# Patient Record
Sex: Female | Born: 1955 | Race: White | Hispanic: No | Marital: Married | Smoking: Never smoker
Health system: Southern US, Community
[De-identification: ages and names within clinical notes are randomized; demographics above are authoritative.]

## PROBLEM LIST (undated history)

## (undated) DIAGNOSIS — I471 Supraventricular tachycardia, unspecified: Secondary | ICD-10-CM

## (undated) DIAGNOSIS — R202 Paresthesia of skin: Secondary | ICD-10-CM

## (undated) DIAGNOSIS — I480 Paroxysmal atrial fibrillation: Secondary | ICD-10-CM

## (undated) DIAGNOSIS — N644 Mastodynia: Secondary | ICD-10-CM

## (undated) DIAGNOSIS — I10 Essential (primary) hypertension: Secondary | ICD-10-CM

## (undated) DIAGNOSIS — M25512 Pain in left shoulder: Secondary | ICD-10-CM

## (undated) DIAGNOSIS — G5760 Lesion of plantar nerve, unspecified lower limb: Secondary | ICD-10-CM

## (undated) DIAGNOSIS — Z780 Asymptomatic menopausal state: Secondary | ICD-10-CM

## (undated) DIAGNOSIS — M79673 Pain in unspecified foot: Secondary | ICD-10-CM

## (undated) DIAGNOSIS — C44599 Other specified malignant neoplasm of skin of other part of trunk: Secondary | ICD-10-CM

## (undated) DIAGNOSIS — Z9289 Personal history of other medical treatment: Secondary | ICD-10-CM

## (undated) HISTORY — DX: Pain in unspecified foot: M79.673

## (undated) HISTORY — DX: Asymptomatic menopausal state: Z78.0

## (undated) HISTORY — DX: Paroxysmal atrial fibrillation: I48.0

## (undated) HISTORY — DX: Personal history of other medical treatment: Z92.89

## (undated) HISTORY — DX: Pain in left shoulder: M25.512

## (undated) HISTORY — DX: Mastodynia: N64.4

## (undated) HISTORY — DX: Lesion of plantar nerve, unspecified lower limb: G57.60

## (undated) HISTORY — DX: Other specified malignant neoplasm of skin of other part of trunk: C44.599

## (undated) HISTORY — DX: Paresthesia of skin: R20.2

## (undated) HISTORY — DX: Essential (primary) hypertension: I10

## (undated) HISTORY — PX: OTHER SURGICAL HISTORY: SHX169

---

## 1975-05-07 HISTORY — PX: WISDOM TOOTH EXTRACTION: SHX21

## 2005-07-01 ENCOUNTER — Ambulatory Visit: Payer: Self-pay | Admitting: Sports Medicine

## 2005-08-08 ENCOUNTER — Ambulatory Visit: Payer: Self-pay | Admitting: Family Medicine

## 2005-09-16 ENCOUNTER — Ambulatory Visit: Payer: Self-pay | Admitting: Family Medicine

## 2005-09-23 ENCOUNTER — Encounter: Admission: RE | Admit: 2005-09-23 | Discharge: 2005-09-23 | Payer: Self-pay | Admitting: Sports Medicine

## 2005-10-01 ENCOUNTER — Ambulatory Visit: Payer: Self-pay | Admitting: Sports Medicine

## 2005-10-06 ENCOUNTER — Encounter (INDEPENDENT_AMBULATORY_CARE_PROVIDER_SITE_OTHER): Payer: Self-pay | Admitting: *Deleted

## 2005-10-06 LAB — CONVERTED CEMR LAB

## 2005-10-09 ENCOUNTER — Encounter: Payer: Self-pay | Admitting: Sports Medicine

## 2005-10-09 ENCOUNTER — Ambulatory Visit: Payer: Self-pay | Admitting: Sports Medicine

## 2006-01-21 ENCOUNTER — Ambulatory Visit: Payer: Self-pay | Admitting: Sports Medicine

## 2006-10-07 ENCOUNTER — Encounter: Admission: RE | Admit: 2006-10-07 | Discharge: 2006-10-07 | Payer: Self-pay | Admitting: Obstetrics and Gynecology

## 2006-10-09 ENCOUNTER — Encounter: Admission: RE | Admit: 2006-10-09 | Discharge: 2006-10-09 | Payer: Self-pay | Admitting: Sports Medicine

## 2006-10-09 ENCOUNTER — Ambulatory Visit (HOSPITAL_COMMUNITY): Admission: RE | Admit: 2006-10-09 | Discharge: 2006-10-09 | Payer: Self-pay | Admitting: Family Medicine

## 2006-10-09 ENCOUNTER — Encounter: Payer: Self-pay | Admitting: Family Medicine

## 2006-10-09 ENCOUNTER — Ambulatory Visit: Payer: Self-pay | Admitting: Family Medicine

## 2006-10-22 ENCOUNTER — Ambulatory Visit: Payer: Self-pay | Admitting: Sports Medicine

## 2006-10-28 ENCOUNTER — Ambulatory Visit (HOSPITAL_COMMUNITY): Admission: RE | Admit: 2006-10-28 | Discharge: 2006-10-28 | Payer: Self-pay | Admitting: Sports Medicine

## 2006-12-04 ENCOUNTER — Encounter (INDEPENDENT_AMBULATORY_CARE_PROVIDER_SITE_OTHER): Payer: Self-pay | Admitting: *Deleted

## 2006-12-18 ENCOUNTER — Encounter (INDEPENDENT_AMBULATORY_CARE_PROVIDER_SITE_OTHER): Payer: Self-pay | Admitting: *Deleted

## 2007-01-13 ENCOUNTER — Encounter: Payer: Self-pay | Admitting: Sports Medicine

## 2007-01-22 ENCOUNTER — Encounter: Payer: Self-pay | Admitting: Sports Medicine

## 2007-01-23 ENCOUNTER — Encounter: Admission: RE | Admit: 2007-01-23 | Discharge: 2007-01-23 | Payer: Self-pay | Admitting: Specialist

## 2007-02-23 ENCOUNTER — Telehealth: Payer: Self-pay | Admitting: Sports Medicine

## 2007-02-24 ENCOUNTER — Encounter: Payer: Self-pay | Admitting: Sports Medicine

## 2007-03-11 ENCOUNTER — Telehealth: Payer: Self-pay | Admitting: Sports Medicine

## 2007-07-12 ENCOUNTER — Telehealth (INDEPENDENT_AMBULATORY_CARE_PROVIDER_SITE_OTHER): Payer: Self-pay | Admitting: *Deleted

## 2007-08-04 ENCOUNTER — Telehealth: Payer: Self-pay | Admitting: *Deleted

## 2007-08-06 ENCOUNTER — Encounter: Admission: RE | Admit: 2007-08-06 | Discharge: 2007-08-06 | Payer: Self-pay | Admitting: Sports Medicine

## 2007-10-07 LAB — CONVERTED CEMR LAB: Pap Smear: NORMAL

## 2007-10-11 ENCOUNTER — Encounter: Admission: RE | Admit: 2007-10-11 | Discharge: 2007-10-11 | Payer: Self-pay | Admitting: Sports Medicine

## 2007-11-24 ENCOUNTER — Inpatient Hospital Stay (HOSPITAL_COMMUNITY): Admission: AD | Admit: 2007-11-24 | Discharge: 2007-11-24 | Payer: Self-pay | Admitting: Gynecology

## 2007-11-25 ENCOUNTER — Inpatient Hospital Stay (HOSPITAL_COMMUNITY): Admission: AD | Admit: 2007-11-25 | Discharge: 2007-11-25 | Payer: Self-pay | Admitting: Gynecology

## 2007-11-26 ENCOUNTER — Inpatient Hospital Stay (HOSPITAL_COMMUNITY): Admission: AD | Admit: 2007-11-26 | Discharge: 2007-11-26 | Payer: Self-pay | Admitting: Gynecology

## 2008-04-04 ENCOUNTER — Ambulatory Visit (HOSPITAL_COMMUNITY): Admission: RE | Admit: 2008-04-04 | Discharge: 2008-04-04 | Payer: Self-pay | Admitting: Obstetrics and Gynecology

## 2008-04-08 ENCOUNTER — Other Ambulatory Visit: Payer: Self-pay | Admitting: Emergency Medicine

## 2008-04-08 ENCOUNTER — Inpatient Hospital Stay (HOSPITAL_COMMUNITY): Admission: AD | Admit: 2008-04-08 | Discharge: 2008-04-09 | Payer: Self-pay | Admitting: Obstetrics and Gynecology

## 2008-06-06 HISTORY — PX: POLYPECTOMY: SHX149

## 2008-07-06 ENCOUNTER — Other Ambulatory Visit: Payer: Self-pay | Admitting: Obstetrics and Gynecology

## 2008-07-07 ENCOUNTER — Encounter (INDEPENDENT_AMBULATORY_CARE_PROVIDER_SITE_OTHER): Payer: Self-pay | Admitting: Obstetrics and Gynecology

## 2008-07-07 ENCOUNTER — Inpatient Hospital Stay (HOSPITAL_COMMUNITY): Admission: AD | Admit: 2008-07-07 | Discharge: 2008-07-10 | Payer: Self-pay | Admitting: Obstetrics and Gynecology

## 2008-07-11 ENCOUNTER — Encounter: Admission: RE | Admit: 2008-07-11 | Discharge: 2008-08-10 | Payer: Self-pay | Admitting: Obstetrics and Gynecology

## 2008-08-11 ENCOUNTER — Encounter: Admission: RE | Admit: 2008-08-11 | Discharge: 2008-09-09 | Payer: Self-pay | Admitting: Obstetrics and Gynecology

## 2008-09-05 HISTORY — PX: PARS PLANA VITRECTOMY W/ REPAIR OF MACULAR HOLE: SHX2170

## 2008-09-10 ENCOUNTER — Encounter: Admission: RE | Admit: 2008-09-10 | Discharge: 2008-10-10 | Payer: Self-pay | Admitting: Obstetrics and Gynecology

## 2008-10-11 ENCOUNTER — Encounter: Admission: RE | Admit: 2008-10-11 | Discharge: 2008-11-10 | Payer: Self-pay | Admitting: Obstetrics and Gynecology

## 2008-11-06 HISTORY — PX: CATARACT EXTRACTION: SUR2

## 2008-11-11 ENCOUNTER — Encounter: Admission: RE | Admit: 2008-11-11 | Discharge: 2008-12-08 | Payer: Self-pay | Admitting: Obstetrics and Gynecology

## 2008-12-09 ENCOUNTER — Encounter: Admission: RE | Admit: 2008-12-09 | Discharge: 2009-01-08 | Payer: Self-pay | Admitting: Obstetrics and Gynecology

## 2009-01-09 ENCOUNTER — Encounter: Admission: RE | Admit: 2009-01-09 | Discharge: 2009-02-07 | Payer: Self-pay | Admitting: Obstetrics and Gynecology

## 2009-01-15 ENCOUNTER — Ambulatory Visit: Payer: Self-pay | Admitting: Internal Medicine

## 2009-01-15 DIAGNOSIS — R000 Tachycardia, unspecified: Secondary | ICD-10-CM | POA: Insufficient documentation

## 2009-01-15 DIAGNOSIS — R209 Unspecified disturbances of skin sensation: Secondary | ICD-10-CM | POA: Insufficient documentation

## 2009-01-15 DIAGNOSIS — M79609 Pain in unspecified limb: Secondary | ICD-10-CM | POA: Insufficient documentation

## 2009-01-15 DIAGNOSIS — R238 Other skin changes: Secondary | ICD-10-CM | POA: Insufficient documentation

## 2009-01-15 DIAGNOSIS — I10 Essential (primary) hypertension: Secondary | ICD-10-CM | POA: Insufficient documentation

## 2009-01-21 LAB — CONVERTED CEMR LAB
ALT: 19 units/L (ref 0–35)
AST: 23 units/L (ref 0–37)
Albumin: 4.1 g/dL (ref 3.5–5.2)
Alkaline Phosphatase: 65 units/L (ref 39–117)
BUN: 13 mg/dL (ref 6–23)
Basophils Absolute: 0 10*3/uL (ref 0.0–0.1)
Basophils Relative: 0.2 % (ref 0.0–3.0)
Bilirubin, Direct: 0.2 mg/dL (ref 0.0–0.3)
CO2: 30 meq/L (ref 19–32)
Calcium: 9.2 mg/dL (ref 8.4–10.5)
Chloride: 109 meq/L (ref 96–112)
Cholesterol: 204 mg/dL — ABNORMAL HIGH (ref 0–200)
Creatinine, Ser: 0.8 mg/dL (ref 0.4–1.2)
Direct LDL: 127.1 mg/dL
Eosinophils Absolute: 0.1 10*3/uL (ref 0.0–0.7)
Eosinophils Relative: 2.3 % (ref 0.0–5.0)
Folate: 18.1 ng/mL
Free T4: 0.6 ng/dL (ref 0.6–1.6)
GFR calc non Af Amer: 79.81 mL/min (ref >60)
Glucose, Bld: 91 mg/dL (ref 70–99)
HCT: 41.7 % (ref 36.0–46.0)
HDL: 45.6 mg/dL (ref >39.00)
Hemoglobin: 14.3 g/dL (ref 12.0–15.0)
Hgb A1c MFr Bld: 5.7 % (ref 4.6–6.5)
Lymphocytes Relative: 29 % (ref 12.0–46.0)
Lymphs Abs: 1.5 10*3/uL (ref 0.7–4.0)
MCHC: 34.3 g/dL (ref 30.0–36.0)
MCV: 92.5 fL (ref 78.0–100.0)
Monocytes Absolute: 0.4 10*3/uL (ref 0.1–1.0)
Monocytes Relative: 8.1 % (ref 3.0–12.0)
Neutro Abs: 3.2 10*3/uL (ref 1.4–7.7)
Neutrophils Relative %: 60.4 % (ref 43.0–77.0)
Platelets: 209 10*3/uL (ref 150.0–400.0)
Potassium: 3.9 meq/L (ref 3.5–5.1)
RBC: 4.51 M/uL (ref 3.87–5.11)
RDW: 13.1 % (ref 11.5–14.6)
Sed Rate: 12 mm/hr (ref 0–22)
Sodium: 145 meq/L (ref 135–145)
TSH: 2.02 microintl units/mL (ref 0.35–5.50)
Total Bilirubin: 0.8 mg/dL (ref 0.3–1.2)
Total CHOL/HDL Ratio: 4
Total Protein: 7.5 g/dL (ref 6.0–8.3)
Triglycerides: 150 mg/dL — ABNORMAL HIGH (ref 0.0–149.0)
VLDL: 30 mg/dL (ref 0.0–40.0)
Vitamin B-12: 575 pg/mL (ref 211–911)
WBC: 5.2 10*3/uL (ref 4.5–10.5)

## 2009-01-30 ENCOUNTER — Ambulatory Visit: Payer: Self-pay | Admitting: Internal Medicine

## 2009-01-30 ENCOUNTER — Other Ambulatory Visit: Admission: RE | Admit: 2009-01-30 | Discharge: 2009-01-30 | Payer: Self-pay | Admitting: Internal Medicine

## 2009-01-30 ENCOUNTER — Encounter: Payer: Self-pay | Admitting: Internal Medicine

## 2009-01-31 ENCOUNTER — Encounter: Payer: Self-pay | Admitting: Internal Medicine

## 2009-02-08 ENCOUNTER — Encounter: Admission: RE | Admit: 2009-02-08 | Discharge: 2009-03-08 | Payer: Self-pay | Admitting: Obstetrics and Gynecology

## 2009-02-21 ENCOUNTER — Telehealth: Payer: Self-pay | Admitting: Internal Medicine

## 2009-07-01 ENCOUNTER — Encounter: Payer: Self-pay | Admitting: Internal Medicine

## 2009-07-05 ENCOUNTER — Telehealth: Payer: Self-pay | Admitting: Internal Medicine

## 2009-07-05 ENCOUNTER — Encounter: Payer: Self-pay | Admitting: *Deleted

## 2009-08-16 ENCOUNTER — Telehealth: Payer: Self-pay | Admitting: Internal Medicine

## 2009-08-17 ENCOUNTER — Ambulatory Visit: Payer: Self-pay | Admitting: Internal Medicine

## 2009-08-17 DIAGNOSIS — Z780 Asymptomatic menopausal state: Secondary | ICD-10-CM | POA: Insufficient documentation

## 2009-08-17 DIAGNOSIS — N644 Mastodynia: Secondary | ICD-10-CM | POA: Insufficient documentation

## 2009-08-24 ENCOUNTER — Encounter: Admission: RE | Admit: 2009-08-24 | Discharge: 2009-08-24 | Payer: Self-pay | Admitting: Internal Medicine

## 2009-08-27 ENCOUNTER — Encounter: Admission: RE | Admit: 2009-08-27 | Discharge: 2009-08-27 | Payer: Self-pay | Admitting: Internal Medicine

## 2009-08-27 ENCOUNTER — Encounter (INDEPENDENT_AMBULATORY_CARE_PROVIDER_SITE_OTHER): Payer: Self-pay | Admitting: *Deleted

## 2009-09-19 ENCOUNTER — Telehealth: Payer: Self-pay | Admitting: *Deleted

## 2010-01-11 ENCOUNTER — Ambulatory Visit: Payer: Self-pay | Admitting: Internal Medicine

## 2010-01-11 DIAGNOSIS — M25519 Pain in unspecified shoulder: Secondary | ICD-10-CM | POA: Insufficient documentation

## 2010-01-11 LAB — CONVERTED CEMR LAB
Bilirubin Urine: NEGATIVE
Blood in Urine, dipstick: NEGATIVE
Glucose, Urine, Semiquant: NEGATIVE
Ketones, urine, test strip: NEGATIVE
Nitrite: NEGATIVE
Protein, U semiquant: NEGATIVE
Specific Gravity, Urine: 1.015
Urobilinogen, UA: 0.2
WBC Urine, dipstick: NEGATIVE
pH: 7

## 2010-01-14 LAB — CONVERTED CEMR LAB
ALT: 19 units/L (ref 0–35)
AST: 20 units/L (ref 0–37)
Albumin: 4.2 g/dL (ref 3.5–5.2)
Alkaline Phosphatase: 68 units/L (ref 39–117)
BUN: 13 mg/dL (ref 6–23)
Basophils Absolute: 0 10*3/uL (ref 0.0–0.1)
Basophils Relative: 0.4 % (ref 0.0–3.0)
Bilirubin, Direct: 0 mg/dL (ref 0.0–0.3)
CO2: 30 meq/L (ref 19–32)
Calcium: 9.3 mg/dL (ref 8.4–10.5)
Chloride: 106 meq/L (ref 96–112)
Cholesterol: 212 mg/dL — ABNORMAL HIGH (ref 0–200)
Creatinine, Ser: 0.7 mg/dL (ref 0.4–1.2)
Direct LDL: 149.6 mg/dL
Eosinophils Absolute: 0.1 10*3/uL (ref 0.0–0.7)
Eosinophils Relative: 2.5 % (ref 0.0–5.0)
GFR calc non Af Amer: 92.75 mL/min (ref >60)
Glucose, Bld: 91 mg/dL (ref 70–99)
HCT: 43 % (ref 36.0–46.0)
HDL: 51.4 mg/dL (ref >39.00)
Hemoglobin: 14.4 g/dL (ref 12.0–15.0)
Lymphocytes Relative: 34.3 % (ref 12.0–46.0)
Lymphs Abs: 1.7 10*3/uL (ref 0.7–4.0)
MCHC: 33.5 g/dL (ref 30.0–36.0)
MCV: 91.4 fL (ref 78.0–100.0)
Monocytes Absolute: 0.4 10*3/uL (ref 0.1–1.0)
Monocytes Relative: 9 % (ref 3.0–12.0)
Neutro Abs: 2.6 10*3/uL (ref 1.4–7.7)
Neutrophils Relative %: 53.8 % (ref 43.0–77.0)
Platelets: 215 10*3/uL (ref 150.0–400.0)
Potassium: 4.3 meq/L (ref 3.5–5.1)
RBC: 4.7 M/uL (ref 3.87–5.11)
RDW: 14 % (ref 11.5–14.6)
Sodium: 142 meq/L (ref 135–145)
TSH: 1.97 microintl units/mL (ref 0.35–5.50)
Total Bilirubin: 0.5 mg/dL (ref 0.3–1.2)
Total CHOL/HDL Ratio: 4
Total Protein: 7.4 g/dL (ref 6.0–8.3)
Triglycerides: 100 mg/dL (ref 0.0–149.0)
VLDL: 20 mg/dL (ref 0.0–40.0)
WBC: 4.9 10*3/uL (ref 4.5–10.5)

## 2010-01-21 ENCOUNTER — Encounter: Payer: Self-pay | Admitting: Internal Medicine

## 2010-01-23 ENCOUNTER — Encounter: Payer: Self-pay | Admitting: Internal Medicine

## 2010-01-30 ENCOUNTER — Ambulatory Visit: Payer: Self-pay | Admitting: Internal Medicine

## 2010-01-30 ENCOUNTER — Other Ambulatory Visit: Admission: RE | Admit: 2010-01-30 | Discharge: 2010-01-30 | Payer: Self-pay | Admitting: Internal Medicine

## 2010-01-30 DIAGNOSIS — G5760 Lesion of plantar nerve, unspecified lower limb: Secondary | ICD-10-CM | POA: Insufficient documentation

## 2010-02-01 ENCOUNTER — Telehealth: Payer: Self-pay | Admitting: Internal Medicine

## 2010-02-04 LAB — CONVERTED CEMR LAB: Pap Smear: NEGATIVE

## 2010-02-06 ENCOUNTER — Ambulatory Visit: Payer: Self-pay | Admitting: Cardiology

## 2010-02-06 ENCOUNTER — Ambulatory Visit: Payer: Self-pay

## 2010-02-06 ENCOUNTER — Ambulatory Visit: Payer: Self-pay | Admitting: Internal Medicine

## 2010-03-05 ENCOUNTER — Telehealth: Payer: Self-pay | Admitting: *Deleted

## 2010-04-02 ENCOUNTER — Ambulatory Visit: Payer: Self-pay | Admitting: Internal Medicine

## 2010-04-02 DIAGNOSIS — M26629 Arthralgia of temporomandibular joint, unspecified side: Secondary | ICD-10-CM | POA: Insufficient documentation

## 2010-04-09 ENCOUNTER — Telehealth: Payer: Self-pay | Admitting: Internal Medicine

## 2010-07-09 ENCOUNTER — Ambulatory Visit (HOSPITAL_COMMUNITY): Admission: RE | Admit: 2010-07-09 | Discharge: 2010-07-09 | Payer: Self-pay | Admitting: Obstetrics and Gynecology

## 2010-07-10 ENCOUNTER — Encounter: Payer: Self-pay | Admitting: Internal Medicine

## 2010-07-19 ENCOUNTER — Telehealth: Payer: Self-pay | Admitting: *Deleted

## 2010-07-23 ENCOUNTER — Ambulatory Visit: Payer: Self-pay | Admitting: Internal Medicine

## 2010-08-05 ENCOUNTER — Telehealth: Payer: Self-pay | Admitting: *Deleted

## 2010-08-06 ENCOUNTER — Telehealth: Payer: Self-pay | Admitting: *Deleted

## 2010-08-23 ENCOUNTER — Telehealth: Payer: Self-pay | Admitting: *Deleted

## 2010-08-26 ENCOUNTER — Encounter: Admission: RE | Admit: 2010-08-26 | Discharge: 2010-08-26 | Payer: Self-pay | Admitting: Internal Medicine

## 2010-08-27 ENCOUNTER — Encounter: Payer: Self-pay | Admitting: Internal Medicine

## 2010-08-27 ENCOUNTER — Encounter: Payer: Self-pay | Admitting: *Deleted

## 2010-11-05 NOTE — Progress Notes (Signed)
Summary: LTR OF CLEARANCE  Phone Note Other Incoming Call back at [phone]   Call placed by: AISHA @ [facility] Summary of Call: NEED LETTER OF MEDICAL CLEARANCE FROM [facility]  Follow-up for Phone Call        faxed to [phone] Follow-up by: Lillia Pauls CMA,  July 14, 2007 9:15 AM

## 2010-11-05 NOTE — Progress Notes (Signed)
Summary: Needs a note saying that it's okay to get pg  Phone Note From Other Clinic   Caller: [facility] in [place] [employer] of Call: Pt needs to have a short note saying that it is okay for her to get preg. They also need a prolactin level as well as a urine GC/Chly test done. I spoke to pt and she said that Dr. Enid Skeens office has already done this and they will be faxing all this to [place] as well as to them. So all we need is just the short note about her being okay to get pg. Initial call taken by: Romualdo Bolk, CMA Duncan Dull),  August 23, 2010 11:31 AM  Follow-up for Phone Call        ok   see letter to send Follow-up by: Madelin Headings MD,  August 27, 2010 1:03 PM  Additional Follow-up for Phone Call Additional follow up Details #1::        Note faxed Additional Follow-up by: Romualdo Bolk, CMA Duncan Dull),  August 27, 2010 1:44 PM

## 2010-11-05 NOTE — Progress Notes (Signed)
Summary: triage  Phone Note Call from Patient Call back at Home Phone [phone]   Reason for Call: Talk to Nurse Summary of Call: pt is requesting to speak with someone re: a test she thinks she needs to have done. she sts she is getting ready for invetro and now her breast are sore and one of her doctors advised her to have an ultrasonography, please advise? Initial call taken by: ERIN LEVAN,  August 04, 2007 3:41 PM  Follow-up for Phone Call        IVF planned with a clinic in [place]. Has been on multiple meds to prep her for this. Suspended meds pending a test to check out tender breasts as it may be a negative effect of the meds she has been on.  Had a mammogram in 1/08. C/o tender breasts. IVF doctor wants her to have an ultrasonography has been to place locally for last mammogram. she will call them to get it arranged. wants it Thursday 10/30. if they cannot accomodate her she will try [facility] or [place]. If the mammogram places need an order, told her to have the md who asked her to get it, fax or call in an order for it. Pt agreed with plan and will call to arrange appt Follow-up by: Golden Circle RN,  August 04, 2007 3:46 PM  Additional Follow-up for Phone Call Additional follow up Details #1::        needs an order from md faxed to [phone] at [facility] for a  "diagnostic mammogram" wants this asap so she can get it done Friday Additional Follow-up by: Golden Circle RN,  August 04, 2007 4:48 PM    Additional Follow-up for Phone Call Additional follow up Details #2::    told her md could not write order for diagnostic. Has not seen her recently & has no record of a problem. can get a regular mammogram & then if there are findings, she can get a diagnostic. She states she called the place & was able to convince them to do a diagnostic Friday afternoon w/o a doctor order. Follow-up by: Golden Circle RN,  August 05, 2007 9:09 AM

## 2010-11-05 NOTE — Progress Notes (Signed)
Summary: needs EKG and Stress Test  Phone Note Call from Patient   Summary of Call: Pt needs Stress test and EKG to finish up her In the [facility]ertility Program..........Marland KitchenNEEDS it ASAP as she is attempting to get an egg from the surrogate that carried her last child and they would be biological siblings.  Surrogate only has one more cycle to do attempt this. [phone] Initial call taken by: Lynann Beaver CMA,  February 01, 2010 2:13 PM  Follow-up for Phone Call        Spoke to pt and she said that she just needed a treadmill test. Pt to come in on Wed for EKG and order sent for Stress test to [facility]. Follow-up by: Romualdo Bolk, CMA Duncan Dull),  February 01, 2010 2:29 PM

## 2010-11-05 NOTE — Progress Notes (Signed)
Summary: pt req copy of mammogram and pap. Also wants to order bloodwork  Phone Note Call from Patient Call back at [facility] Phone [phone]   Caller: Patient Summary of Call: Pt req copy of mammogram and pap. Please fax to pt [phone]. Pt also would like to req to get a hormone lvl check (bloodwork) including estradol, progesterone,LH and Beta Hcg. Please advise.  Initial call taken by: Lucy Antigua,  September 19, 2009 3:29 PM  Follow-up for Phone Call        copy ofpap and mammo to her. also need a reason or diagnosis code for reason for the labs  to be done . please call and get more info. Follow-up by: Madelin Headings MD,  September 20, 2009 8:18 AM  Additional Follow-up for Phone Call Additional follow up Details #1::        Pt states that she got the labs done thru her gyn. Pt would like this faxed to Dr. Ilda Foil Fax [phone].  Info faxed to pt and md. Additional Follow-up by: Romualdo Bolk, CMA (AAMA),  September 21, 2009 10:17 AM
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
## 2010-11-05 NOTE — Assessment & Plan Note (Signed)
Summary: shoulder and arm pain /dm   Vital Signs:  Patient profile:   55 year old female Menstrual status:  postmenopausal Height:      66.75 inches Weight:      216 pounds BMI:     34.21 Pulse rate:   78 / minute BP sitting:   120 / 80  (right arm) Cuff size:   regular  Vitals Entered By: Romualdo Bolk, CMA (AAMA) (January 11, 2010 10:58 AM) CC: Left shoulder pain that has sharp pains when she moves it a certain way. This has been going on for 2-3 weeks. During the daytime it is uncomfortable but hurts worse at night. Pt wanting to go off labetalol since she is going to try to get preg. Pt has a slightly abnormal ana. She would like a referal to rheum. She does have a slight bulgling disc in her back.   History of Present Illness: Alison Matthews comesin today for     above acute problem but wants to schedule  cpx while here.   She is having left shoulder pain and radiation to arm for  weeks  . doesnt want to be   Ignoring  a serious  problem  .  Does lift her   44 month  old child  angela  with that arm but had    onset after reacing back in car seat . Was sore and now getting better  but persistent . NO sig weakness .  She is now  taking     a week off to  go to a spa  to considering  getting pregnant again.       in the early summer .   TOre rotater cuff   4 years ago on the right    And was rx with physical therapy . and then got better with time and therapy  Context  ache   all the time  but at night is worse    on side  pain from pain.     AN shooting pains   and ha No asociated cp sob NVsweatting . No exercise intolerance limited but various aches and painsl OTher improtant facts  Neurologist : DX in the past   slight positive ana    monitoring.      felt not important. ? amyloid.  No evidence of such . MRI   bulging  disc in back and neg ncs   .   But has difficulty with ball of foot   .POss mortons   has used padding in shoe still a problem.  Preventive  Screening-Counseling & Management  Alcohol-Tobacco     Alcohol drinks/day: 0     Smoking Status: never  Caffeine-Diet-Exercise     Caffeine use/day: 1 every 3 days     Does Patient Exercise: no  Current Medications (verified): 1)  Labetalol Hcl 200 Mg Tabs (Labetalol Hcl) .... 1/2 By Mouth Two Times A Day 2)  Motrin Ib 200 Mg Tabs (Ibuprofen)  Allergies (verified): No Known Drug Allergies  Past History:  Past medical, surgical, family and social histories (including risk factors) reviewed, and no changes noted (except as noted below).  Past Medical History: Reviewed history from 01/15/2009 and no changes required. Postmenopausal- Used Egg Donor and hormonal therapy for child G1P1  Hypertension- Pregnancy related Had First Child at age 54   10/2 /09    Past Surgical History: Reviewed history from 01/15/2009 and no changes required. 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 Henderson Cloud, [place]  Ophthalmology:[facility]   Orthopedics: [facility]  Family History: Reviewed history from 01/15/2009 and no changes required. 3 younger sisters A&W, father age 82 ? Hbp, mother age 78 osteoporosis Father: HBP, arthristis Mother:  Osteoporosis Siblings: 3 younger sister- one had bx in breast others healthy.  Social History: Reviewed history from 01/15/2009 and no changes required. consultant with CPA background; married dennis Mangham - Psychologist, forensic; non smoker; rare alcohol. Daughter Marylene Land  born in October 09  Sleep  deprived.  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   Child now is 18 months and gets help with a nanny  Planning another egg donor post menopausal pregnancy.  Orig from [place]  moved from [place]  4 years ago.   Review of Systems  The patient denies anorexia, fever, weight loss,  vision loss, decreased hearing, hoarseness, chest pain, syncope, dyspnea on exertion, peripheral edema, prolonged cough, abdominal pain, melena, hematochezia, severe indigestion/heartburn, muscle weakness, transient blindness, difficulty walking, unusual weight change, abnormal bleeding, enlarged lymph nodes, and angioedema.    Physical Exam  General:  Well-developed,well-nourished,in no acute distress; alert,appropriate and cooperative throughout examination Head:  normocephalic and atraumatic.   Neck:  No deformities, masses, or tenderness noted. Chest Wall:  no tenderness.   Lungs:  Normal respiratory effort, chest expands symmetrically. Lungs are clear to auscultation, no crackles or wheezes. Heart:  Normal rate and regular rhythm. S1 and S2 normal without gallop, murmur, click, rub or other extra sounds.no lifts.   Abdomen:  Bowel sounds positive,abdomen soft and non-tender without masses, organomegaly or noted. Msk:  no joint warmth and no redness over joints.   left shoulder  with discomfort intern and exst rotation.   ball of foot without callus  no point tenderness  Pulses:  pulses intact without delay   Extremities:  no clubbing cyanosis or edema  Neurologic:  alert & oriented X3, strength normal in all extremities, and gait normal.   Skin:  turgor normal, no ecchymoses, and no petechiae.   Cervical Nodes:  no anterior cervical adenopathy and no posterior cervical adenopathy.   Axillary Nodes:  no L axillary adenopathy.   Psych:  Oriented X3, good eye contact, not anxious appearing, and not depressed appearing.     Impression & Recommendations:  Problem # 1:  SHOULDER PAIN, LEFT (ICD-719.41) this does not appear cardiac but MS in nature   with onset context and aggravating factors .    disc rx and Expectant management  Her updated medication list for this problem includes:    Motrin Ib 200 Mg Tabs (Ibuprofen)    Naproxen 500 Mg Tabs (Naproxen) .Marland Kitchen... 1 by mouth two times a day  for  shoulder pain.  Problem # 2:  HYPERTENSION (ICD-401.9) ok today but  disc nsaid and other factors such as weight age inactivity .  Her updated medication list for this problem includes:    Labetalol Hcl 200 Mg Tabs (Labetalol hcl) .Marland Kitchen... 1/2 by mouth two times a day  BP today: 120/80 Prior BP: 110/70 (08/17/2009)  Prior 10 Yr Risk Heart Disease: Not enough information (08/17/2009)  Labs Reviewed: K+: 3.9 (01/15/2009) Creat: : 0.8 (01/15/2009)   Chol: 204 (01/15/2009)   HDL: 45.60 (01/15/2009)   TG: 150.0 (01/15/2009)  Problem # 3:  ASYMPTOMATIC POSTMENOPAUSAL STATUS (ICD-V49.81) she is planning  to undergo  another pregnancy    agree that she should lose weight  her current ortho signs will get worse with preg and  and her BP could be  problematic and lifestyle intervention could help.   Complete Medication List: 1)  Labetalol Hcl 200 Mg Tabs (Labetalol hcl) .... 1/2 by mouth two times a day 2)  Motrin Ib 200 Mg Tabs (Ibuprofen) 3)  Naproxen 500 Mg Tabs (Naproxen) .Marland Kitchen.. 1 by mouth two times a day for  shoulder pain.  Other Orders: Venipuncture (95621) UA Dipstick w/o Micro (automated)  (81003) TLB-Lipid Panel (80061-LIPID) TLB-Hepatic/Liver Function Pnl (80076-HEPATIC) TLB-TSH (Thyroid Stimulating Hormone) (84443-TSH) TLB-CBC Platelet - w/Differential (85025-CBCD) TLB-BMP (Basic Metabolic Panel-BMET) (80048-METABOL)  Patient Instructions: 1)  NSAID   three times a day for up to 3 weeks for your shoulder . 2)   I think this is tendonitis bursitis.  3)  Continue BP   meds  for now    untill off med for shoulder  before stopping. 4)  Consider seeing podiatry  Dr Alison Murray  for feet and shoulder  Consider seeing Dr Rennis Chris  .  Prescriptions: NAPROXEN 500 MG TABS (NAPROXEN) 1 by mouth two times a day for  shoulder pain.  #60 x 1   Entered and Authorized by:   Madelin Headings MD   Signed by:   Madelin Headings MD on 01/11/2010   Method used:   Electronically to         [facility]. [phone]* (retail)       [address]       [place], [place]  [address]       Ph: [phone]       Fax: [phone]   RxID:   [id number]   Laboratory Results   Urine Tests    Routine Urinalysis   Color: yellow Appearance: Clear Glucose: negative   (Normal Range: Negative) Bilirubin: negative   (Normal Range: Negative) Ketone: negative   (Normal Range: Negative) Spec. Gravity: 1.015   (Normal Range: 1.003-1.035) Blood: negative   (Normal Range: Negative) pH: 7.0   (Normal Range: 5.0-8.0) Protein: negative   (Normal Range: Negative) Urobilinogen: 0.2   (Normal Range: 0-1) Nitrite: negative   (Normal Range: Negative) Leukocyte Esterace: negative   (Normal Range: Negative)    Comments: Rita Ohara  January 11, 2010 12:48 PM

## 2010-11-05 NOTE — Letter (Signed)
Summary: Generic Letter  [facility]  [address] [place], [place] [address]   Phone: [phone]  Fax: [phone]    08/27/2010   Alison Matthews  DOB: 2056-08-17  To whom it may concern:  [name]  is a patient in our practice. There are no medical contraindications to pregnancy.  Her last visit/evaluation  with [place] was in June 2011.  Let us know if you require further information.             Sincerely,   Berniece Andreas MD

## 2010-11-05 NOTE — Miscellaneous (Signed)
Summary: Flu Vaccination/[facility]  Flu Vaccination/[facility]   Imported By: Maryln Gottron 07/12/2009 13:00:39  _____________________________________________________________________  External Attachment:    Type:   Image     Comment:   External Document

## 2010-11-05 NOTE — Assessment & Plan Note (Signed)
Summary: ekg only/ssc  Nurse Visit  CC: Pt came in for EKG only EKG normal sinus rhythm Nl intervals and nl axis.  WPanosh MD   Allergies: No Known Drug Allergies  Orders Added: 1)  EKG w/ Interpretation [93000]

## 2010-11-05 NOTE — Progress Notes (Signed)
Summary: orders for dexa  Phone Note Call from Patient   Caller: Patient Call For: Madelin Headings MD Summary of Call: Tenderness right side of breast and wants a diagnostic mammogram.  [facility].  also wants a Bone Density. Needs faxed orders.......  [phone] pt. [phone] Initial call taken by: Lynann Beaver CMA,  August 16, 2009 12:50 PM  Follow-up for Phone Call        needs ov   to check breast before can order a diagnostic mammo  can see  her tomorrow( any slot)  if she can come. Follow-up by: Madelin Headings MD,  August 16, 2009 4:32 PM  Additional Follow-up for Phone Call Additional follow up Details #1::        [facility] OV 08-17-2009 945AM Additional Follow-up by: Heron Sabins,  August 16, 2009 5:12 PM

## 2010-11-05 NOTE — Letter (Signed)
Summary: *Consult Note  [facility]  [address]   [place], [place] [address]   Phone: [phone]  Fax:     Re:    Alison Matthews DOB:    [birth year]   Dear Dr. Joelene Millin:   Thank you for requesting that we see the above patient for consultation.  A copy of the detailed office note will be sent under separate cover, for your review.  Evaluation today is consistent with: No serious cardiac disease.  Patient had full cardiac evaluation including an ETT by me which was unremarkable.  A cardiac evaluation by Dr. Peter Swaziland from our Cardiology service.  While her EKG shows minor prolongation of the QT segment this is a normal variant in up to 30% of individuals. We found no evidence of intrinsic heart disease that would preclude her from undergoing fertility treatment or other medical procedures.      Thank you for this consultation.  If you have any further questions regarding the care of this patient, please do not hesitate to contact me @ [phone].  Thank you for this opportunity to look after your patient.  Sincerely,      Enid Baas MD

## 2010-11-05 NOTE — Assessment & Plan Note (Signed)
Summary: cpx/ssc   Vital Signs:  Patient profile:   55 year old female Menstrual status:  postmenopausal Height:      66.5 inches Weight:      213 pounds Pulse rate:   72 / minute BP sitting:   110 / 70  (left arm) Cuff size:   large  Vitals Entered By: Romualdo Bolk, CMA (AAMA) (January 30, 2010 9:12 AM) CC: CPX with pap   History of Present Illness: Alison Matthews comesin comes in today  for preventive visit  in preparation  for possible  post menopausal  pregnancy .    Since last visit  here  there have been no major changes in health status  . Wants pap done here  instead of gyne.   Shoulder : helped by naprosyn  and saw  DR Supple  and  to follow up .  1/10   and  to do self   exercise  at home  and using trainer.    and saw dr Legrand Pitts dx mortons neuroma  and had  injection.    Feeling quite well after vaction. No cv pulm signs and exercising. BP has been good .       Preventive Care Screening  Prior Values:    Pap Smear:  NEGATIVE FOR INTRAEPITHELIAL LESIONS OR MALIGNANCY. (01/30/2009)    Mammogram:  BI-RADS CATEGORY 1:  Negative.^MM DIGITAL DIAGNOSTIC BILAT (08/24/2009)    Colonoscopy:  normal (10/06/2006)    Last Tetanus Booster:  Tdap (01/15/2009)   Preventive Screening-Counseling & Management  Alcohol-Tobacco     Alcohol drinks/day: 0     Smoking Status: never  Caffeine-Diet-Exercise     Caffeine use/day: 1 every 3 days     Does Patient Exercise: no  Hep-HIV-STD-Contraception     Dental Visit-last 6 months yes     Sun Exposure-Excessive: yes  Safety-Violence-Falls     Seat Belt Use: yes     Firearms in the Home: firearms in the home     Firearm Counseling: to practice firearm safety     Smoke Detectors: yes  Comments:  counseled  on no tanning  beds . Pt says she will think a bout it . " feels healthier with it"      Blood Transfusions:  no.    Current Medications (verified): 1)  Labetalol Hcl 200 Mg Tabs (Labetalol Hcl) .... 1/2 By Mouth  Two Times A Day 2)  Naproxen 500 Mg Tabs (Naproxen) .Marland Kitchen.. 1 By Mouth Two Times A Day For  Shoulder Pain. 3)  Prenatal Vitamins 0.8 Mg Tabs (Prenatal Multivit-Min-Fe-Fa)  Allergies (verified): No Known Drug Allergies  Past History:  Past medical, surgical, family and social histories (including risk factors) reviewed, and no changes noted (except as noted below).  Past Medical History: Postmenopausal- Used Egg Donor and hormonal therapy for child G1P1  Hypertension- Pregnancy related Had First Child at age 70   10/2 /09 Vision  left eye field cut.    Past Surgical History: Reviewed history from 01/15/2009 and no changes required. 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 Henderson Cloud, [facility]  Ophthalmology:[facility]   Orthopedics: [facility],  Podiatry: Dr. Forde Radon  Family History: Reviewed history from 01/15/2009 and no changes required. 3 younger sisters A&W, father age 89 ? Hbp, mother age 25 osteoporosis Father: HBP, arthristis Mother:  Osteoporosis Siblings: 3 younger  sister- one had bx in breast others healthy.  Social History: Reviewed history from 01/11/2010 and no changes required. consultant with CPA background; married dennis Amis - Psychologist, forensic; non smoker; rare alcohol. Daughter Marylene Land  born in October 09  home office  and had a Social worker. to help with child.   Child now is 18 months and gets help with a nanny  Planning another egg donor post menopausal pregnancy.  Orig from [place]  moved from [place]  4 years ago. Dental Care w/in 6 mos.:  yes Sun Exposure-Excessive:  yes Seat Belt Use:  yes Blood Transfusions:  no  Review of Systems  The patient denies anorexia, fever, weight loss, weight gain, decreased hearing, hoarseness, chest pain, syncope, dyspnea on exertion, peripheral edema,  prolonged cough, headaches, hemoptysis, abdominal pain, melena, hematochezia, severe indigestion/heartburn, hematuria, incontinence, genital sores, muscle weakness, suspicious skin lesions, transient blindness, difficulty walking, depression, unusual weight change, abnormal bleeding, enlarged lymph nodes, angioedema, and breast masses.         slight tenderness at c sec scar on left  Physical Exam General Appearance: well developed, well nourished, no acute distress Eyes: conjunctiva and lids normal, PERRLA, EOMI, WNL Ears, Nose, Mouth, Throat: TM clear, nares clear, oral exam WNL Neck: supple, no lymphadenopathy, no thyromegaly, no JVD Respiratory: clear to auscultation and percussion, respiratory effort normal Cardiovascular: regular rate and rhythm, S1-S2, no murmur, rub or gallop, no bruits, peripheral pulses normal and symmetric, no cyanosis, clubbing, edema or varicosities Chest: no scars, masses, tenderness; no asymmetry, skin changes, nipple discharge   Gastrointestinal: soft, non-tender; no hepatosplenomegaly, masses; active bowel sounds all quadrants, guaiac negative stool; no masses, tenderness, hemorrhoids  slight   tenderness at left C sec scar, no masses  Genitourinary: no vaginal discharge, lesions; no masses or tenderness  PAP done  Lymphatic: no cervical, axillary or inguinal adenopathy Musculoskeletal: gait normal, muscle tone and strength WNL, no joint swelling, effusions, discoloration,  good  shoulder ROM currently . Skin: clear, good turgor, color WNL, no rashes, lesions, or ulcerations Neurologic: normal mental status, normal reflexes, normal strength, sensation, and motion Psychiatric: alert; oriented to person, place and time Other Exam:  Labs normal   reviewed     Impression & Recommendations:  Problem # 1:  PREVENTIVE HEALTH CARE (ICD-V70.0) Discussed nutrition,exercise,diet,healthy weight, vitamin D and calcium.       Problem # 2:  ROUTINE GYNECOLOGICAL EXAM  (ICD-V72.31) nl exam pap done   Orders: Pap Smear, Thin Prep ( Collection of) ([id number])  Problem # 3:  SHOULDER PAIN, LEFT (ICD-719.41) Assessment: Improved tendinitis/  bursitis    The following medications were removed from the medication list:    Motrin Ib 200 Mg Tabs (Ibuprofen) Her updated medication list for this problem includes:    Naproxen 500 Mg Tabs (Naproxen) .Marland Kitchen... 1 by mouth two times a day for  shoulder pain.  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 weight loss and salt restriction could help . Should restart if BP elevated  Patient able to monitor at home. denies any sog CV PULM signs . Her updated medication list for this problem includes:    Labetalol Hcl 200 Mg Tabs (Labetalol hcl) .Marland Kitchen... 1/2 by mouth two times a day  Complete Medication List: 1)  Labetalol Hcl 200 Mg Tabs (Labetalol hcl) .... 1/2 by mouth two times a  day 2)  Naproxen 500 Mg Tabs (Naproxen) .Marland Kitchen.. 1 by mouth two times a day for  shoulder pain. 3)  Prenatal Vitamins 0.8 Mg Tabs (Prenatal multivit-min-fe-fa)  Patient Instructions: 1)  No tanning beds. 2)  this is unhealthy  as a risk factor for skin cancer .  3)   Stay on labetolol    while on naprosyn. 4)  Then can  wean and monitor your BP readings.  PAP/ labs  results to be sent to [facility] [phone].

## 2010-11-05 NOTE — Progress Notes (Signed)
Summary: Wants script for Labetolol  Phone Note Call from Patient   Summary of Call: Patient requesting to get a script written for Labetolol 200mg  so she can cut it in half and have twice the medication. Patient states this will save her money and time. Patient states if this can't be done can she have a script for her regular dose at a 90 day supply sent to  [facility]. Patient can be reached at [phone]. Initial call taken by: Darra Lis RMA,  July 05, 2009 1:12 PM  Follow-up for Phone Call        ok to do this  .   200 mg 1/2 bo two times a day  can give 30 or 90 day supply  . whichever works for her. (   6 months refill   total).  Follow-up by: Madelin Headings MD,  July 08, 2009 9:40 PM    New/Updated Medications: LABETALOL HCL 200 MG TABS (LABETALOL HCL) 1/2 by mouth two times a day Prescriptions: LABETALOL HCL 200 MG TABS (LABETALOL HCL) 1/2 by mouth two times a day  #180 x 3   Entered by:   Lynann Beaver CMA   Authorized by:   Madelin Headings MD   Signed by:   Lynann Beaver CMA on 07/09/2009   Method used:   Electronically to        [facility] (retail)       [address]       [place], [place]  [address]       Ph: [phone] or [phone]       Fax: [phone]   RxID:   [id number]  Pt notified.

## 2010-11-05 NOTE — Progress Notes (Signed)
Summary: REFILL REQUEST  Phone Note Refill Request Message from:  Patient on August 05, 2010 4:13 PM  Refills Requested: Medication #1:  LABETALOL HCL 200 MG TABS 1/2 by mouth two times a day   Notes: Development worker, community - W. Retail buyer.    Initial call taken by: Debbra Riding,  August 05, 2010 4:14 PM    Prescriptions: LABETALOL HCL 200 MG TABS (LABETALOL HCL) 1/2 by mouth two times a day  #180 x 0   Entered by:   Romualdo Bolk, CMA (AAMA)   Authorized by:   Madelin Headings MD   Signed by:   Romualdo Bolk, CMA (AAMA) on 08/05/2010   Method used:   Electronically to        [facility] [phone]* (retail)       [address]       [place], [place]  [address]       Ph: [phone]       Fax: [phone]   RxID:   [id number]

## 2010-11-05 NOTE — Consult Note (Signed)
Summary: [facility]   Imported By: Denny Peon LEVAN 01/22/2007 11:22:03  _____________________________________________________________________  External Attachment:    Type:   Image     Comment:   External Document

## 2010-11-05 NOTE — Letter (Signed)
Summary: Generic Letter  [facility] at [facility]  [address] [place], [place] [address]   Phone: [phone]  Fax: [phone]    08/27/2010    To Whom It May Concern,  It is okay for Alison Matthews, DOB 1955/10/27, to get pregnant. If you have any questions, please contact our office at [phone].           Sincerely,   Neta Mends. Fabian Sharp, MD

## 2010-11-05 NOTE — Assessment & Plan Note (Signed)
Summary: jaw pain x 6 days//ccm   Vital Signs:  Patient profile:   55 year old female Menstrual status:  postmenopausal Weight:      216 pounds Temp:     98.1 degrees F oral Pulse rate:   66 / minute BP sitting:   120 / 80  (left arm) Cuff size:   large  Vitals Entered By: Romualdo Bolk, CMA (AAMA) (April 02, 2010 2:48 PM) CC: Rt sided jaw pain that has moved up towards her ear. Pt states that she can't chew, yawn or cough on this side. This has been going on since 6/22   History of Present Illness: Alison Matthews comes in today  for sda  onset 6 days ago on vacation insidious onset   playing golf and  in sun with sunglasses.   tmj and  mandibular area. and then worse with chewing .   and hurts with  yawning.   also when bites down but no tooth pain.   No trauma. No uri signs   Preventive Screening-Counseling & Management  Alcohol-Tobacco     Alcohol drinks/day: 0     Smoking Status: never  Caffeine-Diet-Exercise     Caffeine use/day: 1 every 3 days     Does Patient Exercise: no  Current Medications (verified): 1)  Labetalol Hcl 200 Mg Tabs (Labetalol Hcl) .... 1/2 By Mouth Two Times A Day 2)  Naproxen 500 Mg Tabs (Naproxen) .Marland Kitchen.. 1 By Mouth Two Times A Day For  Shoulder Pain. 3)  Prenatal Vitamins 0.8 Mg Tabs (Prenatal Multivit-Min-Fe-Fa) .Marland Kitchen.. 1 Tab Once Daily 4)  Premarin 0.3 Mg Tabs (Estrogens Conjugated) .... Pt Unsure of Dosage 1 By Mouth Once Daily 5)  Provera 5 Mg Tabs (Medroxyprogesterone Acetate) .Marland Kitchen.. 1 By Mouth Once Daily For 10 Days  Allergies (verified): No Known Drug Allergies  Past History:  Past medical, surgical, family and social histories (including risk factors) reviewed, and no changes noted (except as noted below).  Past Medical History: Reviewed history from 02/05/2010 and no changes required. Postmenopausal- Used Egg Donor and hormonal therapy for child G1P1  Had First Child at age 11   10/2 /09 Vision  left eye field cut. 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 Henderson Cloud, [facility]  Ophthalmology:[facility]   Orthopedics: [facility],  Podiatry: Dr. Forde Radon  Family History: Reviewed history from 01/15/2009 and no changes required. 3 younger sisters A&W, father age 72 ? Hbp, mother age 105 osteoporosis Father: HBP, arthristis Mother:  Osteoporosis Siblings: 3 younger sister- one had bx in breast others healthy.  Social History: Reviewed history from 01/30/2010 and no changes required. consultant with CPA background; married dennis Plemons - Psychologist, forensic; non smoker; rare alcohol. Daughter Marylene Land  born in October 09  home office  and had a Social worker. to help with child.   Child now is 18 months and gets help with a nanny  Planning another egg donor post menopausal pregnancy.  Orig from [place]  moved from  [place]  4 years ago.   Review of Systems  The patient denies anorexia, fever, weight loss, weight gain, vision loss, decreased hearing, prolonged cough, headaches, enlarged lymph nodes, and angioedema.    Physical Exam  General:  Well-developed,well-nourished,in no acute distress; alert,appropriate and cooperative throughout examination Head:  normocephalic and atraumatic.     Eyes:  vision grossly intact, pupils equal, and pupils round.   Ears:  R ear normal, L ear normal, and no external deformities.   Nose:  no external deformity and no nasal discharge.   Mouth:  good dentition and pharynx pink and moist.    no jaw click  but tener at area or tmj  no lesions or swelling  Neck:  No deformities, masses, or tenderness noted. Cervical Nodes:  No lymphadenopathy noted   Impression & Recommendations:  Problem # 1:  TMJ PAIN (ICD-524.62)  right no evidence of ear disease and infection.     disc course and Expectant management    and follow up . No alarm features .    Complete Medication List: 1)  Labetalol Hcl 200 Mg Tabs (Labetalol hcl) .... 1/2 by mouth two times a day 2)  Naproxen 500 Mg Tabs (Naproxen) .Marland Kitchen.. 1 by mouth two times a day for  shoulder pain. 3)  Prenatal Vitamins 0.8 Mg Tabs (Prenatal multivit-min-fe-fa) .Marland Kitchen.. 1 tab once daily 4)  Premarin 0.3 Mg Tabs (Estrogens conjugated) .... Pt unsure of dosage 1 by mouth once daily 5)  Provera 5 Mg Tabs (Medroxyprogesterone acetate) .Marland Kitchen.. 1 by mouth once daily for 10 days  Patient Instructions: 1)  naproxyn two times a day for  up to 10-14 days  2)  ice and" jaw rest" if not getting better  in the next  1-2 week then see your dentist for evaluation.

## 2010-11-05 NOTE — Letter (Signed)
Summary: [facility] Discharge Note  Pt was discharged due to not tolerating PT.

## 2010-11-05 NOTE — Progress Notes (Signed)
Summary: LTR OF CLEARANCE  Phone Note From Other Clinic Call back at [phone]   Caller: [facility] @ [address]. KULSH[facility]HA Summary of Call: PT HAD AN ABNORMAL EKG.  NEEDS LETTER OF CLEARANCE BEFORE SHE CAN CONTINUE WITH FERTILITY TREATMENTS.  PT SAYS SHE HAD TREADMILL TESTS DONE HERE AND HAS ALREADY SIGNED RELEASE OF INFO FORM.  NEED TO HAVE LTR OF CLEARANCE FAXED TO [phone].  CALL IF QUESTIONS OR PROBLEMS. Initial call taken by: Levada Schilling,  Feb 23, 2007 3:59 PM  Follow-up for Phone Call        Clearance letter faxed Follow-up by: ERIN LEVAN,  Feb 25, 2007 12:27 PM

## 2010-11-05 NOTE — Assessment & Plan Note (Signed)
Summary: BREAST TENDERNESS/NJR   Vital Signs:  Patient profile:   55 year old female Menstrual status:  postmenopausal Weight:      212 pounds Temp:     98.3 degrees F oral Pulse rate:   72 / minute BP sitting:   110 / 70  (left arm) Cuff size:   regular  Vitals Entered By: Romualdo Bolk, CMA (AAMA) (August 17, 2009 9:56 AM) CC: Rt Breast achey- no lumps or masses. Pt states that her feet are still tingling. She has still not lost the weight from the pregnancy. , Hypertension Management   History of Present Illness: Alison Matthews comes in today for  co of problem with her right breast. She has had sensation of abnormal soresness  off an on for a while  .  She is due for a mammo .Last  Mamo gram   2 years ago.   and was ok. However needed mag views of right breast in same area of now current soreness  Stopped  Nursing  6 months ago.  No discharge mass otherwise or hx of breaset bx. She is thinking of having another child but went through [facility] in Mid 40"s  She has a toddler at home.  Mom with ostoporosis     no hx fracture.  She has never had a Dexa scan.  HT controlled   Hypertension History:      She complains of neurologic problems, but denies headache, chest pain, palpitations, dyspnea with exertion, orthopnea, PND, peripheral edema, visual symptoms, syncope, and side effects from treatment.  She notes no problems with any antihypertensive medication side effects.  Tingling of the feet.        Positive major cardiovascular risk factors include hypertension.  Negative major cardiovascular risk factors include female age less than 32 years old and non-tobacco-user status.     Preventive Screening-Counseling & Management  Alcohol-Tobacco     Alcohol drinks/day: 0     Smoking Status: never  Caffeine-Diet-Exercise     Caffeine use/day: 1 every 3 days     Does Patient Exercise: no  Current Medications (verified): 1)  Labetalol Hcl 200 Mg Tabs (Labetalol Hcl) .... 1/2  By Mouth Two Times A Day 2)  Motrin Ib 200 Mg Tabs (Ibuprofen)  Allergies (verified): No Known Drug Allergies  Past History:  Past medical, surgical, family and social histories (including risk factors) reviewed, and no changes noted (except as noted below).  Past Medical History: Reviewed history from 01/15/2009 and no changes required. Postmenopausal- Used Egg Donor and hormonal therapy for child G1P1  Hypertension- Pregnancy related Had First Child at age 21   10/2 /09    Past Surgical History: Reviewed history from 01/15/2009 and no changes required. 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 Henderson Cloud, [facility]  Ophthalmology:[facility]   Orthopedics: [facility]  Family History: Reviewed history from 01/15/2009 and no changes required. 3 younger sisters A&W, father age 36 ? Hbp, mother age 83 osteoporosis Father: HBP, arthristis Mother:  Osteoporosis Siblings: 3 younger sister- one had bx in breast others healthy.  Social History: Reviewed history from 01/15/2009 and no changes required. consultant with CPA background; married dennis Murin - Psychologist, forensic; non smoker; rare alcohol. Daughter Marylene Land  born in October 09  Sleep  deprived.  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  Orig from [place]  moved from [place]  4 years ago.   Review of Systems  The patient denies anorexia, fever, weight loss, weight gain, vision loss, decreased hearing, chest pain, syncope, dyspnea on exertion, prolonged cough, abdominal pain, unusual weight change, abnormal bleeding, enlarged lymph nodes, angioedema, and breast masses.    Physical Exam  General:  Well-developed,well-nourished,in no acute distress; alert,appropriate and cooperative throughout examination Head:  normocephalic and atraumatic.   Eyes:   vision grossly intact, pupils equal, and pupils round.   Neck:  No deformities, masses, or tenderness noted. Chest Wall:  no deformities and no mass.   Breasts:  skin/areolae normal and no masses.  midl tender ness around right lateral breast but no mass felt and no discharge  left breast normal  axilla normal  Lungs:  normal respiratory effort and no intercostal retractions.   Heart:  normal rate, regular rhythm, and no murmur.   Abdomen:  Bowel sounds positive,abdomen soft and non-tender without masses, organomegaly or  noted. Cervical Nodes:  No lymphadenopathy noted Axillary Nodes:  No palpable lymphadenopathy Psych:  Oriented X3, good eye contact, and not anxious appearing.     Impression & Recommendations:  Problem # 1:  BREAST PAIN, RIGHT (ICD-611.71) unclear cause .  due for mammo  rec diagnostic .  plans on another invitro which will have hormonal stimulation involved   . Orders: Radiology Referral (Radiology)  Problem # 2:  ASYMPTOMATIC POSTMENOPAUSAL STATUS (ICD-V49.81) fam hx of osteoporosis   considering a another invitro  pregnancy   at risk for osteoporosis   Problem # 3:  HYPERTENSION (ICD-401.9) Assessment: Unchanged  Her updated medication list for this problem includes:    Labetalol Hcl 200 Mg Tabs (Labetalol hcl) .Marland Kitchen... 1/2 by mouth two times a day  Problem # 4:  FAMILY HISTORY OSTEOPOROSIS (ICD-V17.81) Assessment: Comment Only  Complete Medication List: 1)  Labetalol Hcl 200 Mg Tabs (Labetalol hcl) .... 1/2 by mouth two times a day 2)  Motrin Ib 200 Mg Tabs (Ibuprofen)  Hypertension Assessment/Plan:      The patient's hypertensive risk group is category A: No risk factors and no target organ damage.  Today's blood pressure is 110/70.  Her blood pressure goal is < 140/90.  Patient Instructions: 1)  will refer for diagnostic mammogram  right breast and routine left. 2)  Dexa scan   3)  Vit amin d   equivalent of 1000 Iu per day

## 2010-11-05 NOTE — Letter (Signed)
Summary: [facility]  [facility]   Imported By: Maryln Gottron 02/14/2010 14:08:44  _____________________________________________________________________  External Attachment:    Type:   Image     Comment:   External Document

## 2010-11-05 NOTE — Assessment & Plan Note (Signed)
Summary: ROA/PAP/RCD   Vital Signs:  Patient profile:   55 year old female Menstrual status:  postmenopausal Weight:      199 pounds Pulse rate:   66 / minute BP sitting:   110 / 70  (right arm) Cuff size:   regular  Vitals Entered By: Romualdo Bolk, CMA (January 30, 2009 2:17 PM) CC: Follow-up visit on labs and needs pap done   History of Present Illness: Alison Matthews comes in for a gyne exam  and  lab review .  See last visit . Doing well from then to have foot exam this week. Feels pretty well. Just weaned form nursing.  BP ok .  No new signs .  Preventive Screening-Counseling & Management     Alcohol drinks/day: 0     Smoking Status: never     Caffeine use/day: 1 every 3 days     Does Patient Exercise: no  Current Medications (verified): 1)  Labetalol Hcl 100 Mg Tabs (Labetalol Hcl) .Marland Kitchen.. 1 By Mouth Two Times A Day 2)  Prenatal Vitamins 0.8 Mg Tabs (Prenatal Multivit-Min-Fe-Fa) 3)  Fenugreek 4)  Motrin Ib 200 Mg Tabs (Ibuprofen) 5)  Nevanc 6)  Omnipred  Allergies (verified): No Known Drug Allergies  Past History:  Past medical, surgical, family and social histories (including risk factors) reviewed, and no changes noted (except as noted below).  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 Hbp, mother age 40 osteoporosis       Father: HBP, arthristis       Mother:   Osteoporosis       Siblings: 3 younger sister- one had bx in breast others healthy.  Social History:    Reviewed history from 01/15/2009 and no changes required:       Research scientist (medical) with CPA background; married dennis Kawa - Psychologist, forensic; non smoker; rare alcohol.       Daughter Marylene Land  born in October 09        Sleep  deprived.        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                Orig from [place]  moved from [place]  4 years ago.   Review of Systems       no change in ROS from last visit . Physical Exam General Appearance: well developed, well nourished, no acute distress Eyes: conjunctiva and lids normal, PERRLA, EOMI, WNL Ears, Nose, Mouth, Throat: TM clear, nares clear, oral exam WNL Neck: supple, no lymphadenopathy, no thyromegaly, no JVD Respiratory: clear to auscultation and percussion, respiratory effort normal Cardiovascular: regular rate and rhythm, S1-S2, no murmur, rub or gallop, no bruits, peripheral pulses  normal and symmetric, no cyanosis, clubbing, edema or varicosities Chest: no scars, masses, tenderness; no asymmetry, skin changes, nipple discharge   Gastrointestinal: soft, non-tender; no hepatosplenomegaly, masses; active bowel sounds all quadrants, guaiac negative stool; no masses, tenderness, hemorrhoids tag non tender  Genitourinary: no vaginal discharge, lesions; no masses or tenderness  neg cmt no masses  Lymphatic: no cervical, axillary or inguinal adenopathy Musculoskeletal: gait normal, muscle tone and strength WNL, no joint swelling, effusions, discoloration, crepitus  Skin: clear, good turgor, color WNL, no rashes, lesions, or ulcerations Neurologic: normal mental status, normal reflexes, normal strength, and motion Psychiatric: alert; oriented to person, place and time Other Exam:     Impression & Recommendations:  Problem # 1:  PREVENTIVE HEALTH CARE (ICD-V70.0) lifestyle   intervention for lipids and weight.  etc.   Problem # 2:   ROUTINE GYNECOLOGICAL EXAM (ICD-V72.31)  Orders: Obtaining Screening PAP Smear (Z6109)  Complete Medication List: 1)  Labetalol Hcl 100 Mg Tabs (Labetalol hcl) .Marland Kitchen.. 1 by mouth two times a day 2)  Prenatal Vitamins 0.8 Mg Tabs (Prenatal multivit-min-fe-fa) 3)  Fenugreek  4)  Motrin Ib 200 Mg Tabs (Ibuprofen) 5)  Nevanc  6)  Omnipred   Patient Instructions: 1)  will be informed of  pap when available. 2)  rov in 6-12 months or as needed.

## 2010-11-05 NOTE — Progress Notes (Signed)
Summary: fax cxr and cpx  Phone Note Call from Patient Call back at Home Phone [phone]   Caller: Patient Summary of Call: Pt wants [place] to fax her last cpx and cxr done to [facility]. [phone]. Initial call taken by: Romualdo Bolk, CMA Duncan Dull),  August 23, 2010 10:22 AM  Follow-up for Phone Call        info fax. Follow-up by: Romualdo Bolk, CMA (AAMA),  August 23, 2010 10:22 AM

## 2010-11-05 NOTE — Letter (Signed)
Summary: [facility]  [facility]   Imported By: Maryln Gottron 12/13/2009 12:17:54  _____________________________________________________________________  External Attachment:    Type:   Image     Comment:   External Document

## 2010-11-05 NOTE — Progress Notes (Signed)
Summary: Change labetolol from 200mg  to 100mg   Phone Note Call from Patient Call back at Home Phone [phone]   Caller: Patient Summary of Call: Pt called back saying that she told someone on the phone that she wants to do 100mg  of labetolol instead of 200mg  cutting them in half. Due to the fact that it is hard for her to cut them in half.  Initial call taken by: Romualdo Bolk, CMA Duncan Dull),  August 06, 2010 3:18 PM  Follow-up for Phone Call        ok to change to 10omg 1 by mouth two times a day disp 3 months worth and refill x 1  or as per her pharmacy plan. Follow-up by: Madelin Headings MD,  August 06, 2010 6:36 PM  Additional Follow-up for Phone Call Additional follow up Details #1::        Rx sent to pharmacy. Additional Follow-up by: Romualdo Bolk, CMA (AAMA),  August 07, 2010 8:56 AM    New/Updated Medications: LABETALOL HCL 100 MG TABS (LABETALOL HCL) 1 by mouth two times a day Prescriptions: LABETALOL HCL 100 MG TABS (LABETALOL HCL) 1 by mouth two times a day  #180 x 1   Entered by:   Romualdo Bolk, CMA (AAMA)   Authorized by:   Madelin Headings MD   Signed by:   Romualdo Bolk, CMA (AAMA) on 08/07/2010   Method used:   Electronically to        [facility] [phone]* (retail)       [address]       [place], [place]  [address]       Ph: [phone]       Fax: [phone]   RxID:   [id number]

## 2010-11-05 NOTE — Progress Notes (Signed)
Summary: NEW RX   Phone Note Call from Patient Call back at Home Phone [phone]   Caller: Patient-LIVE CALL Summary of Call: ON LABETOLOL 100 MG two times a day. SHE IS OUT OF MEDS TODAY AND WOULD LIKE A NEW RX FOR 200MG  1QD, SO SHE CAN CUT  IN HALF.Marland Kitchen CALL NEW PHARMACY- [facility] [phone]. TRIAGE PLEASE RETURN CALL. PT KNOWS THAT [facility] IS OUT. Initial call taken by: Warnell Forester,  July 05, 2009 12:53 PM    New/Updated Medications: LABETALOL HCL 100 MG TABS (LABETALOL HCL) 1 by mouth two times a day Prescriptions: LABETALOL HCL 100 MG TABS (LABETALOL HCL) 1 by mouth two times a day  #30 x 0   Entered by:   Darra Lis RMA   Authorized by:   Madelin Headings MD   Signed by:   Darra Lis RMA on 07/05/2009   Method used:   Electronically to        [facility] [address] (retail)       [address]       [place], [place]  [address]       Ph: [phone] or [phone]       Fax: [phone]   RxID:   [id number]   Appended Document: NEW RX     Prescriptions: LABETALOL HCL 100 MG TABS (LABETALOL HCL) 1 by mouth two times a day  #30 x 0   Entered by:   Darra Lis RMA   Authorized by:   Madelin Headings MD   Signed by:   Darra Lis RMA on 07/05/2009   Method used:   Electronically to        [facility] [phone]* (retail)       [address]       [place], [place]  [address]       Ph: [phone]       Fax: [phone]   RxID:   [id number]     Appended Document: NEW RX  [facility] script canceled at the pharmacy. Ordered in error.

## 2010-11-05 NOTE — Consult Note (Signed)
Summary: [facility]   Imported By: Maryln Gottron 02/12/2010 09:39:56  _____________________________________________________________________  External Attachment:    Type:   Image     Comment:   External Document

## 2010-11-05 NOTE — Miscellaneous (Signed)
Summary: flu shot  Clinical Lists Changes  Observations: Added new observation of FLU VAX: Historical (07/01/2009 16:03)      Immunization History:  Influenza Immunization History:    Influenza:  historical (07/01/2009)

## 2010-11-05 NOTE — Progress Notes (Signed)
Summary: med change per pt.  Phone Note Call from Patient   Caller: Patient Call For: Alison Matthews Headings MD Summary of Call: Change meds per pt. Premarin 1.25 mg. one by mouth x 25 days Provera 10 mg. one by mouth daily x 10 days  Initial call taken by: Lynann Beaver CMA,  April 09, 2010 3:15 PM    New/Updated Medications: PREMARIN 1.25 MG TABS (ESTROGENS CONJUGATED)  PROVERA 10 MG TABS (MEDROXYPROGESTERONE ACETATE)

## 2010-11-05 NOTE — Progress Notes (Signed)
Summary: refill naproxen---see escript  Phone Note Call from Patient Call back at Home Phone [phone]   Caller: Patient-live call Reason for Call: Refill Medication Summary of Call: please call the pt personally when her refill is done. Needs today. Initial call taken by: Warnell Forester,  Mar 05, 2010 2:40 PM  Follow-up for Phone Call        Spoke to pt and she states that she is needing this for menstral cramping. She is expecting to start her cycle tomorrow and is leaving town tomorrow in the am. She gets severe cramps during her periods to the point of almost passing out. Follow-up by: Romualdo Bolk, CMA Duncan Dull),  Mar 05, 2010 4:48 PM  Additional Follow-up for Phone Call Additional follow up Details #1::        Rx sent to pharmacy. Additional Follow-up by: Romualdo Bolk, CMA (AAMA),  Mar 05, 2010 5:39 PM

## 2010-11-05 NOTE — Progress Notes (Signed)
Summary: CXR  Phone Note Call from Patient Call back at Home Phone [phone]   Caller: Patient Call For: Madelin Headings MD Summary of Call: pt needs referral for chest xray & results sent to fertility doctor Derenda Fennel, coordinator, fax [phone] Initial call taken by: Rudy Jew, RN,  July 19, 2010 2:36 PM  Follow-up for Phone Call        ok to order and do this. Follow-up by: Madelin Headings MD,  July 19, 2010 6:32 PM  Additional Follow-up for Phone Call Additional follow up Details #1::        Pt needs [place] to fax over CXR, pap and last cpx notes. Fax to [employer], coordinator, fax [phone]  Pt aware that order is in EMR and that she can go anytime. Additional Follow-up by: Romualdo Bolk, CMA (AAMA),  July 22, 2010 11:04 AM

## 2010-11-05 NOTE — Progress Notes (Signed)
Summary: Referral to dermotologist  Phone Note Call from Patient Call back at Home Phone [phone]   Reason for Call: Talk to Nurse Summary of Call: pt sts she was approved by dr. Darrick Penna to see a dermaologist, she is checking to see how she can get an appt or if this process has been started Initial call taken by: ERIN LEVAN,  March 11, 2007 9:52 AM  Follow-up for Phone Call        Will forward to Dr. Darrick Penna to obtain an order and diagnosis for pt to be referred for dermatology.  Follow-up by: AMY MARTIN RN,  March 11, 2007 9:59 AM  Additional Follow-up for Phone Call Additional follow up Details #1::        pt is checking status, pt sts she is about [place] town [place] 3 [place] so pt is very anxious about getting an appt ASAP. Additional Follow-up by: ERIN LEVAN,  March 12, 2007 10:34 AM   Additional Follow-up for Phone Call Additional follow up Details #2::    I left patient a message with names of drew/ dan jones hope gruber stuart tafeen  she can call and speed up process if she wishes

## 2010-11-05 NOTE — Miscellaneous (Signed)
Summary: Orders Update  Clinical Lists Changes  Observations: Added new observation of INSTRUCTIONS: APPT WITH DR NITKA ON 4.7.8 AT 2:15. [address] [phone]. [phone]. PT INFORMED (12/18/2006 10:26)     Patient Instructions: 1)  APPT WITH DR NITKA ON 4.7.8 AT 2:15. [address] [phone]. [phone]. PT INFORMED

## 2010-11-05 NOTE — Progress Notes (Signed)
Summary: Rx refill  Phone Note Call from Patient   Summary of Call: Patient is going out of town and realized she needed a refill for Labetalol 200mg  one tablet by mouth two times a day sent to [facility]. Patient can be reached at [phone]. Initial call taken by: Darra Lis RMA,  Feb 21, 2009 11:08 AM  Follow-up for Phone Call        Sent rx via electronically  Follow-up by: Romualdo Bolk, CMA,  Feb 21, 2009 11:12 AM      Prescriptions: LABETALOL HCL 100 MG TABS (LABETALOL HCL) 1 by mouth two times a day  #60 x 3   Entered by:   Romualdo Bolk, CMA   Authorized by:   Madelin Headings MD   Signed by:   Romualdo Bolk, CMA on 02/21/2009   Method used:   Electronically to        [facility] [address] (retail)       [address]       [place], [place]  [address]       Ph: [phone] or [phone]       Fax: [phone]   RxID:   [id number]

## 2010-11-05 NOTE — Miscellaneous (Signed)
Summary: Flu Shot/[facility]  Flu Shot/[facility]   Imported By: Maryln Gottron 07/17/2010 11:03:14  _____________________________________________________________________  External Attachment:    Type:   Image     Comment:   External Document

## 2010-11-27 ENCOUNTER — Other Ambulatory Visit (HOSPITAL_COMMUNITY): Payer: Self-pay | Admitting: Obstetrics and Gynecology

## 2010-11-27 DIAGNOSIS — O09529 Supervision of elderly multigravida, unspecified trimester: Secondary | ICD-10-CM

## 2010-11-28 ENCOUNTER — Ambulatory Visit (INDEPENDENT_AMBULATORY_CARE_PROVIDER_SITE_OTHER): Payer: BC Managed Care – PPO | Admitting: Internal Medicine

## 2010-11-28 ENCOUNTER — Encounter: Payer: Self-pay | Admitting: Internal Medicine

## 2010-11-28 VITALS — BP 122/80 | HR 90 | Temp 98.0°F | Ht 67.0 in | Wt 210.0 lb

## 2010-11-28 DIAGNOSIS — J029 Acute pharyngitis, unspecified: Secondary | ICD-10-CM

## 2010-11-28 DIAGNOSIS — J069 Acute upper respiratory infection, unspecified: Secondary | ICD-10-CM | POA: Insufficient documentation

## 2010-11-28 LAB — POCT RAPID STREP A (OFFICE): Rapid Strep A Screen: NEGATIVE

## 2010-11-28 MED ORDER — AMOXICILLIN 500 MG PO CAPS: 500.0000 mg | ORAL_CAPSULE | Freq: Three times a day (TID) | ORAL | Status: AC

## 2010-11-28 NOTE — Assessment & Plan Note (Signed)
Rapid strep obtained and reviewed neg. Continue throat gargle prn. Given prescription to hold for amoxicillin (preg class B) however do not begin abx unless sx fail to improve after total duration of 10-12 days. Followup if no improvement or worsening.

## 2010-11-28 NOTE — Progress Notes (Signed)
  Subjective:    Patient ID: Alison Matthews, female    DOB: 1956-05-07, 55 y.o.   MRN: [id number]  HPI Pt presents to clinic for evaluation of ST. Notes 8 day h/o initially ST and laryngitis. After several days developed NP cough and nasal congestion/drainage. Denies fever, chills, dyspnea or wheezing. Has had +sick exposure but no known strep exposures.  Attempting tylenol prn and gargling for ST. No alleviating or exacerbating factors. Recently noted to be pregnant.  Reviewed PMH, medications, and allergies.    Review of Systems  Constitutional: Negative for fever, chills and fatigue.  HENT: Positive for congestion and rhinorrhea. Negative for facial swelling and ear discharge.   Eyes: Negative for discharge and redness.  Respiratory: Positive for cough. Negative for shortness of breath and wheezing.        Objective:   Physical Exam  Constitutional: She appears well-developed and well-nourished. No distress.  HENT:  Head: Normocephalic and atraumatic.  Right Ear: Tympanic membrane, external ear and ear canal normal.  Left Ear: Tympanic membrane, external ear and ear canal normal.  Nose: Nose normal.  Mouth/Throat: Uvula is midline and mucous membranes are normal. Posterior oropharyngeal erythema present. No oropharyngeal exudate, posterior oropharyngeal edema or tonsillar abscesses.  Eyes: Conjunctivae are normal. Right eye exhibits no discharge. Left eye exhibits no discharge. No scleral icterus.  Cardiovascular: Normal rate, regular rhythm and normal heart sounds.  Exam reveals no gallop and no friction rub.   No murmur heard. Pulmonary/Chest: Effort normal and breath sounds normal. No respiratory distress. She has no wheezes. She has no rales.  Lymphadenopathy:    She has no cervical adenopathy.  Neurological: She is alert.  Skin: Skin is warm and dry. No rash noted. She is not diaphoretic. No erythema.          Assessment & Plan:

## 2010-11-29 NOTE — Consult Note (Signed)
  Alison Matthews, BRECHEEN              ACCOUNT NO.:  [id number]  MEDICAL RECORD NO.:  [id number]          PATIENT TYPE:  OUT  LOCATION:  MFM                           FACILITY:  [facility]  PHYSICIAN:  Particia Nearing, MD      DATE OF BIRTH:  1956-07-08  DATE OF CONSULTATION:  11/01/2010 DATE OF DISCHARGE:  07/09/2010                                CONSULTATION   PRECONCEPTION CONSULTATION  [name] is a 55 year old, gravida 1, para 1, Caucasian female who presents today for consultation in that she needs a clearance letter, so she may proceed with a frozen emboli as a transfer cycle.  She underwent IVF approximately 2 years ago which was successful.  In August 2009, she delivered a female infant weighing 6 pounds and 12 ounces by cesarean section due to breech presentation.  However, the month prior to delivery, she began to develop preeclampsia and so her delivery at 91- 1/2 weeks was due to mild preeclampsia.  Her postpartum course of this very uneventful, but she did require a anti-hypertensive which was labetalol and she has continued to take labetalol until now.  Therefore, she will be entering into this pregnancy as a chronic hypertensive and we discussed that in great detail.  She may remain on the medication and even require an higher dose especially in the third trimester.  She is at increased risk for having preeclampsia again.  Therefore, she needs to be on the same quite often in the office looking for the signs and symptoms of preeclampsia.  The fetus will need to be followed with serial ultrasounds for growth and also antenatal testing in the third trimester.  The goal would be to have a delivery at 39 weeks.  Even though the patient is 55 years old, slightly overweight female with chronic hypertension that is well controlled, I do believe that she is a candidate for pregnancy whether that consists of a frozen embryo or any other way.  She is highly motivated and also  extremely realistic and really understands her potential risk as a pregnant person, but is willing to accept those risk in order to have an second child.  Thank you very much.     Particia Nearing, MD     MD/MEDQ  D:  11/01/2010  T:  11/02/2010  Job:  [id number]  Electronically Signed by Particia Nearing  on 11/29/2010 01:17:33 PM

## 2010-12-05 ENCOUNTER — Encounter (HOSPITAL_COMMUNITY): Payer: Self-pay

## 2010-12-05 ENCOUNTER — Other Ambulatory Visit (HOSPITAL_COMMUNITY): Payer: Self-pay

## 2010-12-10 ENCOUNTER — Other Ambulatory Visit (HOSPITAL_COMMUNITY): Payer: Self-pay | Admitting: Obstetrics and Gynecology

## 2010-12-10 ENCOUNTER — Ambulatory Visit (HOSPITAL_COMMUNITY)
Admission: RE | Admit: 2010-12-10 | Discharge: 2010-12-10 | Disposition: A | Discharge status: Home / Self Care | Payer: BC Managed Care – PPO | Source: Ambulatory Visit | Attending: Obstetrics and Gynecology | Admitting: Obstetrics and Gynecology

## 2010-12-10 ENCOUNTER — Ambulatory Visit (HOSPITAL_COMMUNITY): Payer: BC Managed Care – PPO

## 2010-12-10 DIAGNOSIS — O09529 Supervision of elderly multigravida, unspecified trimester: Secondary | ICD-10-CM

## 2010-12-10 DIAGNOSIS — O3410 Maternal care for benign tumor of corpus uteri, unspecified trimester: Secondary | ICD-10-CM | POA: Insufficient documentation

## 2010-12-10 DIAGNOSIS — O10019 Pre-existing essential hypertension complicating pregnancy, unspecified trimester: Secondary | ICD-10-CM | POA: Insufficient documentation

## 2010-12-10 DIAGNOSIS — O09299 Supervision of pregnancy with other poor reproductive or obstetric history, unspecified trimester: Secondary | ICD-10-CM | POA: Insufficient documentation

## 2010-12-12 ENCOUNTER — Other Ambulatory Visit: Payer: Self-pay | Admitting: Obstetrics and Gynecology

## 2010-12-18 ENCOUNTER — Encounter (HOSPITAL_COMMUNITY): Payer: Self-pay

## 2010-12-18 ENCOUNTER — Other Ambulatory Visit (HOSPITAL_COMMUNITY): Payer: Self-pay

## 2011-01-14 ENCOUNTER — Encounter (HOSPITAL_COMMUNITY): Payer: Self-pay

## 2011-01-14 ENCOUNTER — Other Ambulatory Visit (HOSPITAL_COMMUNITY): Payer: Self-pay

## 2011-02-13 ENCOUNTER — Other Ambulatory Visit (HOSPITAL_COMMUNITY): Payer: Self-pay | Admitting: Obstetrics and Gynecology

## 2011-02-13 DIAGNOSIS — O09529 Supervision of elderly multigravida, unspecified trimester: Secondary | ICD-10-CM

## 2011-02-13 DIAGNOSIS — Z0489 Encounter for examination and observation for other specified reasons: Secondary | ICD-10-CM

## 2011-02-13 DIAGNOSIS — IMO0002 Reserved for concepts with insufficient information to code with codable children: Secondary | ICD-10-CM

## 2011-02-15 ENCOUNTER — Inpatient Hospital Stay (HOSPITAL_COMMUNITY)
Admission: EM | Admit: 2011-02-15 | Discharge: 2011-02-17 | DRG: 886 | Disposition: A | Discharge status: Home / Self Care | Payer: BC Managed Care – PPO | Source: Other Acute Inpatient Hospital | Attending: Internal Medicine | Admitting: Internal Medicine

## 2011-02-15 ENCOUNTER — Emergency Department (HOSPITAL_COMMUNITY)
Admission: EM | Admit: 2011-02-15 | Discharge: 2011-02-15 | Disposition: A | Discharge status: Other Acute Inpatient Hospital | Payer: BC Managed Care – PPO | Source: Home / Self Care | Attending: Emergency Medicine | Admitting: Emergency Medicine

## 2011-02-15 DIAGNOSIS — O99891 Other specified diseases and conditions complicating pregnancy: Secondary | ICD-10-CM | POA: Diagnosis present

## 2011-02-15 DIAGNOSIS — G5760 Lesion of plantar nerve, unspecified lower limb: Secondary | ICD-10-CM | POA: Diagnosis present

## 2011-02-15 DIAGNOSIS — I4891 Unspecified atrial fibrillation: Secondary | ICD-10-CM

## 2011-02-15 DIAGNOSIS — O169 Unspecified maternal hypertension, unspecified trimester: Secondary | ICD-10-CM | POA: Diagnosis present

## 2011-02-15 DIAGNOSIS — I2510 Atherosclerotic heart disease of native coronary artery without angina pectoris: Principal | ICD-10-CM | POA: Diagnosis present

## 2011-02-15 DIAGNOSIS — Z7982 Long term (current) use of aspirin: Secondary | ICD-10-CM

## 2011-02-15 DIAGNOSIS — Z01812 Encounter for preprocedural laboratory examination: Secondary | ICD-10-CM

## 2011-02-15 LAB — BASIC METABOLIC PANEL
BUN: 9 mg/dL (ref 6–23)
CO2: 23 mEq/L (ref 19–32)
Calcium: 9.7 mg/dL (ref 8.4–10.5)
Chloride: 107 mEq/L (ref 96–112)
Creatinine, Ser: 0.68 mg/dL (ref 0.4–1.2)
GFR calc Af Amer: >60 mL/min (ref >60)
GFR calc non Af Amer: >60 mL/min (ref >60)
Glucose, Bld: 109 mg/dL — ABNORMAL HIGH (ref 70–99)
Potassium: 4.7 mEq/L (ref 3.5–5.1)
Sodium: 138 mEq/L (ref 135–145)

## 2011-02-15 LAB — URINALYSIS, ROUTINE W REFLEX MICROSCOPIC
Bilirubin Urine: NEGATIVE
Glucose, UA: NEGATIVE mg/dL
Hgb urine dipstick: NEGATIVE
Ketones, ur: NEGATIVE mg/dL
Nitrite: NEGATIVE
Protein, ur: NEGATIVE mg/dL
Specific Gravity, Urine: 1.006 (ref 1.005–1.030)
Urobilinogen, UA: 0.2 mg/dL (ref 0.0–1.0)
pH: 6.5 (ref 5.0–8.0)

## 2011-02-15 LAB — CBC
HCT: 41.2 % (ref 36.0–46.0)
Hemoglobin: 13.6 g/dL (ref 12.0–15.0)
MCH: 28.7 pg (ref 26.0–34.0)
MCHC: 33 g/dL (ref 30.0–36.0)
MCV: 86.9 fL (ref 78.0–100.0)
Platelets: 256 10*3/uL (ref 150–400)
RBC: 4.74 MIL/uL (ref 3.87–5.11)
RDW: 13.9 % (ref 11.5–15.5)
WBC: 8.8 10*3/uL (ref 4.0–10.5)

## 2011-02-15 LAB — HCG, QUANTITATIVE, PREGNANCY: hCG, Beta Chain, Quant, S: 105832 m[IU]/mL — ABNORMAL HIGH (ref <5)

## 2011-02-15 LAB — TSH: TSH: 1.343 u[IU]/mL (ref 0.350–4.500)

## 2011-02-16 DIAGNOSIS — I517 Cardiomegaly: Secondary | ICD-10-CM

## 2011-02-16 LAB — BASIC METABOLIC PANEL
BUN: 11 mg/dL (ref 6–23)
CO2: 20 mEq/L (ref 19–32)
Calcium: 9.1 mg/dL (ref 8.4–10.5)
Chloride: 108 mEq/L (ref 96–112)
Creatinine, Ser: 0.64 mg/dL (ref 0.4–1.2)
GFR calc Af Amer: >60 mL/min (ref >60)
GFR calc non Af Amer: >60 mL/min (ref >60)
Glucose, Bld: 101 mg/dL — ABNORMAL HIGH (ref 70–99)
Potassium: 3.9 mEq/L (ref 3.5–5.1)
Sodium: 137 mEq/L (ref 135–145)

## 2011-02-18 NOTE — Op Note (Signed)
Alison Matthews, Alison Matthews              ACCOUNT NO.:  [id number]   MEDICAL RECORD NO.:  [id number]          PATIENT TYPE:  INP   LOCATION:  9372                          FACILITY:  [facility]   PHYSICIAN:  Kendra H. Tenny Craw, MD     DATE OF BIRTH:  12/04/1955   DATE OF PROCEDURE:  DATE OF DISCHARGE:                               OPERATIVE REPORT   PREOPERATIVE DIAGNOSES:  1. 38 and 2-week intrauterine pregnancy.  2. Advanced Maternal Age.  3. Mild preeclampsia.  4. Homero Fellers breech presentation.   POSTOPERATIVE DIAGNOSES:  1. 38 and 2-week intrauterine pregnancy.  2. Advanced Maternal age.  3. Mild preeclampsia.  4. Homero Fellers breech presentation.   Surgeon  Freddrick March. Tenny Craw, MD   Assistant  Vickie, Surgical Technologist   Anesthesia  Spinal   Operative Findings  Vigorous female infant in the frank breech presentation weighing 6  pounds 12 ounces with apgar scores of 9 at one minute and 9 at five  minutes.  Normal appearing ovaries, tubes, and uterus.   Specimens  Placenta for pathology secondary to pre-eclampsia   EBL  800cc   Complications  None   Condition  To recovery room in stable condition after delivery   PROCEDURE:  Alison Matthews is a 55 year old gravida 1, para 0 at 1 weeks  and 2 days gestational age who presents to maternity admissions for  evaluation of elevated blood pressures.  Over the last month of her  pregnancy, she has been noted to have an elevation in her blood  pressures.  On July 04, 2008, she was noted to have developed  proteinuria.  She was seen in the office today for evaluation of  worsening elevation of her blood pressures at home and was noted to have  blood pressures in the 140 to 160s over were 80s to 90s.  She was  therefore sent to maternity admissions for evaluation and preeclampsia  studies.  Laboratory values were within normal limits; however, 1+  proteinuria was noted and blood pressures were 140s to 160s over 80s to  110s.  Given the severe  range of blood pressures and proteinuria, the  decision was made to deliver for preeclampsia.  The infant's  presentation was known to be frank breech, and therefore the decision  was made to proceed with primary low-transverse cesarean section.  Of  note, this is an IVF pregnancy conceived with donor eggs.   Following the appropriate informed consent, the patient was brought to  the operating room where spinal anesthesia was administered and found to  be adequate.  The patient was placed in the dorsal supine position in a  leftward tilt, prepped and draped in a normal sterile fashion.  A  scalpel was then used to make a Pfannenstiel skin incision, which was  carried down through the underlying layers of soft tissue to the fascia.  The fascia was incised in the midline.  The fascial incision was then  extended laterally with Mayo scissors.  Superior aspect of the fascial  incision was grasped with Kocher clamps x2, tented up, and the  underlying rectus muscle was  dissected off sharply with the  electrocautery unit.  The same procedure was repeated on the inferior  aspect of the fascial incision.  The rectus muscles were then separated  in the midline.  The abdominal peritoneum was identified, tented up,  entered sharply with the Metzenbaum, and the incision was then extended  superiorly and inferiorly with good visualization of the bladder.  The  bladder blade was then inserted.  The vesicouterine peritoneum was  identified, tented up, entered sharply with the Metzenbaum, and the  incision was extended laterally, and the bladder flap was created  digitally.  A scalpel was then used to make a low-transverse incision on  the uterus.  The frank breech was then identified and delivered through  the uterine incision up to the level of the shoulder blades.  The arms  were swept across the anterior chest, the head was flexed, and the  infant was delivered, noted to cry vigorously on the  operative field.  The infant was bulb suctioned and then passed to the waiting  pediatricians.  The placenta then required manual extraction due to cord  avulsion.  After the placenta was delivered, a marginal cord insertion  was noted in the placenta.  The placenta was sent for pathologic  analysis.  The uterus was then exteriorized, cleared of all clot and  debris.  The uterine incision was repaired with #1 chromic in a running  locked fashion with a second imbricating layer.  The uterus, tubes, and  ovaries were inspected and found to be normal.  The uterus was then  returned to the abdominal cavity.  The abdominal cavity was cleared of  all clot and debris.  The uterine incision was reinspected and  hemostatic.  The abdominal peritoneum was then reapproximated with 2-0  Vicryl.  The fascia was closed with 0 looped PDS in a running locked  fashion, and the skin was closed with staples.  All sponge, lap, and  needle counts were correct x2.  The patient tolerated the procedure well  and was brought to the recovery room in stable condition following the  procedure.  She will be transferred to the AICU for 24 hours of  postpartum magnesium sulfate for seizure prophylaxis secondary to pre-  eclampsia.      Freddrick March. Tenny Craw, MD  Electronically Signed     [facility]/MEDQ  D:  07/07/2008  T:  07/08/2008  Job:  [id number]

## 2011-02-18 NOTE — Discharge Summary (Signed)
NAME:  Alison Matthews, Alison Matthews              ACCOUNT NO.:  [id number]   MEDICAL RECORD NO.:  [id number]         PATIENT TYPE:  WINP   LOCATION:                                FAC[facility]:  [facility]   PHYSICIAN:  Mark Anderson, M.D.    DATE OF BIRTH:  12/19/1955   DATE OF ADMISSION:  05/07/2008  DATE OF DISCHARGE:  05/10/2008                               DISCHARGE SUMMARY   FINAL DIAGNOSES:  Intrauterine pregnancy at 28-2/7th weeks, advanced  maternal age, mild pre-eclampsia, and frank breech presentation.   SURGEON:  Kendra H. Ross, MD   ASSISTANT:  Vicky, the surgical tack.   COMPLICATIONS:  None.   This a 55-year-old, G1, P0, presents to maternity admissions at about 38  and 2/7th weeks' gestation with elevated blood pressures.  Over the past  month, the patient has been noted to have some elevation of blood  pressure which she had been monitored for.  The patient on August 03, 2008, also started developing some proteinuria.  Pre-eclamptic labs and  studies have all been within normal limits, but at this point a decision  was made to proceed with a cesarean section secondary to this mild pre-  eclampsia and known breech presentation.  The patient's antepartum  course had also been complicated by advanced maternal age, positive  herpes II virus, and now this known breech presentation with elevated  blood pressures and proteinuria at term.  The patient was taken to the  operating room on July 07, 2008, by Dr. Kendra Ross where a primary  low transverse cesarean section was performed with the delivery of a 6  pound 12 ounce female infant with Apgars of 9 and 9.  The delivery went  without complication.  The patient was continued on mag sulfate for 24  hours after delivery.  The patient was also started on labetalol for her  blood pressures.  Otherwise, the patient's postoperative course was  without significant complications.  Her blood pressure started to lower.  By postoperative day #2, she  was able to be transferred to the Mother  and Baby Unit, and by postoperative day #3 she was felt ready for  discharge.  She was sent home on a regular diet, told to decrease her  activities, told to continue her labetalol 200 mg b.i.d., was given  Percocet 1-2 every 4 hours as needed for pain, was to of course call  with any temperatures, swelling, or any increased pain.  She was also to  follow up in our office in 2 days for a blood pressure check and was to  be monitoring this is home as well.   LABORATORY DATA ON DISCHARGE:  The patient had a hemoglobin of 11.0,  white blood cell count of 12.7, platelets of 240,000, and a PIH panel  that noticed an elevated LDH of about 315, but the rest was within  normal limits.  The patient also desired toxo titers and these came back  negative.      Michelle Bigelman, P.A.-C.      ______________________________  Mark Anderson, M.D.    MB/MEDQ    D:  08/17/2008  T:  08/18/2008  Job:  [id number] 

## 2011-02-18 NOTE — Discharge Summary (Signed)
NAMEMADAI, NUCCIO              ACCOUNT NO.:  [id number]   MEDICAL RECORD NO.:  [id number]         PATIENT TYPE:  WINP   LOCATION:                                [facility]   PHYSICIAN:  Malva Limes, M.D.    DATE OF BIRTH:  02/13/1956   DATE OF ADMISSION:  05/07/2008  DATE OF DISCHARGE:  05/10/2008                               DISCHARGE SUMMARY   FINAL DIAGNOSES:  Intrauterine pregnancy at 28-2/7th weeks, advanced  maternal age, mild pre-eclampsia, and frank breech presentation.   SURGEON:  Freddrick March. Tenny Craw, MD   ASSISTANTLynden Ang, the surgical tack.   COMPLICATIONS:  None.   This a 55 year old, G1, P0, presents to maternity admissions at about 24  and 2/7th weeks' gestation with elevated blood pressures.  Over the past  month, the patient has been noted to have some elevation of blood  pressure which she had been monitored for.  The patient on August 03, 2008, also started developing some proteinuria.  Pre-eclamptic labs and  studies have all been within normal limits, but at this point a decision  was made to proceed with a cesarean section secondary to this mild pre-  eclampsia and known breech presentation.  The patient's antepartum  course had also been complicated by advanced maternal age, positive  herpes II virus, and now this known breech presentation with elevated  blood pressures and proteinuria at term.  The patient was taken to the  operating room on July 07, 2008, by Dr. Waynard Reeds where a primary  low transverse cesarean section was performed with the delivery of a 6  pound 12 ounce female infant with Apgars of 9 and 9.  The delivery went  without complication.  The patient was continued on mag sulfate for 24  hours after delivery.  The patient was also started on labetalol for her  blood pressures.  Otherwise, the patient's postoperative course was  without significant complications.  Her blood pressure started to lower.  By postoperative day #2, she  was able to be transferred to the Mother  and Baby Unit, and by postoperative day #3 she was felt ready for  discharge.  She was sent home on a regular diet, told to decrease her  activities, told to continue her labetalol 200 mg b.i.d., was given  Percocet 1-2 every 4 hours as needed for pain, was to of course call  with any temperatures, swelling, or any increased pain.  She was also to  follow up in our office in 2 days for a blood pressure check and was to  be monitoring this is home as well.   LABORATORY DATA ON DISCHARGE:  The patient had a hemoglobin of 11.0,  white blood cell count of 12.7, platelets of 240,000, and a PIH panel  that noticed an elevated LDH of about 315, but the rest was within  normal limits.  The patient also desired toxo titers and these came back  negative.      Leilani Able, P.A.-C.      ______________________________  Malva Limes, M.D.    MB/MEDQ  D:  08/17/2008  T:  08/18/2008  Job:  [id number]

## 2011-02-21 ENCOUNTER — Telehealth: Payer: Self-pay | Admitting: Internal Medicine

## 2011-02-21 NOTE — Telephone Encounter (Signed)
Spoke with patient. She reports having bruising at her lovenox injection sites. She has had a few small nodules at injection sites, but they resolve over a few days. She reports a small amount of blood at one injection site on one occasion. She states she has spoken with her obstetrician who assured her bruising was normal with lovenox injections. She reports no other bleeding or bruising. Told pt bruising at injection sites was normal with lovenox, but to call if she experienced any other bleeding or bruising.   Pt also mentioned her BP last night was 100/60. She states she has instruction per Dr. Tenny Craw to split her labetolol dose throughout the day if her BP gets low. She will see Dr. Tenny Craw June 4th.

## 2011-02-21 NOTE — Telephone Encounter (Signed)
Pt going out of town today, lovanox was prescribed to her at the hospital by dr Tenny Craw and having a lot of bruising from inj sites as well as some bleeding, wants to know if this is normal or should see someone this morning? Pt also 4 months pregnant

## 2011-02-27 ENCOUNTER — Encounter: Payer: Self-pay | Admitting: Internal Medicine

## 2011-02-27 ENCOUNTER — Other Ambulatory Visit (HOSPITAL_COMMUNITY): Payer: Self-pay | Admitting: Obstetrics and Gynecology

## 2011-02-27 ENCOUNTER — Ambulatory Visit (HOSPITAL_COMMUNITY)
Admission: RE | Admit: 2011-02-27 | Discharge: 2011-02-27 | Disposition: A | Discharge status: Home / Self Care | Payer: BC Managed Care – PPO | Source: Ambulatory Visit | Attending: Obstetrics and Gynecology | Admitting: Obstetrics and Gynecology

## 2011-02-27 DIAGNOSIS — Z0489 Encounter for examination and observation for other specified reasons: Secondary | ICD-10-CM

## 2011-02-27 DIAGNOSIS — O09529 Supervision of elderly multigravida, unspecified trimester: Secondary | ICD-10-CM | POA: Insufficient documentation

## 2011-02-27 DIAGNOSIS — IMO0002 Reserved for concepts with insufficient information to code with codable children: Secondary | ICD-10-CM

## 2011-02-27 DIAGNOSIS — O3410 Maternal care for benign tumor of corpus uteri, unspecified trimester: Secondary | ICD-10-CM | POA: Insufficient documentation

## 2011-02-27 NOTE — Discharge Summary (Signed)
  NAMEKORAH, HUFSTEDLER              ACCOUNT NO.:  [id number]  MEDICAL RECORD NO.:  [id number]           PATIENT TYPE:  I  LOCATION:  2028                         FACILITY:  [facility]  PHYSICIAN:  Pricilla Riffle, MD, FACCDATE OF BIRTH:  [birth year]  DATE OF ADMISSION:  02/15/2011 DATE OF DISCHARGE:  02/17/2011                              DISCHARGE SUMMARY   IDENTIFICATION:  The patient is a 55 year old with atrial fibrillation. Plan for DC cardioversion.  The patient was sent sedated by Anesthesia with 50 mg lidocaine IV, 80 mg propofol IV.  With the pads in the AP position, attempted cardioversion was made was 150 joules synchronized biphasic energy.  The patient had one sinus beat and then went into a narrow complex tachycardia (SVT, regular).  She was then cardioverted again with 150 joules synchronized biphasic energy to sinus rhythm.  Procedure was without complications.  Note prior to procedure, fetal heart tones were good at 120 beats per minute.  After this procedure using the Doptone, the fetal heart tones were good at 150 beats per minute.  A 12-lead EKG pending.     Pricilla Riffle, MD, [facility]     PVR/MEDQ  D:  02/17/2011  T:  02/17/2011  Job:  [id number]  Electronically Signed by Dietrich Pates MD [facility] on 02/27/2011 12:56:22 PM

## 2011-02-27 NOTE — Discharge Summary (Signed)
Alison, Matthews              ACCOUNT NO.:  [id number]  MEDICAL RECORD NO.:  [id number]           PATIENT TYPE:  I  LOCATION:  2028                         FACILITY:  [facility]  PHYSICIAN:  Pricilla Riffle, MD, FACCDATE OF BIRTH:  1956-01-02  DATE OF ADMISSION:  02/15/2011 DATE OF DISCHARGE:  02/17/2011                              DISCHARGE SUMMARY   PRIMARY CARDIOLOGIST:  Pricilla Riffle, MD, [facility]  PRIMARY CARE PROVIDER:  Neta Mends. Fabian Sharp, MD  OB/GYN:  Carrington Clamp, MD  DISCHARGE DIAGNOSIS:  Atrial fibrillation with rapid ventricular response.  SECONDARY DIAGNOSES: 1. History of tachypalpitations. 2. Current pregnancy with history of preeclampsia with prior     pregnancy. 3. Hypertension. 4. Morton neuroma of the feet. 5. History of uterine polyps. 6. History of macular surgery and cataract surgery in 2010.  ALLERGIES:  No known drug allergies.  PROCEDURES: 1. A 2-D echocardiogram performed on Feb 16, 2011 showing an EF of 55-     60% with normal left atrial size. 2. Successful DC cardioversion with two 150-joule biphasic energies     delivered with successful conversion to sinus rhythm.  HISTORY OF PRESENT ILLNESS:  A 55 year old pregnant female with the above problem list who was in her usual state of health until the morning of admission when she had sudden onset of tachypalpitations with slight shortness of breath.  Presented to the [facility] ED where she was found to be in atrial fibrillation with rapid ventricular response and rates in the 170s.  She was admitted for further evaluation and management.  HOSPITAL COURSE:  After discussion with Electrophysiology, the patient was maintained on intravenous labetalol and also intravenous digoxin with little impact on heart rate.  The patient was anticoagulated with Lovenox.  As heart rates persisted in the 170s range, discussion was held with the patient's obstetrician and decision was made to pursue  DC cardioversion.  Prior to cardioversion, echocardiogram was performed showing normal LV function and normal left atrial size.  On Feb 16, 2011 with the assistance of anesthesia, the patient underwent DC cardioversion.  The first 150-joule biphasic energy resulted in SVT with ventricular rate of about 150 beats per minute.  A second 150-joule biphasic shock resulted in sinus rhythm.  Doppler fetal heart tones remained within normal limits throughout.  Intravenous digoxin was subsequently discontinued and the patient's labetalol was switched to 200 mg orally b.i.d.  She will be maintained on Lovenox 95 mg subcu b.i.d. for the next 3 weeks at which point she will begin aspirin 325 mg daily (advised to initiate 2 days prior to finishing Lovenox).  The patient has maintained sinus rhythm overnight and will be discharged home today in good condition.  DISCHARGE LABS:  Hemoglobin 13.6, hematocrit 41.2, WBC 8.8, platelets 256.  Sodium 137, potassium 3.9, chloride 108, CO2 20, BUN 11, creatinine 0.64, glucose 101, calcium 9.1.  TSH 1.343.  Quantitative HCG [id number].  Urinalysis was negative.  DISPOSITION:  The patient will be discharged home today in good condition.  FOLLOWUP PLANS AND APPOINTMENTS:  We have arranged for follow up with Dr. Dietrich Pates in our [place] office  on March 10, 2011 at 9:15 a.m. She will follow up with Dr. Fabian Sharp and Henderson Cloud as previously scheduled.  DISCHARGE MEDICATIONS: 1. Enoxaparin 100 mg/mL injection - 95 mg subcu b.i.d. X21 days. 2. Aspirin 325 mg daily to be initiated 2 days prior to finishing     Lovenox. 3. Labetalol 200 mg b.i.d. 4. Prenatal vitamin one tablet daily.  OUTSTANDING LABORATORY STUDIES:  None.  DURATION OF DISCHARGE ENCOUNTER:  Forty five minutes including physician time.     Alison Matthews, ANP   ______________________________ Pricilla Riffle, MD, [facility]    CB/MEDQ  D:  02/17/2011  T:  02/17/2011  Job:  [id number]  cc:   Neta Mends. Fabian Sharp, MD Carrington Clamp, M.D.  Electronically Signed by Alison Matthews ANP on 02/19/2011 01:14:24 PM Electronically Signed by Dietrich Pates MD [facility] on 02/27/2011 12:56:26 PM

## 2011-02-27 NOTE — H&P (Signed)
NAMEDAMICA, GRAVLIN              ACCOUNT NO.:  [id number]  MEDICAL RECORD NO.:  [id number]           PATIENT TYPE:  I  LOCATION:  2028                         FACILITY:  [facility]  PHYSICIAN:  Pricilla Riffle, MD, FACCDATE OF BIRTH:  01-Dec-1955  DATE OF ADMISSION:  02/15/2011 DATE OF DISCHARGE:                             HISTORY & PHYSICAL   IDENTIFICATION:  Ms. Sunde is a 55 year old who we are asked to see regarding atrial fibrillation with rapid response.  HISTORY OF PRESENT ILLNESS:  The patient has a history of report of a tachycardia since about the age of 40 and with probable SVT that was never been documented on EKG strip.  The episodes would last at most 3-5 minutes.  The longest spell she had was around 55 years old when it lasted 2 hours.  She found that if she leaned over a bench and pressed her stomach (question Valsalva-type maneuver), the episode stopped.  In her 57s, she had a few more episodes.  She saw a cardiologist in [place].  Echocardiogram was done.  She was told that looked okay. During her first pregnancy, she did not have a problem.  This morning, she woke up.  She had a half a cup of coffee.  About 10:30, she began to feel her heart race, noted slight shortness of breath.  No chest pain.  She denies dizziness.  Note, she has not passed out in the past.  ALLERGIES:  None.  MEDICATIONS ON ADMISSION: 1. Labetalol 100 b.i.d. 2. Aspirin 81. 3. Prenatal vitamins.  PAST MEDICAL HISTORY: 1. Probable tachycardia, question SVT. 2. Preeclampsia, blood pressure after delivery as high as 200     transiently. 3. G2, P1, first pregnancy again delivered by C-section with breech,     she had preeclampsia treated with magnesium drip. 4. Morton neuroma on the feet.  PAST SURGICAL HISTORY:  Uterine polyps, wisdom teeth removal, macular surgery and cataract surgery 2010.  SOCIAL HISTORY:  The patient lives in [place].  She is married.  She is a IT trainer.  She  has one child, 28-month-old.  Does not smoke, does not drink.  FAMILY HISTORY:  Significant for hypertension.  REVIEW OF SYSTEMS:  All systems reviewed, negative to the above problem except as noted above.  PHYSICAL EXAMINATION:  VITAL SIGNS:  Blood pressure is 118-150/80s-100. Pulse is 120-180.  Temperature is 98. GENERAL:  The patient in no acute distress. HEENT:  Normocephalic and atraumatic, EOMI, PERRL. NECK:  JVP is normal.  No bruits.  No thyromegaly. LUNGS:  Clear to auscultation without rales or wheezes. CARDIAC:  Irregularly irregular, S1 and S2.  No S3.  No murmurs. ABDOMEN:  Supple, slightly distended.  No hepatomegaly. EXTREMITIES:  Good distal pulses.  No lower extremity edema.  LABORATORY DATA:  Hemoglobin of 13.6, WBC of 8.8.  Other labs pending. Chest x-ray not done.  Telemetry atrial fibrillation as noted above.  A 12-lead EKG atrial fibrillation with ventricular rate of 178 beats per minute.  Nonspecific ST-T wave changes.  IMPRESSION:  The patient is a 55 year old with a history of tachycardia in the past, probable supraventricular tachycardia,  episodes very short lived, never captured, occurred infrequently, now with atrial fibrillation with rapid response.  Hemodynamics, her blood pressure is increased (actually had a history of preeclampsia in the past).  Exam is relatively unremarkable.  EKG unremarkable.  Labs are pending.  PLAN: 1. Admit to Cardiology service, IV labetalol, IV digoxin, and check     echocardiogram.  Check TSH. 2. Pregnancy.  This is her second.  We will discuss with Dr. Henderson Cloud.     She is at high risk already with a GYN history of preeclampsia.  We     will continue observation.     Pricilla Riffle, MD, [facility]     PVR/MEDQ  D:  02/15/2011  T:  02/16/2011  Job:  [id number]  cc:   Neta Mends. Fabian Sharp, MD Carrington Clamp, M.D.  Electronically Signed by Dietrich Pates MD [facility] on 02/27/2011 12:56:30 PM

## 2011-03-07 ENCOUNTER — Other Ambulatory Visit (HOSPITAL_COMMUNITY): Payer: BC Managed Care – PPO

## 2011-03-07 ENCOUNTER — Ambulatory Visit (HOSPITAL_COMMUNITY): Payer: BC Managed Care – PPO

## 2011-03-08 ENCOUNTER — Inpatient Hospital Stay (INDEPENDENT_AMBULATORY_CARE_PROVIDER_SITE_OTHER)
Admission: RE | Admit: 2011-03-08 | Discharge: 2011-03-08 | Disposition: A | Discharge status: Home / Self Care | Payer: BC Managed Care – PPO | Source: Ambulatory Visit | Attending: Family Medicine | Admitting: Family Medicine

## 2011-03-08 DIAGNOSIS — R05 Cough: Secondary | ICD-10-CM

## 2011-03-08 DIAGNOSIS — R059 Cough, unspecified: Secondary | ICD-10-CM

## 2011-03-08 DIAGNOSIS — J029 Acute pharyngitis, unspecified: Secondary | ICD-10-CM

## 2011-03-10 ENCOUNTER — Ambulatory Visit (INDEPENDENT_AMBULATORY_CARE_PROVIDER_SITE_OTHER): Payer: BC Managed Care – PPO | Admitting: Internal Medicine

## 2011-03-10 ENCOUNTER — Encounter: Payer: Self-pay | Admitting: Internal Medicine

## 2011-03-10 ENCOUNTER — Encounter: Payer: BC Managed Care – PPO | Admitting: Internal Medicine

## 2011-03-10 DIAGNOSIS — I4891 Unspecified atrial fibrillation: Secondary | ICD-10-CM | POA: Insufficient documentation

## 2011-03-10 DIAGNOSIS — I471 Supraventricular tachycardia, unspecified: Secondary | ICD-10-CM | POA: Insufficient documentation

## 2011-03-10 DIAGNOSIS — I498 Other specified cardiac arrhythmias: Secondary | ICD-10-CM

## 2011-03-10 DIAGNOSIS — I10 Essential (primary) hypertension: Secondary | ICD-10-CM

## 2011-03-10 NOTE — Assessment & Plan Note (Signed)
BP is very good.

## 2011-03-10 NOTE — Assessment & Plan Note (Signed)
She had transient SVT while in the hosp.  Occurred after first shock. History of it infreq in the past.  Broke rel easily.

## 2011-03-10 NOTE — Assessment & Plan Note (Signed)
Patient had less than 48 hr of atrial fibrillation a few wks ago.  Tolerated very well but rates were not controlled. She was cardioverted electrically.  Finished 3 wks of anticoagulation I will review with her OB.  I am not sure about the 40 mg bid dosing.  She senses the arrhythmia.  I Would probably recomm resuming ASA 325 mg.  Continue labatelol which is at a rel low dose.

## 2011-03-10 NOTE — Progress Notes (Signed)
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 The patient presented to the [facility] ER in mid may with an episode of tachycardia that would not break.  Found to be in afib. She was treated with b blocker and Dig and continued to have tachycardia.  On Sunday underwent D/C cardioversion (x 2.  First attempt with transient Sinus then SVT).  Sent home on higher dose of labetalol This is the patient's second pregnancy  First pregnancy complicated by preeclampsia.  Had HTN early on after pregnancy. Has been maintained on Labetalol 100 bid since last pregnancy.  She just finished her Lovenox dose of 1mg /kg bid yesterday.  Her obstetrician recomm that she take 40mg  SQ bid until 6 wks after pregnancy. No Known Allergies  Current Outpatient Prescriptions  Medication Sig Dispense Refill  . aspirin 81 MG tablet Take 81 mg by mouth daily.        Marland Kitchen azithromycin (ZITHROMAX) 250 MG tablet Take 2 tablets by mouth on day 1, followed by 1 tablet by mouth daily for 4 days.      Marland Kitchen estradiol (ESTRACE) 1 MG tablet Take 1 mg by mouth daily. Take 4 tabs in morning and 4 in the evening       . estrogens, conjugated, (PREMARIN) 1.25 MG tablet Take 1.25 mg by mouth daily.        Marland Kitchen labetalol (NORMODYNE) 100 MG tablet Take 100 mg by mouth 2 (two) times daily.        . medroxyPROGESTERone (PROVERA) 10 MG tablet Take 10 mg by mouth daily.        . naproxen (NAPROSYN) 500 MG tablet Take 500 mg by mouth 2 (two) times daily with a meal.        . Prenatal Vit-Fe Fumarate-FA (PRENAPLUS PO) Take by mouth daily.        Marland Kitchen PROGESTERONE IM Inject 10 mg into the muscle daily.          Past Medical History  Diagnosis Date  . Postmenopausal   . Tachycardia   . HTN (hypertension)   . Morton's neuroma   . Shoulder pain, left   . Breast pain     right  . Foot pain     bilateral  . Tingling     Past Surgical History  Procedure Date  . Cesarean section 10/10  .  Polypectomy 9/09    Uterine  . Wisdom tooth extraction 8/76  . 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 Cataract extraction 2/10    Family History  Problem Relation Age of Onset  . Hypertension    . Osteoporosis    . Arthritis      History   Social History  . Marital Status: Married    Spouse Name: N/A    Number of Children: N/A  . Years of Education: N/A   Occupational History  . Not on file.   Social History Main Topics  . Smoking status: Never Smoker   . Smokeless tobacco: Not on file  . Alcohol Use: No  . Drug Use: No  . Sexually Active: Not on file   Other Topics Concern  . Not on file   Social History Narrative  . No narrative on file    Review of Systems:  All systems reviewed.  They are negative to the above problem except as previously stated.  Vital Signs: BP 126/80  Pulse 75  Ht 5\' 7"  (1.702 m)  Wt 208 lb (94.348 kg)  BMI 32.58 kg/m2  Physical Exam Patient is in NAD  HEENT:  Normocephalic, atraumatic. EOMI, PERRLA.  Neck: JVP is normal. No thyromegaly. No bruits.  Lungs: clear to auscultation. No rales no wheezes.  Heart: Regular rate and rhythm. Normal S1, S2. No S3.   No significant murmurs. PMI not displaced.  Abdomen:  Supple, nontender. Normal bowel sounds. No masses. No hepatomegaly.  Extremities:   Good distal pulses throughout. No signif lower extremity edema.  Musculoskeletal :moving all extremities.  Neuro:   alert and oriented x3.  CN II-XII grossly intact.  EKG:  NSR>    Assessment and Plan:

## 2011-03-27 ENCOUNTER — Ambulatory Visit (HOSPITAL_COMMUNITY)
Admission: RE | Admit: 2011-03-27 | Discharge: 2011-03-27 | Disposition: A | Discharge status: Home / Self Care | Payer: BC Managed Care – PPO | Source: Ambulatory Visit | Attending: Obstetrics and Gynecology | Admitting: Obstetrics and Gynecology

## 2011-03-27 ENCOUNTER — Other Ambulatory Visit (HOSPITAL_COMMUNITY): Payer: Self-pay | Admitting: Obstetrics and Gynecology

## 2011-03-27 DIAGNOSIS — O09299 Supervision of pregnancy with other poor reproductive or obstetric history, unspecified trimester: Secondary | ICD-10-CM | POA: Insufficient documentation

## 2011-03-27 DIAGNOSIS — O3410 Maternal care for benign tumor of corpus uteri, unspecified trimester: Secondary | ICD-10-CM | POA: Insufficient documentation

## 2011-03-27 DIAGNOSIS — O09529 Supervision of elderly multigravida, unspecified trimester: Secondary | ICD-10-CM

## 2011-03-27 DIAGNOSIS — O10019 Pre-existing essential hypertension complicating pregnancy, unspecified trimester: Secondary | ICD-10-CM | POA: Insufficient documentation

## 2011-05-08 ENCOUNTER — Encounter (HOSPITAL_COMMUNITY): Payer: Self-pay

## 2011-05-08 ENCOUNTER — Ambulatory Visit (HOSPITAL_COMMUNITY)
Admission: RE | Admit: 2011-05-08 | Discharge: 2011-05-08 | Disposition: A | Discharge status: Home / Self Care | Payer: BC Managed Care – PPO | Source: Ambulatory Visit | Attending: Obstetrics and Gynecology | Admitting: Obstetrics and Gynecology

## 2011-05-08 VITALS — BP 120/67 | HR 70 | Wt 216.0 lb

## 2011-05-08 DIAGNOSIS — O34219 Maternal care for unspecified type scar from previous cesarean delivery: Secondary | ICD-10-CM | POA: Insufficient documentation

## 2011-05-08 DIAGNOSIS — O3410 Maternal care for benign tumor of corpus uteri, unspecified trimester: Secondary | ICD-10-CM | POA: Insufficient documentation

## 2011-05-08 DIAGNOSIS — O09299 Supervision of pregnancy with other poor reproductive or obstetric history, unspecified trimester: Secondary | ICD-10-CM | POA: Insufficient documentation

## 2011-05-08 DIAGNOSIS — O09529 Supervision of elderly multigravida, unspecified trimester: Secondary | ICD-10-CM | POA: Insufficient documentation

## 2011-05-08 DIAGNOSIS — O10019 Pre-existing essential hypertension complicating pregnancy, unspecified trimester: Secondary | ICD-10-CM | POA: Insufficient documentation

## 2011-05-08 NOTE — ED Notes (Signed)
Pt states she is scheduled for a C/S on 10.21.12

## 2011-05-08 NOTE — Progress Notes (Addendum)
Report in AS/EPIC; follow-up six weeks for growth.   Patient maintains prophylactic Lovenox; should be switched to prophylactic unfractionated heparin around 36 weeks, 10 thousand units q12 hours. Continue labetalol per cardiologist

## 2011-06-12 ENCOUNTER — Other Ambulatory Visit: Payer: Self-pay | Admitting: Obstetrics and Gynecology

## 2011-06-12 ENCOUNTER — Ambulatory Visit (HOSPITAL_COMMUNITY)
Admission: RE | Admit: 2011-06-12 | Discharge: 2011-06-12 | Disposition: A | Discharge status: Home / Self Care | Payer: BC Managed Care – PPO | Source: Ambulatory Visit | Attending: Obstetrics and Gynecology | Admitting: Obstetrics and Gynecology

## 2011-06-12 VITALS — BP 121/67 | HR 83 | Wt 221.0 lb

## 2011-06-12 DIAGNOSIS — O09299 Supervision of pregnancy with other poor reproductive or obstetric history, unspecified trimester: Secondary | ICD-10-CM | POA: Insufficient documentation

## 2011-06-12 DIAGNOSIS — O09529 Supervision of elderly multigravida, unspecified trimester: Secondary | ICD-10-CM

## 2011-06-12 DIAGNOSIS — O3410 Maternal care for benign tumor of corpus uteri, unspecified trimester: Secondary | ICD-10-CM | POA: Insufficient documentation

## 2011-06-12 DIAGNOSIS — O34219 Maternal care for unspecified type scar from previous cesarean delivery: Secondary | ICD-10-CM | POA: Insufficient documentation

## 2011-06-12 DIAGNOSIS — O10019 Pre-existing essential hypertension complicating pregnancy, unspecified trimester: Secondary | ICD-10-CM | POA: Insufficient documentation

## 2011-06-12 NOTE — Progress Notes (Signed)
Ultrasound in AS/OBGYN/EPIC.  Follow up U/S scheduled 

## 2011-06-12 NOTE — Progress Notes (Signed)
Encounter addended by: Marlana Latus, RN on: 06/12/2011 11:43 AM<BR>     Documentation filed: Episodes

## 2011-07-03 LAB — DIFFERENTIAL
Basophils Absolute: 0.1
Basophils Relative: 1
Eosinophils Absolute: 0.1
Eosinophils Relative: 1
Lymphocytes Relative: 20
Lymphs Abs: 1.8
Monocytes Absolute: 0.9
Monocytes Relative: 10
Neutro Abs: 5.9
Neutrophils Relative %: 68

## 2011-07-03 LAB — CBC
HCT: 37.6
Hemoglobin: 12.7
MCHC: 33.7
MCV: 88.8
Platelets: 264
RBC: 4.24
RDW: 13.6
WBC: 8.8

## 2011-07-03 LAB — POCT I-STAT, CHEM 8
BUN: 10
Calcium, Ion: 1.12
Chloride: 109
Creatinine, Ser: 0.7
Glucose, Bld: 101 — ABNORMAL HIGH
HCT: 38
Hemoglobin: 12.9
Potassium: 3.7
Sodium: 139
TCO2: 19

## 2011-07-03 LAB — APTT: aPTT: 27

## 2011-07-03 LAB — PROTIME-INR
INR: 0.9
Prothrombin Time: 12.3

## 2011-07-03 LAB — SAMPLE TO BLOOD BANK

## 2011-07-03 LAB — ABO/RH: ABO/RH(D): O POS

## 2011-07-04 ENCOUNTER — Other Ambulatory Visit (HOSPITAL_COMMUNITY): Payer: Self-pay | Admitting: Obstetrics and Gynecology

## 2011-07-04 ENCOUNTER — Ambulatory Visit (HOSPITAL_COMMUNITY)
Admission: RE | Admit: 2011-07-04 | Discharge: 2011-07-04 | Disposition: A | Discharge status: Home / Self Care | Payer: BC Managed Care – PPO | Source: Ambulatory Visit | Attending: Obstetrics and Gynecology | Admitting: Obstetrics and Gynecology

## 2011-07-04 DIAGNOSIS — O10019 Pre-existing essential hypertension complicating pregnancy, unspecified trimester: Secondary | ICD-10-CM | POA: Insufficient documentation

## 2011-07-04 DIAGNOSIS — O09529 Supervision of elderly multigravida, unspecified trimester: Secondary | ICD-10-CM

## 2011-07-04 DIAGNOSIS — O09299 Supervision of pregnancy with other poor reproductive or obstetric history, unspecified trimester: Secondary | ICD-10-CM | POA: Insufficient documentation

## 2011-07-04 DIAGNOSIS — O34219 Maternal care for unspecified type scar from previous cesarean delivery: Secondary | ICD-10-CM | POA: Insufficient documentation

## 2011-07-04 DIAGNOSIS — O3410 Maternal care for benign tumor of corpus uteri, unspecified trimester: Secondary | ICD-10-CM | POA: Insufficient documentation

## 2011-07-05 ENCOUNTER — Inpatient Hospital Stay (HOSPITAL_COMMUNITY)
Admission: EM | Admit: 2011-07-05 | Discharge: 2011-07-06 | DRG: 886 | Disposition: A | Discharge status: Home / Self Care | Payer: BC Managed Care – PPO | Attending: Cardiovascular Disease | Admitting: Cardiovascular Disease

## 2011-07-05 DIAGNOSIS — K219 Gastro-esophageal reflux disease without esophagitis: Secondary | ICD-10-CM | POA: Diagnosis present

## 2011-07-05 DIAGNOSIS — I4891 Unspecified atrial fibrillation: Secondary | ICD-10-CM

## 2011-07-05 DIAGNOSIS — O99891 Other specified diseases and conditions complicating pregnancy: Principal | ICD-10-CM | POA: Diagnosis present

## 2011-07-05 DIAGNOSIS — O169 Unspecified maternal hypertension, unspecified trimester: Secondary | ICD-10-CM | POA: Diagnosis present

## 2011-07-05 DIAGNOSIS — O09529 Supervision of elderly multigravida, unspecified trimester: Secondary | ICD-10-CM | POA: Diagnosis present

## 2011-07-05 LAB — COMPREHENSIVE METABOLIC PANEL
ALT: 13 U/L (ref 0–35)
AST: 19 U/L (ref 0–37)
Albumin: 2.5 g/dL — ABNORMAL LOW (ref 3.5–5.2)
Alkaline Phosphatase: 106 U/L (ref 39–117)
BUN: 10 mg/dL (ref 6–23)
CO2: 18 mEq/L — ABNORMAL LOW (ref 19–32)
Calcium: 9.5 mg/dL (ref 8.4–10.5)
Chloride: 105 mEq/L (ref 96–112)
Creatinine, Ser: 0.7 mg/dL (ref 0.50–1.10)
GFR calc Af Amer: >60 mL/min (ref >60)
GFR calc non Af Amer: >60 mL/min (ref >60)
Glucose, Bld: 119 mg/dL — ABNORMAL HIGH (ref 70–99)
Potassium: 4.1 mEq/L (ref 3.5–5.1)
Sodium: 134 mEq/L — ABNORMAL LOW (ref 135–145)
Total Bilirubin: 0.2 mg/dL — ABNORMAL LOW (ref 0.3–1.2)
Total Protein: 6.3 g/dL (ref 6.0–8.3)

## 2011-07-05 LAB — CBC
HCT: 40 % (ref 36.0–46.0)
Hemoglobin: 13.6 g/dL (ref 12.0–15.0)
MCH: 30.2 pg (ref 26.0–34.0)
MCHC: 34 g/dL (ref 30.0–36.0)
MCV: 88.7 fL (ref 78.0–100.0)
Platelets: 215 10*3/uL (ref 150–400)
RBC: 4.51 MIL/uL (ref 3.87–5.11)
RDW: 14 % (ref 11.5–15.5)
WBC: 9.7 10*3/uL (ref 4.0–10.5)

## 2011-07-05 LAB — POCT I-STAT TROPONIN I: Troponin i, poc: 0 ng/mL (ref 0.00–0.08)

## 2011-07-05 NOTE — ED Notes (Signed)
Pt to [facility] ed in atrial fib; pt put on fhr monitor=150's.  Dr Denton Lank in to see pt, md will consult with mfm, cardiology, and ob.

## 2011-07-06 LAB — CBC
HCT: 36 % (ref 36.0–46.0)
Hemoglobin: 12 g/dL (ref 12.0–15.0)
MCH: 29.5 pg (ref 26.0–34.0)
MCHC: 33.3 g/dL (ref 30.0–36.0)
MCV: 88.5 fL (ref 78.0–100.0)
Platelets: 218 10*3/uL (ref 150–400)
RBC: 4.07 MIL/uL (ref 3.87–5.11)
RDW: 14.2 % (ref 11.5–15.5)
WBC: 8.8 10*3/uL (ref 4.0–10.5)

## 2011-07-07 LAB — COMPREHENSIVE METABOLIC PANEL
ALT: 24
ALT: 24
ALT: 30
ALT: 31
AST: 34
AST: 35
AST: 36
AST: 39 — ABNORMAL HIGH
Albumin: 2 — ABNORMAL LOW
Albumin: 2.3 — ABNORMAL LOW
Albumin: 2.5 — ABNORMAL LOW
Albumin: 2.6 — ABNORMAL LOW
Alkaline Phosphatase: 75
Alkaline Phosphatase: 82
Alkaline Phosphatase: 96
Alkaline Phosphatase: 97
BUN: 11
BUN: 11
BUN: 11
BUN: 9
CO2: 20
CO2: 21
CO2: 23
CO2: 24
Calcium: 7.4 — ABNORMAL LOW
Calcium: 8 — ABNORMAL LOW
Calcium: 9.2
Calcium: 9.3
Chloride: 107
Chloride: 107
Chloride: 108
Chloride: 108
Creatinine, Ser: 0.8
Creatinine, Ser: 0.86
Creatinine, Ser: 0.95
Creatinine, Ser: 1.08
GFR calc Af Amer: >60
GFR calc Af Amer: >60
GFR calc Af Amer: >60
GFR calc Af Amer: >60
GFR calc non Af Amer: 53 — ABNORMAL LOW
GFR calc non Af Amer: >60
GFR calc non Af Amer: >60
GFR calc non Af Amer: >60
Glucose, Bld: 100 — ABNORMAL HIGH
Glucose, Bld: 122 — ABNORMAL HIGH
Glucose, Bld: 82
Glucose, Bld: 98
Potassium: 3.8
Potassium: 4
Potassium: 4.1
Potassium: 5.4 — ABNORMAL HIGH
Sodium: 136
Sodium: 136
Sodium: 137
Sodium: 137
Total Bilirubin: 0.1 — ABNORMAL LOW
Total Bilirubin: 0.1 — ABNORMAL LOW
Total Bilirubin: 0.2 — ABNORMAL LOW
Total Bilirubin: 0.3
Total Protein: 4.6 — ABNORMAL LOW
Total Protein: 5.3 — ABNORMAL LOW
Total Protein: 5.3 — ABNORMAL LOW
Total Protein: 5.8 — ABNORMAL LOW

## 2011-07-07 LAB — DIFFERENTIAL
Basophils Absolute: 0
Basophils Relative: 0
Eosinophils Absolute: 0.1
Eosinophils Relative: 1
Lymphocytes Relative: 19
Lymphs Abs: 2.3
Monocytes Absolute: 0.9
Monocytes Relative: 8
Neutro Abs: 8.9 — ABNORMAL HIGH
Neutrophils Relative %: 73

## 2011-07-07 LAB — CBC
HCT: 32.1 — ABNORMAL LOW
HCT: 34.3 — ABNORMAL LOW
HCT: 40.2
HCT: 40.5
Hemoglobin: 11 — ABNORMAL LOW
Hemoglobin: 11.4 — ABNORMAL LOW
Hemoglobin: 13.1
Hemoglobin: 13.3
MCHC: 32.8
MCHC: 32.9
MCHC: 33.3
MCHC: 34.2
MCV: 92.4
MCV: 93.5
MCV: 93.7
MCV: 94.2
Platelets: 192
Platelets: 200
Platelets: 214
Platelets: 240
RBC: 3.47 — ABNORMAL LOW
RBC: 3.64 — ABNORMAL LOW
RBC: 4.29
RBC: 4.33
RDW: 14.4
RDW: 14.6
RDW: 14.9
RDW: 15.2
WBC: 12.2 — ABNORMAL HIGH
WBC: 12.7 — ABNORMAL HIGH
WBC: 7.4
WBC: 7.5

## 2011-07-07 LAB — TOXOPLASMA ANTIBODIES- IGG AND  IGM
Toxoplasma Antibody- IgM: 0.29 IV
Toxoplasma IgG Ratio: <0.5 IU/mL

## 2011-07-07 LAB — URINALYSIS, ROUTINE W REFLEX MICROSCOPIC
Bilirubin Urine: NEGATIVE
Glucose, UA: NEGATIVE
Hgb urine dipstick: NEGATIVE
Ketones, ur: NEGATIVE
Leukocytes, UA: NEGATIVE
Nitrite: NEGATIVE
Protein, ur: 100 — AB
Specific Gravity, Urine: >1.03 — ABNORMAL HIGH
Urobilinogen, UA: 0.2
pH: 6

## 2011-07-07 LAB — URINE MICROSCOPIC-ADD ON

## 2011-07-07 LAB — URIC ACID
Uric Acid, Serum: 5.3
Uric Acid, Serum: 5.5
Uric Acid, Serum: 5.8

## 2011-07-07 LAB — LACTATE DEHYDROGENASE
LDH: 169
LDH: 241
LDH: 315 — ABNORMAL HIGH

## 2011-07-07 LAB — RPR
RPR Ser Ql: NONREACTIVE
RPR Ser Ql: NONREACTIVE

## 2011-07-07 NOTE — Discharge Summary (Addendum)
NAMECYLINDA, SANTOLI              ACCOUNT NO.:  [id number]  MEDICAL RECORD NO.:  [id number]  LOCATION:  2037                         FACILITY:  [facility]  PHYSICIAN:  Noralyn Pick. Eden Emms, MD, FACCDATE OF BIRTH:  [birth year]  DATE OF ADMISSION:  07/05/2011 DATE OF DISCHARGE:  07/06/2011                              DISCHARGE SUMMARY   DISCHARGE DIAGNOSES: 1. Atrial fibrillation with rapid ventricular response and     hypotension, requiring emergent cardioversion on July 05, 2011.     a.     History of atrial fibrillation/supraventricular tachycardia      in May 2012 requiring cardioversion at that time. 2. Current pregnancy, 36 weeks. 3. History of previous pregnancy being delivered by cesarean section     because of being breech with preeclampsia requiring magnesium drug. 4. History of Morton neuroma. 5. Gastroesophageal reflux disease. 6. Hypertension.  PAST SURGICAL HISTORY:  Macular surgery, wisdom teeth removable, uterine poly, and C-section.  HOSPITAL COURSE:  Ms. Magouirk is a 55 year old lady with a history of PAF and SVT, who has had these episodes primarily during the pregnancy. She is currently 55 weeks' pregnant.  About 10 o'clock in the morning of yesterday she went into rapid tachycardia in the ER.  Primarily PSVT with her episodes of paroxysmal AFib as well.  Blood pressure was relatively low at 80-85 systolic.  Dr. Eden Emms, had a long discussion with the patient and her husband.  Since she was relatively hypotensive and tachycardic, he was hesitant to use to any initial trial of drugs as there was not much room to give any more labetalol with her hypertension.  He discussed the case with OB/GYN and Anesthesia.  The patient was monitored with fetal monitoring with this is from the rapid response of [facility] nurse.  She was given a little of IV Lopressor prior to cardioversion to decrease _____ tones.  She had successful cardioversion with a single 120  joules biphasic shock.  The patient and her baby were observed overnight.  Lab work was fairly unremarkable.  On the day of discharge, the patient is doing better.  Fetal tracing per the Women's nurse OB nurse have been reassuring.  Dr. Eden Emms was seen and examined her today, so she is stable for discharge.  She will remain on her home dose of Lovenox.  DISCHARGE LABS:  WBC 8.8, hemoglobin 12, hematocrit 36, platelet count 218.  Sodium 134, potassium 4, chloride 105, CO2 of 80, glucose 119, BUN 10, and creatinine 0.70.  Troponin was negative.  PROCEDURES:  Direct current cardioversion on July 05, 2011.  DISCHARGE MEDICATIONS: 1. Labetalol 200 mg b.i.d. 2. Lovenox 40 mg/0.4 mL one injection subcutaneously daily. 3. Prenatal vitamin one tablet every evening. 4. Prilosec 20 mg daily.  DISPOSITION:  Ms. Bacorn will be discharged in stable condition to home.  She is to follow a low-sodium heart-healthy diet and to call for an appointment with her OB/GYN tomorrow.  She develops any recurrent symptoms in the interim she is to return to the ER.  DURATION OF DISCHARGE ENCOUNTER:  Greater than 30 minutes including physician and PA time.     Ronie Spies, P.A.C.  ______________________________ Noralyn Pick Eden Emms, MD, [facility]    DD/MEDQ  D:  07/06/2011  T:  07/06/2011  Job:  [id number]  cc:   Carrington Clamp, M.D. Pricilla Riffle, MD, [facility]  Electronically Signed by Charlton Haws MD [facility] on 07/07/2011 06:24:49 PM Electronically Signed by Ronie Spies  on 07/14/2011 09:40:23 AM

## 2011-07-07 NOTE — Consult Note (Signed)
Alison Matthews, Alison Matthews              ACCOUNT NO.:  [id number]  MEDICAL RECORD NO.:  [id number]  LOCATION:  [facility]                         FACILITY:  [facility]  PHYSICIAN:  Noralyn Pick. Eden Emms, MD, FACCDATE OF BIRTH:  1956-05-30  DATE OF CONSULTATION: DATE OF DISCHARGE:                                CONSULTATION   Ms. Redditt is a 55 year old patient of Dr. Tenny Craw.  She has a known history of PAF and SVT.  She has had episodes primarily during pregnancy.  She has a healthy 27-year-old at home and she is currently 78 weeks' pregnant.  She had an episode of PSVT and rapid atrial fibrillation in May.  This actually required cardioversion.  I spoke to Dr. Katrinka Blazing about this, as he participated in it in May.  The patient has been on Lovenox during her pregnancy.  It was recently switched to 100 mg once a day.  The plan was to stop and switch over to heparin in about a week and a half.  She did take her Lovenox this morning.  About 10 o'clock this morning, she went into rapid tachycardia in the emergency room.  It was primarily PSVT, but there were episodes of paroxysmal fibrillation as well.  Her blood pressure was relatively low at 80-85 systolic.  At home, the patient had been on labetalol 200 b.i.d. and digoxin.  I had a long discussion with the patient and her husband. Since she is relatively hypotensive and beating rapidly, I was hesitant to use an initial trial of drugs as there was not much room to give anymore labetalol with her hypertension.  We elected to arrange for cardioversion.  Her 10-point review of systems is remarkable for some GERD.  She did eat about 9:30 and had some clear liquids at about an hour before her cardioversion.  I discussed this with Dr. Katrinka Blazing and he was willing to proceed with close airway observation.  PAST MEDICAL HISTORY:  Remarkable for SVT, preeclampsia.  G2, para 1, first pregnancy delivered by C-section because of being breech with preeclampsia, requiring  magnesium drip.  History of Morton neuroma, history of GERD.  PAST SURGICAL HISTORY:  Uterine polyps, wisdom teeth removal, macular surgery.  The patient lives in [place].  She is married.  I met her husband today.  She is a IT trainer.  She has a 7-year-old daughter named Marylene Land.  She does not smoke or drink.  Pregnancy has otherwise been uncomplicated except for her cardioversion in May.  FAMILY HISTORY:  Remarkable for hypertension.  MEDICATIONS: 1. Labetalol 200 b.i.d. 2. Lovenox 100 a day. 3. Prilosec 20 a day. 4. Prenatal vitamins. She denies any allergies.  GENERAL:  Examination is remarkable for pregnant female in no distress. VITAL SIGNS:  Postcardioversion, her blood pressure is 113/70.  Initial telemetry showed SVT with PAF at a rate of 162.  Postcardioversion heart rate is 90. HEENT:  Unremarkable. NECK:  Carotids are normal without bruit.  No lymphadenopathy, thyromegaly, or JVP elevation. LUNGS:  Clear, good diaphragmatic motion.  No wheezing. ABDOMEN:  Protuberant, but consistent with pregnancy with fetal heart rate monitor on. EXTREMITIES:  Distal pulses are intact with trace edema. NEUROLOGIC:  Nonfocal. SKIN:  Warm  and dry.  No muscular weakness.  Lab work shows crit of 40.  Negative troponin.  Potassium 4.1. Creatinine 0.7.  IMPRESSION:  Paroxysmal atrial fibrillation and paroxysmal supraventricular tachycardia, pregnant woman, cardioversion carried out without difficulty, maintaining normal sinus rhythm.  Lovenox given today.  We will admit to CCU for 24-hour observation.  She will get her morning dose of Lovenox.  We will continue her labetalol.  I have spoken to Dr. Claiborne Billings and [facility] nurse who will arrange for fetal monitoring.  Baby is currently doing well.  We will support blood pressure with normal saline.  I did give her a little extra IV Lopressor prior to the cardioversion to decrease adrenergic tones.  We would  hopefully not have a rebound in her arrhythmia.  Cortisone cream can be applied to the chest and back as needed.     Noralyn Pick. Eden Emms, MD, [facility]     PCN/MEDQ  D:  07/05/2011  T:  07/05/2011  Job:  [id number]  Electronically Signed by Charlton Haws MD [facility] on 07/07/2011 06:24:44 PM

## 2011-07-07 NOTE — Consult Note (Signed)
  NAMEJOCIE, Matthews              ACCOUNT NO.:  [id number]  MEDICAL RECORD NO.:  [id number]  LOCATION:  [facility]                         FACILITY:  [facility]  PHYSICIAN:  Noralyn Pick. Eden Emms, MD, FACCDATE OF BIRTH:  [birth year]  DATE OF CONSULTATION:  07/05/2011 DATE OF DISCHARGE:                                Cardioversion   Alison Matthews is a 55 year old the patient who is 3 weeks' pregnant, fetal monitoring applied.  The patient had 100 mg Lovenox this morning. She did eat at 9:30 with a cardioversion taking place at about at 1 o'clock.  Dr. Katrinka Blazing from Anesthesia aware.  AP patches were placed to keep as far way from the baby is possible.  The patient was anesthetized by Dr. Katrinka Blazing, with 40 of lidocaine and 40 of propofol with single 120 joules biphasic shock was delivered.  The patient converted to normal sinus rhythm at a rate of 94 and additional 2.5  mg of IV Lopressor was given, 5 mg were given prior to the cardioversion, Lopressor was given to hopefully decrease adrenergic tone and rebound arrhythmia.  IMPRESSION:  Successful cardioversion in a 36-week pregnant female, currently hemodynamically stable, blood pressure increased to 113/70 post cardioversion.     Noralyn Pick. Eden Emms, MD, [facility]     PCN/MEDQ  D:  07/05/2011  T:  07/05/2011  Job:  [id number]  Electronically Signed by Charlton Haws MD [facility] on 07/07/2011 06:24:40 PM

## 2011-07-11 ENCOUNTER — Ambulatory Visit (HOSPITAL_COMMUNITY)
Admission: RE | Admit: 2011-07-11 | Discharge: 2011-07-11 | Disposition: A | Discharge status: Home / Self Care | Payer: BC Managed Care – PPO | Source: Ambulatory Visit | Attending: Obstetrics and Gynecology | Admitting: Obstetrics and Gynecology

## 2011-07-11 DIAGNOSIS — O34219 Maternal care for unspecified type scar from previous cesarean delivery: Secondary | ICD-10-CM | POA: Insufficient documentation

## 2011-07-11 DIAGNOSIS — O3410 Maternal care for benign tumor of corpus uteri, unspecified trimester: Secondary | ICD-10-CM | POA: Insufficient documentation

## 2011-07-11 DIAGNOSIS — O09529 Supervision of elderly multigravida, unspecified trimester: Secondary | ICD-10-CM | POA: Insufficient documentation

## 2011-07-11 DIAGNOSIS — O09299 Supervision of pregnancy with other poor reproductive or obstetric history, unspecified trimester: Secondary | ICD-10-CM | POA: Insufficient documentation

## 2011-07-11 NOTE — Progress Notes (Signed)
See ultrasound report in [facility] 

## 2011-07-11 NOTE — ED Notes (Signed)
Pt states C/S has been scheduled for 10.17.12 at [facility]

## 2011-07-13 ENCOUNTER — Other Ambulatory Visit: Payer: Self-pay

## 2011-07-13 ENCOUNTER — Inpatient Hospital Stay (HOSPITAL_COMMUNITY)
Admission: RE | Admit: 2011-07-13 | Discharge: 2011-07-17 | DRG: 370 | Disposition: A | Discharge status: Home / Self Care | Payer: BC Managed Care – PPO | Source: Ambulatory Visit | Attending: Obstetrics and Gynecology | Admitting: Obstetrics and Gynecology

## 2011-07-13 DIAGNOSIS — O09529 Supervision of elderly multigravida, unspecified trimester: Secondary | ICD-10-CM | POA: Diagnosis present

## 2011-07-13 DIAGNOSIS — O34219 Maternal care for unspecified type scar from previous cesarean delivery: Principal | ICD-10-CM | POA: Diagnosis present

## 2011-07-13 DIAGNOSIS — I2510 Atherosclerotic heart disease of native coronary artery without angina pectoris: Secondary | ICD-10-CM | POA: Diagnosis present

## 2011-07-13 DIAGNOSIS — Z9889 Other specified postprocedural states: Secondary | ICD-10-CM

## 2011-07-13 DIAGNOSIS — I4891 Unspecified atrial fibrillation: Secondary | ICD-10-CM | POA: Diagnosis present

## 2011-07-13 DIAGNOSIS — R000 Tachycardia, unspecified: Secondary | ICD-10-CM | POA: Diagnosis present

## 2011-07-13 LAB — CBC
HCT: 38.1 % (ref 36.0–46.0)
Hemoglobin: 12.5 g/dL (ref 12.0–15.0)
MCH: 29.5 pg (ref 26.0–34.0)
MCHC: 32.8 g/dL (ref 30.0–36.0)
MCV: 89.9 fL (ref 78.0–100.0)
Platelets: 223 10*3/uL (ref 150–400)
RBC: 4.24 MIL/uL (ref 3.87–5.11)
RDW: 14.2 % (ref 11.5–15.5)
WBC: 7.3 10*3/uL (ref 4.0–10.5)

## 2011-07-13 LAB — COMPREHENSIVE METABOLIC PANEL
ALT: 22 U/L (ref 0–35)
AST: 39 U/L — ABNORMAL HIGH (ref 0–37)
Albumin: 2.7 g/dL — ABNORMAL LOW (ref 3.5–5.2)
Alkaline Phosphatase: 108 U/L (ref 39–117)
BUN: 13 mg/dL (ref 6–23)
CO2: 21 mEq/L (ref 19–32)
Calcium: 9.9 mg/dL (ref 8.4–10.5)
Chloride: 104 mEq/L (ref 96–112)
Creatinine, Ser: 0.8 mg/dL (ref 0.50–1.10)
GFR calc Af Amer: >90 mL/min (ref >90)
GFR calc non Af Amer: 81 mL/min — ABNORMAL LOW (ref >90)
Glucose, Bld: 95 mg/dL (ref 70–99)
Potassium: 3.8 mEq/L (ref 3.5–5.1)
Sodium: 134 mEq/L — ABNORMAL LOW (ref 135–145)
Total Bilirubin: 0.2 mg/dL — ABNORMAL LOW (ref 0.3–1.2)
Total Protein: 6.4 g/dL (ref 6.0–8.3)

## 2011-07-13 LAB — DIFFERENTIAL
Basophils Absolute: 0 10*3/uL (ref 0.0–0.1)
Basophils Relative: 0 % (ref 0–1)
Eosinophils Absolute: 0.1 10*3/uL (ref 0.0–0.7)
Eosinophils Relative: 1 % (ref 0–5)
Lymphocytes Relative: 24 % (ref 12–46)
Lymphs Abs: 1.7 10*3/uL (ref 0.7–4.0)
Monocytes Absolute: 0.7 10*3/uL (ref 0.1–1.0)
Monocytes Relative: 10 % (ref 3–12)
Neutro Abs: 4.7 10*3/uL (ref 1.7–7.7)
Neutrophils Relative %: 65 % (ref 43–77)

## 2011-07-13 LAB — TYPE AND SCREEN
ABO/RH(D): O POS
Antibody Screen: NEGATIVE

## 2011-07-13 LAB — ABO/RH: ABO/RH(D): O POS

## 2011-07-13 LAB — HCG, SERUM, QUALITATIVE: Preg, Serum: POSITIVE — AB

## 2011-07-13 LAB — MRSA PCR SCREENING: MRSA by PCR: NEGATIVE

## 2011-07-13 LAB — RPR: RPR Ser Ql: NONREACTIVE

## 2011-07-13 LAB — APTT: aPTT: 29 seconds (ref 24–37)

## 2011-07-13 MED ORDER — INFLUENZA VIRUS VACC SPLIT PF IM SUSP: 0.5000 mL | INTRAMUSCULAR | Status: DC | PRN

## 2011-07-13 MED ORDER — LORATADINE 10 MG PO TABS
10.0000 mg | ORAL_TABLET | Freq: Every day | ORAL | Status: DC
  Filled 2011-07-13 (×2): qty 1

## 2011-07-13 MED ORDER — DEXTROSE 5 % IV SOLN
2.0000 g | INTRAVENOUS | Status: AC
  Administered 2011-07-14: 2 g via INTRAVENOUS
  Filled 2011-07-13: qty 2

## 2011-07-13 MED ORDER — DM-GUAIFENESIN ER 30-600 MG PO TB12
1.0000 | ORAL_TABLET | Freq: Two times a day (BID) | ORAL | Status: DC
  Filled 2011-07-13 (×2): qty 1

## 2011-07-13 MED ORDER — FAMOTIDINE IN NACL 20-0.9 MG/50ML-% IV SOLN
20.0000 mg | Freq: Two times a day (BID) | INTRAVENOUS | Status: DC
  Administered 2011-07-13 – 2011-07-14 (×2): 20 mg via INTRAVENOUS
  Filled 2011-07-13 (×3): qty 50

## 2011-07-13 MED ORDER — LABETALOL HCL 100 MG PO TABS
100.0000 mg | ORAL_TABLET | Freq: Two times a day (BID) | ORAL | Status: DC
  Administered 2011-07-13: 100 mg via ORAL
  Filled 2011-07-13 (×2): qty 1

## 2011-07-13 MED ORDER — LABETALOL HCL 100 MG PO TABS
100.0000 mg | ORAL_TABLET | Freq: Once | ORAL | Status: AC
  Administered 2011-07-13: 100 mg via ORAL
  Filled 2011-07-13: qty 1

## 2011-07-13 MED ORDER — DEXTROSE 5 % IV SOLN: 2.0000 g | INTRAVENOUS | Status: DC

## 2011-07-13 MED ORDER — ZOLPIDEM TARTRATE 10 MG PO TABS
10.0000 mg | ORAL_TABLET | Freq: Every evening | ORAL | Status: DC | PRN
  Administered 2011-07-13: 10 mg via ORAL
  Filled 2011-07-13: qty 1

## 2011-07-13 NOTE — H&P (Signed)
55 y.o.  with hypertension at 37 4/7 weeks   G2P1001 comes in for a repeat cesarean section at term.  Patient has good fetal movement and no bleeding. Patient has had a prenatal course complicated only by two episodes of atrial fibrillation causing tachycardia.  At 16 weeks she presented to [facility] with tachycardia and was cardioverted and subsequently controlled with labetalol.  On 9-29, the patient returned to the ER with tachycardia and Afib again.  She was cardioverted again and is now stable.  Pt was seen by MFM during her entire pregnancy for htn and advanced maternal age. She was started on Lovenox for Afib at 16 weeks and had good fetal growth (EFW on 09-28 was 5#3, 16 %ile).  On 10-3, MFM saw Ms. Alison Matthews and determined that at term there was a greater risk of fetal harm if the Afib returns versus the small risk of fetal lung imaturity at 37 1/2 weeks.  The patient has no signs or symptoms of preeclampsia.  MFM recommended O/N obs in AICU and telemetry before delivery.  Past Medical History  Diagnosis Date  . Postmenopausal   . Tachycardia   . HTN (hypertension)   . Morton's neuroma   . Shoulder pain, left   . Breast pain     right  . Foot pain     bilateral  . Tingling     Past Surgical History  Procedure Date  . Cesarean section 10/10  . Polypectomy 9/09    Uterine  . Wisdom tooth extraction 8/76  . 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 Cataract extraction 2/10    OB History    Grav Para Term Preterm Abortions TAB SAB Ect Mult Living   2 1 1  0 0 0 0 0 0 1     # Outc Date GA Lbr Len/2nd Wgt Sex Del Anes PTL Lv   1 TRM            2 CUR               History   Social History  . Marital Status: Married    Spouse Name: N/A    Number of Children: N/A  . Years of Education: N/A   Occupational History  . Not on file.   Social History Main Topics  . Smoking status: Never Smoker   . Smokeless tobacco: Not on file  . Alcohol Use: No  . Drug Use: No  .  Sexually Active: Not on file   Other Topics Concern  . Not on file   Social History Narrative  . No narrative on file   Review of patient's allergies indicates no known allergies.   Prenatal Course: See above.  Egg donor was younger than 74 and first trimester screen was negative.  Filed Vitals:   07/13/11 1314  BP: 125/68  Pulse: 76  Temp: 97.6 F (36.4 C)  Resp: 18     Lungs/Cor:  CTA, RRR Abdomen:  soft, gravid Ex:  no cords, erythema SVE:  NA FHTs:  NST R  A/P   37 4/7 weeks with two episodes of Afib that required cardioversion.  MFM recommends delivery now to avoid fetal harm from additional episodes of Afib and decreased placental perfusion.  For repeat cesarean sectionat term.  All risks, benefits and alternatives discussed with patient and she desires to proceed.  Pt admitted over night for telemetry monitoring.    Alison Matthews A

## 2011-07-13 NOTE — Progress Notes (Signed)
Pt sitting to eat

## 2011-07-13 NOTE — Progress Notes (Signed)
This weight was not entered by [facility] and is inaccurate-unable to delete

## 2011-07-13 NOTE — Anesthesia Preprocedure Evaluation (Addendum)
Anesthesia Evaluation  Name, MR# and DOB Patient awake  General Assessment Comment  Reviewed: Allergy & Precautions, H&P , NPO status , Patient's Chart, lab work & pertinent test results, reviewed documented beta blocker date and time   History of Anesthesia Complications Negative for: history of anesthetic complications  Airway Mallampati: I TM Distance: >3 FB Neck ROM: full    Dental  (+) Teeth Intact   Pulmonary Recent URI   clear to auscultation        Cardiovascular hypertension (preeclampsia in previous pregnancy, CHTN ever since), On Home Beta Blockers + dysrhythmias (diagnosed with A. Fib at 17 weeks - cardioverted at [facility].  NSR until last week, then cardioverted for A. Fib again.) Atrial Fibrillation regular Normal    Neuro/Psych Negative Neurological ROS  Negative Psych ROS   GI/Hepatic negative GI ROS Neg liver ROS    Endo/Other  Negative Endocrine ROS  Renal/GU negative Renal ROS     Musculoskeletal   Abdominal   Peds  Hematology negative hematology ROS (+) Has been on lovenox for A. Fib.  Last dose 10/6 at 10 pm   Anesthesia Other Findings   Reproductive/Obstetrics (+) Pregnancy (h/o prior c/s x1, AMA)                           Anesthesia Physical Anesthesia Plan  ASA: III  Anesthesia Plan: Spinal   Post-op Pain Management:    Induction:   Airway Management Planned:   Additional Equipment:   Intra-op Plan:   Post-operative Plan:   Informed Consent: I have reviewed the patients History and Physical, chart, labs and discussed the procedure including the risks, benefits and alternatives for the proposed anesthesia with the patient or authorized representative who has indicated his/her understanding and acceptance.   Dental Advisory Given  Plan Discussed with: CRNA and Surgeon  Anesthesia Plan Comments:         Anesthesia Quick Evaluation

## 2011-07-14 ENCOUNTER — Inpatient Hospital Stay (HOSPITAL_COMMUNITY): Payer: BC Managed Care – PPO | Admitting: Anesthesiology

## 2011-07-14 ENCOUNTER — Encounter (HOSPITAL_COMMUNITY): Payer: Self-pay | Admitting: *Deleted

## 2011-07-14 ENCOUNTER — Encounter (HOSPITAL_COMMUNITY)
Admission: RE | Disposition: A | Discharge status: Home / Self Care | Payer: Self-pay | Source: Ambulatory Visit | Attending: Obstetrics and Gynecology

## 2011-07-14 ENCOUNTER — Encounter (HOSPITAL_COMMUNITY): Payer: Self-pay | Admitting: Anesthesiology

## 2011-07-14 LAB — APTT: aPTT: 30 seconds (ref 24–37)

## 2011-07-14 SURGERY — Surgical Case
Anesthesia: Spinal | Site: Abdomen | Wound class: Clean Contaminated

## 2011-07-14 MED ORDER — LACTATED RINGERS IV SOLN
INTRAVENOUS | Status: DC
  Administered 2011-07-14: 16:00:00 via INTRAVENOUS

## 2011-07-14 MED ORDER — ZOLPIDEM TARTRATE 5 MG PO TABS: 5.0000 mg | ORAL_TABLET | Freq: Every evening | ORAL | Status: DC | PRN

## 2011-07-14 MED ORDER — NALBUPHINE HCL 10 MG/ML IJ SOLN
5.0000 mg | INTRAMUSCULAR | Status: DC | PRN
  Filled 2011-07-14: qty 1

## 2011-07-14 MED ORDER — METOCLOPRAMIDE HCL 5 MG/ML IJ SOLN: 10.0000 mg | Freq: Three times a day (TID) | INTRAMUSCULAR | Status: DC | PRN

## 2011-07-14 MED ORDER — LACTATED RINGERS IV SOLN
INTRAVENOUS | Status: DC | PRN
  Administered 2011-07-14 (×3): via INTRAVENOUS

## 2011-07-14 MED ORDER — DIPHENHYDRAMINE HCL 50 MG/ML IJ SOLN: 12.5000 mg | INTRAMUSCULAR | Status: DC | PRN

## 2011-07-14 MED ORDER — KETOROLAC TROMETHAMINE 30 MG/ML IJ SOLN
30.0000 mg | Freq: Four times a day (QID) | INTRAMUSCULAR | Status: AC | PRN
  Administered 2011-07-14: 30 mg via INTRAMUSCULAR

## 2011-07-14 MED ORDER — SIMETHICONE 80 MG PO CHEW
80.0000 mg | CHEWABLE_TABLET | Freq: Three times a day (TID) | ORAL | Status: DC
  Administered 2011-07-14 – 2011-07-16 (×8): 80 mg via ORAL

## 2011-07-14 MED ORDER — INFLUENZA VIRUS VACC SPLIT PF IM SUSP
0.5000 mL | Freq: Once | INTRAMUSCULAR | Status: AC
  Administered 2011-07-16: 0.5 mL via INTRAMUSCULAR
  Filled 2011-07-14: qty 0.5

## 2011-07-14 MED ORDER — FENTANYL CITRATE 0.05 MG/ML IJ SOLN
INTRAMUSCULAR | Status: DC | PRN
  Administered 2011-07-14: 80 ug via INTRAVENOUS

## 2011-07-14 MED ORDER — ONDANSETRON HCL 4 MG/2ML IJ SOLN: 4.0000 mg | Freq: Three times a day (TID) | INTRAMUSCULAR | Status: DC | PRN

## 2011-07-14 MED ORDER — SENNOSIDES-DOCUSATE SODIUM 8.6-50 MG PO TABS
2.0000 | ORAL_TABLET | Freq: Every day | ORAL | Status: DC
  Administered 2011-07-14 – 2011-07-16 (×3): 2 via ORAL

## 2011-07-14 MED ORDER — BISACODYL 10 MG RE SUPP: 10.0000 mg | Freq: Every day | RECTAL | Status: DC | PRN

## 2011-07-14 MED ORDER — SCOPOLAMINE 1 MG/3DAYS TD PT72
MEDICATED_PATCH | TRANSDERMAL | Status: AC
  Filled 2011-07-14: qty 1

## 2011-07-14 MED ORDER — MEASLES, MUMPS & RUBELLA VAC ~~LOC~~ INJ
0.5000 mL | INJECTION | Freq: Once | SUBCUTANEOUS | Status: DC
  Filled 2011-07-14: qty 0.5

## 2011-07-14 MED ORDER — FENTANYL CITRATE 0.05 MG/ML IJ SOLN
INTRAMUSCULAR | Status: DC | PRN
  Administered 2011-07-14: 20 ug via INTRATHECAL

## 2011-07-14 MED ORDER — ONDANSETRON HCL 4 MG/2ML IJ SOLN
INTRAMUSCULAR | Status: DC | PRN
  Administered 2011-07-14: 4 mg via INTRAVENOUS

## 2011-07-14 MED ORDER — PHENYLEPHRINE 40 MCG/ML (10ML) SYRINGE FOR IV PUSH (FOR BLOOD PRESSURE SUPPORT)
PREFILLED_SYRINGE | INTRAVENOUS | Status: AC
  Filled 2011-07-14: qty 5

## 2011-07-14 MED ORDER — LANOLIN HYDROUS EX OINT: 1.0000 "application " | TOPICAL_OINTMENT | CUTANEOUS | Status: DC | PRN

## 2011-07-14 MED ORDER — OXYTOCIN 20 UNITS IN LACTATED RINGERS INFUSION - SIMPLE
INTRAVENOUS | Status: AC
  Administered 2011-07-14: 20 [IU]
  Filled 2011-07-14: qty 1000

## 2011-07-14 MED ORDER — PHENYLEPHRINE HCL 10 MG/ML IJ SOLN
INTRAMUSCULAR | Status: DC | PRN
  Administered 2011-07-14 (×5): 80 ug via INTRAVENOUS

## 2011-07-14 MED ORDER — DIPHENHYDRAMINE HCL 25 MG PO CAPS: 25.0000 mg | ORAL_CAPSULE | ORAL | Status: DC | PRN

## 2011-07-14 MED ORDER — ONDANSETRON HCL 4 MG/2ML IJ SOLN
INTRAMUSCULAR | Status: AC
  Filled 2011-07-14: qty 2

## 2011-07-14 MED ORDER — WITCH HAZEL-GLYCERIN EX PADS: 1.0000 "application " | MEDICATED_PAD | CUTANEOUS | Status: DC | PRN

## 2011-07-14 MED ORDER — PRENATAL PLUS 27-1 MG PO TABS
1.0000 | ORAL_TABLET | Freq: Every day | ORAL | Status: DC
  Administered 2011-07-14 – 2011-07-17 (×4): 1 via ORAL
  Filled 2011-07-14 (×4): qty 1

## 2011-07-14 MED ORDER — IBUPROFEN 600 MG PO TABS: 600.0000 mg | ORAL_TABLET | Freq: Four times a day (QID) | ORAL | Status: DC | PRN

## 2011-07-14 MED ORDER — DIBUCAINE 1 % RE OINT: 1.0000 "application " | TOPICAL_OINTMENT | RECTAL | Status: DC | PRN

## 2011-07-14 MED ORDER — SIMETHICONE 80 MG PO CHEW: 80.0000 mg | CHEWABLE_TABLET | ORAL | Status: DC | PRN

## 2011-07-14 MED ORDER — MORPHINE SULFATE (PF) 0.5 MG/ML IJ SOLN
INTRAMUSCULAR | Status: DC | PRN
  Administered 2011-07-14: 4.85 ug via INTRAVENOUS

## 2011-07-14 MED ORDER — MORPHINE SULFATE 0.5 MG/ML IJ SOLN
INTRAMUSCULAR | Status: AC
  Filled 2011-07-14: qty 10

## 2011-07-14 MED ORDER — TETANUS-DIPHTH-ACELL PERTUSSIS 5-2.5-18.5 LF-MCG/0.5 IM SUSP
0.5000 mL | Freq: Once | INTRAMUSCULAR | Status: AC
  Administered 2011-07-15: 0.5 mL via INTRAMUSCULAR
  Filled 2011-07-14: qty 0.5

## 2011-07-14 MED ORDER — SODIUM CHLORIDE 0.9 % IJ SOLN: 3.0000 mL | INTRAMUSCULAR | Status: DC | PRN

## 2011-07-14 MED ORDER — KETOROLAC TROMETHAMINE 30 MG/ML IJ SOLN
INTRAMUSCULAR | Status: AC
  Administered 2011-07-14: 30 mg via INTRAMUSCULAR
  Filled 2011-07-14: qty 1

## 2011-07-14 MED ORDER — FLEET ENEMA 7-19 GM/118ML RE ENEM: 1.0000 | ENEMA | RECTAL | Status: DC | PRN

## 2011-07-14 MED ORDER — BUPIVACAINE IN DEXTROSE 0.75-8.25 % IT SOLN
INTRATHECAL | Status: AC
  Filled 2011-07-14: qty 2

## 2011-07-14 MED ORDER — MENTHOL 3 MG MT LOZG: 1.0000 | LOZENGE | OROMUCOSAL | Status: DC | PRN

## 2011-07-14 MED ORDER — BUPIVACAINE IN DEXTROSE 0.75-8.25 % IT SOLN
INTRATHECAL | Status: DC | PRN
  Administered 2011-07-14: 12 mg via INTRATHECAL

## 2011-07-14 MED ORDER — OXYTOCIN 20 UNITS IN LACTATED RINGERS INFUSION - SIMPLE: 125.0000 mL/h | INTRAVENOUS | Status: AC

## 2011-07-14 MED ORDER — MORPHINE SULFATE (PF) 0.5 MG/ML IJ SOLN
INTRAMUSCULAR | Status: DC | PRN
  Administered 2011-07-14: .15 mg via INTRATHECAL

## 2011-07-14 MED ORDER — ONDANSETRON HCL 4 MG/2ML IJ SOLN: 4.0000 mg | INTRAMUSCULAR | Status: DC | PRN

## 2011-07-14 MED ORDER — DIPHENHYDRAMINE HCL 25 MG PO CAPS: 25.0000 mg | ORAL_CAPSULE | Freq: Four times a day (QID) | ORAL | Status: DC | PRN

## 2011-07-14 MED ORDER — FENTANYL CITRATE 0.05 MG/ML IJ SOLN
INTRAMUSCULAR | Status: AC
  Filled 2011-07-14: qty 2

## 2011-07-14 MED ORDER — SODIUM CHLORIDE 0.9 % IV SOLN
1.0000 ug/kg/h | INTRAVENOUS | Status: DC | PRN
  Filled 2011-07-14: qty 2.5

## 2011-07-14 MED ORDER — SCOPOLAMINE 1 MG/3DAYS TD PT72
1.0000 | MEDICATED_PATCH | Freq: Once | TRANSDERMAL | Status: AC
  Administered 2011-07-14: 1.5 mg via TRANSDERMAL

## 2011-07-14 MED ORDER — IBUPROFEN 600 MG PO TABS
600.0000 mg | ORAL_TABLET | Freq: Four times a day (QID) | ORAL | Status: DC
  Administered 2011-07-14 – 2011-07-17 (×11): 600 mg via ORAL
  Filled 2011-07-14 (×11): qty 1

## 2011-07-14 MED ORDER — KETOROLAC TROMETHAMINE 60 MG/2ML IM SOLN
60.0000 mg | Freq: Once | INTRAMUSCULAR | Status: AC | PRN
  Filled 2011-07-14: qty 2

## 2011-07-14 MED ORDER — FERROUS SULFATE 325 (65 FE) MG PO TABS
325.0000 mg | ORAL_TABLET | Freq: Two times a day (BID) | ORAL | Status: DC
  Administered 2011-07-14 – 2011-07-17 (×6): 325 mg via ORAL
  Filled 2011-07-14 (×6): qty 1

## 2011-07-14 MED ORDER — ONDANSETRON HCL 4 MG PO TABS: 4.0000 mg | ORAL_TABLET | ORAL | Status: DC | PRN

## 2011-07-14 MED ORDER — MEPERIDINE HCL 25 MG/ML IJ SOLN: 6.2500 mg | INTRAMUSCULAR | Status: DC | PRN

## 2011-07-14 MED ORDER — OXYTOCIN 10 UNIT/ML IJ SOLN
INTRAMUSCULAR | Status: AC
  Filled 2011-07-14: qty 4

## 2011-07-14 MED ORDER — KETOROLAC TROMETHAMINE 30 MG/ML IJ SOLN: 30.0000 mg | Freq: Four times a day (QID) | INTRAMUSCULAR | Status: AC | PRN

## 2011-07-14 MED ORDER — OXYTOCIN 20 UNITS IN LACTATED RINGERS INFUSION - SIMPLE
INTRAVENOUS | Status: DC | PRN
  Administered 2011-07-14: 20 [IU] via INTRAVENOUS

## 2011-07-14 MED ORDER — NALOXONE HCL 0.4 MG/ML IJ SOLN: 0.4000 mg | INTRAMUSCULAR | Status: DC | PRN

## 2011-07-14 MED ORDER — DIPHENHYDRAMINE HCL 50 MG/ML IJ SOLN: 25.0000 mg | INTRAMUSCULAR | Status: DC | PRN

## 2011-07-14 MED ORDER — OXYCODONE-ACETAMINOPHEN 5-325 MG PO TABS: 1.0000 | ORAL_TABLET | ORAL | Status: DC | PRN

## 2011-07-14 SURGICAL SUPPLY — 29 items
CHLORAPREP W/TINT 26ML (MISCELLANEOUS) ×2 IMPLANT
CLOTH BEACON ORANGE TIMEOUT ST (SAFETY) ×2 IMPLANT
CONTAINER PREFILL 10% NBF 15ML (MISCELLANEOUS) IMPLANT
DRSG COVADERM 4X10 (GAUZE/BANDAGES/DRESSINGS) ×1 IMPLANT
ELECT REM PT RETURN 9FT ADLT (ELECTROSURGICAL) ×2
ELECTRODE REM PT RTRN 9FT ADLT (ELECTROSURGICAL) ×1 IMPLANT
EXTRACTOR VACUUM BELL STYLE (SUCTIONS) IMPLANT
GLOVE BIO SURGEON STRL SZ7 (GLOVE) ×4 IMPLANT
GOWN PREVENTION PLUS LG XLONG (DISPOSABLE) ×6 IMPLANT
KIT ABG SYR 3ML LUER SLIP (SYRINGE) IMPLANT
NDL HYPO 25X5/8 SAFETYGLIDE (NEEDLE) ×1 IMPLANT
NEEDLE HYPO 25X5/8 SAFETYGLIDE (NEEDLE) ×2 IMPLANT
NS IRRIG 1000ML POUR BTL (IV SOLUTION) ×2 IMPLANT
PACK C SECTION WH (CUSTOM PROCEDURE TRAY) ×2 IMPLANT
RETRACTOR WND ALEXIS 25 LRG (MISCELLANEOUS) ×1 IMPLANT
RTRCTR WOUND ALEXIS 25CM LRG (MISCELLANEOUS) ×2
SLEEVE SCD COMPRESS KNEE MED (MISCELLANEOUS) IMPLANT
STAPLER VISISTAT 35W (STAPLE) IMPLANT
SUT MNCRL 0 VIOLET CTX 36 (SUTURE) ×2 IMPLANT
SUT MONOCRYL 0 CTX 36 (SUTURE) ×2
SUT PDS AB 0 CTX 60 (SUTURE) IMPLANT
SUT PLAIN 2 0 XLH (SUTURE) IMPLANT
SUT VIC AB 0 CT1 27 (SUTURE) ×4
SUT VIC AB 0 CT1 27XBRD ANBCTR (SUTURE) ×2 IMPLANT
SUT VIC AB 2-0 CT1 27 (SUTURE) ×2
SUT VIC AB 2-0 CT1 TAPERPNT 27 (SUTURE) ×1 IMPLANT
TOWEL OR 17X24 6PK STRL BLUE (TOWEL DISPOSABLE) ×4 IMPLANT
TRAY FOLEY CATH 14FR (SET/KITS/TRAYS/PACK) ×2 IMPLANT
WATER STERILE IRR 1000ML POUR (IV SOLUTION) ×1 IMPLANT

## 2011-07-14 NOTE — Progress Notes (Signed)
OR in AICU to take pt to surgery.

## 2011-07-14 NOTE — Anesthesia Procedure Notes (Signed)
Spinal Block  Patient location during procedure: OR Staffing Anesthesiologist: Jiles Garter Preanesthetic Checklist Completed: patient identified, site marked, surgical consent, pre-op evaluation, timeout performed, IV checked, risks and benefits discussed and monitors and equipment checked Spinal Block Patient position: sitting Prep: DuraPrep Patient monitoring: heart rate, cardiac monitor, continuous pulse ox and blood pressure Approach: midline Location: L3-4 Injection technique: single-shot Needle Needle type: Sprotte  Needle gauge: 24 G Needle length: 9 cm Assessment Sensory level: T4 Additional Notes Spinal Dosage in OR  Bupivicaine ml       1.7 PFMS04   mcg        150 Fentanyl mcg            20

## 2011-07-14 NOTE — Anesthesia Postprocedure Evaluation (Signed)
  Anesthesia Post-op Note  Patient: Alison Matthews  Procedure(s) Performed:  CESAREAN SECTION - Repeat   Patient is awake, responsive, moving her legs, and has signs of resolution of her numbness. Pain and nausea are reasonably well controlled. Vital signs are stable and clinically acceptable; no afib , patient should be fine for regular bed and not ICU. Discussed with Henderson Cloud MD Oxygen saturation is clinically acceptable. There are no apparent anesthetic complications at this time. Patient is ready for discharge.

## 2011-07-14 NOTE — Transfer of Care (Signed)
Immediate Anesthesia Transfer of Care Note  Patient: Alison Matthews  Procedure(s) Performed:  CESAREAN SECTION - Repeat  Patient Location: PACU  Anesthesia Type: Spinal  Level of Consciousness: awake, alert  and oriented  Airway & Oxygen Therapy: Patient Spontanous Breathing and Patient connected to nasal cannula oxygen  Post-op Assessment: Report given to PACU RN and Post -op Vital signs reviewed and stable  Post vital signs: stable  Complications: No apparent anesthesia complications

## 2011-07-14 NOTE — Progress Notes (Signed)
There has been no change in the patients history or status since the history and physical.  Filed Vitals:   07/13/11 2000 07/13/11 2100 07/14/11 0009 07/14/11 0529  BP:  130/74 111/63 111/57  Pulse:  74 81   Temp: 97.6 F (36.4 C)  97.5 F (36.4 C) 97.9 F (36.6 C)  TempSrc: Oral  Oral Oral  Resp:  21 20 17   Height:      Weight:      SpO2:  98% 98% 97%   Lungs CTA Cor RRR NST  R  Lab Results  Component Value Date   WBC 7.3 07/13/2011   HGB 12.5 07/13/2011   HCT 38.1 07/13/2011   MCV 89.9 07/13/2011   PLT 223 07/13/2011   ptt 30 For C/S as planned. Bernie Ransford A

## 2011-07-14 NOTE — Progress Notes (Signed)
Monitors off for sleep

## 2011-07-14 NOTE — Consult Note (Signed)
Neonatology Note:  Attendance at C-section:  I was asked to attend this repeat C/S at term. The mother is a 55 yo G2P1 with HTN and atrial fibrillation requiring cardioversion. ROM at delivery, fluid clear. Infant vigorous with good spontaneous cry and tone. Needed only minimal bulb suctioning. Ap 9/9. Lungs clear to ausc in DR. To CN to care of Pediatrician.  Deniese Oberry, MD  

## 2011-07-14 NOTE — Brief Op Note (Signed)
07/14/2011  8:30 AM  PATIENT:  Alison Matthews  55 y.o. female  PRE-OPERATIVE DIAGNOSIS:  Repeat c/s A- fib cardioverted x2 this preg 9/29 last time htn lovenox pt  POST-OPERATIVE DIAGNOSIS:  Previous Cesarean Section  PROCEDURE:  Procedure(s): CESAREAN SECTION  SURGEON:  Surgeon(s): Loney Laurence  PHYSICIAN ASSISTANT:   ASSISTANTS: Dr. Claiborne Billings   ANESTHESIA:   spinal  OR FLUID I/O:  Total I/O In: 2000 [I.V.:2000] Out: 850 [Urine:150; Blood:700]  BLOOD ADMINISTERED:none   SPECIMEN:  No Specimen  DISPOSITION OF SPECIMEN:  N/A  COUNTS:  YES  DICTATION: .see below  PLAN OF CARE: Admit to inpatient   PATIENT DISPOSITION:  PACU - hemodynamically stable.   Delay start of Pharmacological VTE agent (>24hrs) due to surgical blood loss or risk of bleeding:  not applicable  Complications:  none Medications:  Ancef, Pitocin Findings:  Baby female, Apgars 9,9, weight P.   Normal tubes, ovaries and uterus seen.  Technique:  After adequate spinal anesthesia was achieved, the patient was prepped and draped in usual sterile fashion.  A foley catheter was used to drain the bladder.  A pfannanstiel incision was made with the scalpel and carried down to the fascia with the bovie cautery. The fascia was incised in the midline with the scalpel and carried in a transverse curvilinear manner bilaterally.  The fascia was reflected superiorly and inferiorly off the rectus muscles and the muscles split in the midline.  A bowel free portion of the peritoneum was entered bluntly and then extended in a superior and inferior manner with good visualization of the bowel and bladder.  The Alexis instrument was then placed and the vesico-uterine fascia tented up and incised in a transverse curvilinear manner.  A 2 cm incision was made in the upper portion of the lower uterine segment until the amnion was exposed.  Clear fluid was noted and the baby delivered in the vertex presentation without  complication.  The baby was bulb suctioned and the cord was clamped and cut.  The baby was then handed to awaiting Neonatology.  The placenta was then delivered manually and the uterus cleared of all debris.  The uterine incision was then closed with a running lock stitch of 0 monocryl.  An imbricating layer of 0 monocryl was closed as well. Good hemostasis of the uterine incision was achieved, the abdomen was cleared with irrigation and the peritoneum was closed with a running stitch of 2-0 vicryl.  This incorporated the rectus muscles as a separate layer.  The fascia was then closed with a running stitch of 0 vicryl.  The subcutaneous layer was closed with interrupted  stitches of 2-0 plain gut.  The skin was closed with staples.  The patient tolerated the procedure well and was returned to the recovery room in stable condition.  All counts were correct times three.  Brittnei Jagiello A

## 2011-07-15 ENCOUNTER — Telehealth: Payer: Self-pay | Admitting: Internal Medicine

## 2011-07-15 ENCOUNTER — Encounter (HOSPITAL_COMMUNITY): Payer: Self-pay | Admitting: Obstetrics and Gynecology

## 2011-07-15 LAB — CBC
HCT: 36.6 % (ref 36.0–46.0)
Hemoglobin: 11.8 g/dL — ABNORMAL LOW (ref 12.0–15.0)
MCH: 29.4 pg (ref 26.0–34.0)
MCHC: 32.2 g/dL (ref 30.0–36.0)
MCV: 91.3 fL (ref 78.0–100.0)
Platelets: 237 10*3/uL (ref 150–400)
RBC: 4.01 MIL/uL (ref 3.87–5.11)
RDW: 14.5 % (ref 11.5–15.5)
WBC: 9.5 10*3/uL (ref 4.0–10.5)

## 2011-07-15 MED ORDER — LABETALOL HCL 200 MG PO TABS
200.0000 mg | ORAL_TABLET | Freq: Two times a day (BID) | ORAL | Status: DC
  Administered 2011-07-15 – 2011-07-17 (×4): 200 mg via ORAL
  Filled 2011-07-15 (×4): qty 1

## 2011-07-15 NOTE — Progress Notes (Signed)
POD#1 Pt without c/o. Lochia-wnl. Ambulating well. Tolerating diet. VVSAF IMP/ stable Plan/ rountine post op care.

## 2011-07-15 NOTE — Telephone Encounter (Signed)
Spoke with Dr. Henderson Cloud OK to D/C Lovenox.   Tell patient to call if has recurrent palptations.

## 2011-07-15 NOTE — Telephone Encounter (Signed)
Per Dr. Henderson Cloud,. Is it ok for patient to stop Lovenox. S/p delivery mom & baby are doing fine.

## 2011-07-15 NOTE — Telephone Encounter (Signed)
Dr.Horvath called wanting to know if should continue Lovenox. Ms. Lewter delivered yesterday. Per office note by Dr. Tenny Craw on 6/4 she states OB physician wants to continue Lovenox 40 mg BID x 6 wks. Dr. Henderson Cloud wants to know if OK to stop Lovenox. Please advise.

## 2011-07-16 ENCOUNTER — Other Ambulatory Visit (HOSPITAL_COMMUNITY): Payer: BC Managed Care – PPO

## 2011-07-16 NOTE — Progress Notes (Signed)
I discussed with Dr. Dietrich Pates of Cards and patient no longer needs lovenox.  The patient is aware of when she goes into Afib or SVT and will contact Dr. Tenny Craw in that instance.  Alison Matthews

## 2011-07-16 NOTE — Progress Notes (Signed)
Subjective: Postpartum Day 2: Cesarean Delivery Patient reports no complaints  Objective: Vital signs in last 24 hours: Filed Vitals:   07/15/11 1359 07/15/11 2156 07/15/11 2222 07/16/11 0601  BP: 148/81 137/81 136/80 124/78  Pulse: 64 67 74 80  Temp: 98 F (36.7 C) 98.1 F (36.7 C)  98.7 F (37.1 C)  TempSrc: Oral Oral  Oral  Resp: 20 18  18   Height:      Weight:      SpO2:        Physical Exam:  General: AOX3, NAD Lochia: appropriate Uterine Fundus: firm Incision: C/D/I DVT Evaluation: no evidence DVT   Basename 07/15/11 0505 07/13/11 1606  HGB 11.8* 12.5  HCT 36.6 38.1    Assessment/Plan: Status post Cesarean section. Doing well, routine postpartum care  Ynez Eugenio H. 07/16/2011, 7:57 AM

## 2011-07-17 MED ORDER — OXYCODONE-ACETAMINOPHEN 5-325 MG PO TABS: 1.0000 | ORAL_TABLET | ORAL | Status: AC | PRN

## 2011-07-17 NOTE — Progress Notes (Signed)
  Patient is eating, ambulating, voiding.  Pain control is good.  Filed Vitals:   07/16/11 0601 07/16/11 1344 07/16/11 2130 07/17/11 0646  BP: 124/78 111/65 143/81 119/75  Pulse: 80 79 74 78  Temp: 98.7 F (37.1 C) 98.2 F (36.8 C) 97.8 F (36.6 C) 98.3 F (36.8 C)  TempSrc: Oral Oral Oral Oral  Resp: 18 18 20 20   Height:      Weight:      SpO2:    98%    lungs:   clear to auscultation cor:    RRR Abdomen:  soft, appropriate tenderness, incisions intact and without erythema or exudate ex:    no cords   Lab Results  Component Value Date   WBC 9.5 07/15/2011   HGB 11.8* 07/15/2011   HCT 36.6 07/15/2011   MCV 91.3 07/15/2011   PLT 237 07/15/2011    --/--/O POS (10/07 1605)/RI  A/P    Post operative day 3.  Routine post op and postpartum care.  Expect d/c per plan.  D/C staples.  Percocet for pain control.

## 2011-07-17 NOTE — Discharge Summary (Signed)
Obstetric Discharge Summary Reason for Admission: cesarean section Prenatal Procedures: NST, telemetry for Afib/SVT, Cardioversion x2 Intrapartum Procedures: cesarean: low cervical, transverse Postpartum Procedures: none Complications-Operative and Postpartum: none Hemoglobin  Date Value Range Status  07/15/2011 11.8* 12.0-15.0 (g/dL) Final     HCT  Date Value Range Status  07/15/2011 36.6  36.0-46.0 (%) Final   Hospital Course:  Pt was admitted per MFM o/n for telemetry and fetal monitoring because of recent episode of Afib requiring cardioversion.  Mother and fetus were stable o/n without problems.  On recommendation of MFM, repeat c/s was moved to 37 4/7 weeks because of risks to fetus if another prolonged Afib were to occur.  On HD #2 pt underwent uncomplicated repeat c/s and delivery of healthy girl.  Postoperative course was completely routine and pt d/ced on POD #3.  Discharge Diagnoses: Term Pregnancy-delivered Discharge Information: Date: 07/17/2011 Activity: pelvic rest Diet: routine Medications: Percocet Condition: stable Instructions: refer to practice specific booklet Discharge to: home Follow-up Information    Follow up with Shanon Becvar A. Make an appointment in 2 weeks.   Contact information:   [address] [place] [address] [phone]          Newborn Data: Live born female  Birth Weight: 5 lb 8.9 oz (2520 g) APGAR: 9, 9  Home with mother.  Dedee Liss A 07/17/2011, 7:51 AM

## 2011-07-31 ENCOUNTER — Encounter: Payer: Self-pay | Admitting: Internal Medicine

## 2011-07-31 ENCOUNTER — Ambulatory Visit (INDEPENDENT_AMBULATORY_CARE_PROVIDER_SITE_OTHER): Payer: BC Managed Care – PPO | Admitting: Internal Medicine

## 2011-07-31 DIAGNOSIS — I10 Essential (primary) hypertension: Secondary | ICD-10-CM

## 2011-07-31 DIAGNOSIS — I4891 Unspecified atrial fibrillation: Secondary | ICD-10-CM

## 2011-07-31 DIAGNOSIS — I498 Other specified cardiac arrhythmias: Secondary | ICD-10-CM

## 2011-07-31 DIAGNOSIS — E785 Hyperlipidemia, unspecified: Secondary | ICD-10-CM | POA: Insufficient documentation

## 2011-07-31 DIAGNOSIS — I471 Supraventricular tachycardia: Secondary | ICD-10-CM

## 2011-07-31 NOTE — Assessment & Plan Note (Signed)
BP is good.  It had been high before  Continue labetalol.

## 2011-07-31 NOTE — Assessment & Plan Note (Signed)
SInce I saw the patient this summer she has had 1 more spell.  Cardioverted. Now I would continue Labetalol.  Will check on Aspirin. I would like to see her in the winter.  Call if palpitations recur.

## 2011-07-31 NOTE — Progress Notes (Signed)
HPI Patient is a 55 year old with a history of SVT and atrial fibrillation.  I last saw her in the summer. She had a recurrence of atrial fibrillation several wks ago.  She was at home.  Felt her heart racing.  Went to [facility]. There she underwent DC cardicoversion.  Started on lovenox for 1 wk A week later she underwnt delivery.  Procedure not complicated. She has not had any recurrence. No Known Allergies  Current Outpatient Prescriptions  Medication Sig Dispense Refill  . labetalol (NORMODYNE) 200 MG tablet Take 200 mg by mouth 2 (two) times daily.        . Prenatal Vit-Fe Fumarate-FA (PRENAPLUS PO) Take by mouth daily.          Past Medical History  Diagnosis Date  . Postmenopausal   . Tachycardia   . HTN (hypertension)   . Morton's neuroma   . Shoulder pain, left   . Breast pain     right  . Foot pain     bilateral  . Tingling     Past Surgical History  Procedure Date  . Cesarean section 10/10  . Polypectomy 9/09    Uterine  . Wisdom tooth extraction 8/76  . 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 Cataract extraction 2/10  . Cesarean section 07/14/2011    Procedure: CESAREAN SECTION;  Surgeon: Loney Laurence;  Location: [facility];  Service: Gynecology;  Laterality: N/A;  Repeat    Family History  Problem Relation Age of Onset  . Hypertension    . Osteoporosis    . Arthritis      History   Social History  . Marital Status: Married    Spouse Name: N/A    Number of Children: N/A  . Years of Education: N/A   Occupational History  . Not on file.   Social History Main Topics  . Smoking status: Never Smoker   . Smokeless tobacco: Not on file  . Alcohol Use: No  . Drug Use: No  . Sexually Active: Not on file   Other Topics Concern  . Not on file   Social History Narrative  . No narrative on file    Review of Systems:  All systems reviewed.  They are negative to the above problem except as previously stated.  Vital  Signs: BP 113/72  Pulse 69  Ht 5\' 7"  (1.702 m)  Wt 210 lb (95.255 kg)  BMI 32.89 kg/m2  Physical Exam  Patient is in NAD HEENT:  Normocephalic, atraumatic. EOMI, PERRLA.  Neck: JVP is normal. No thyromegaly. No bruits.  Lungs: clear to auscultation. No rales no wheezes.  Heart: Regular rate and rhythm. Normal S1, S2. No S3.   No significant murmurs. PMI not displaced.  Abdomen:  Supple, nontender. Normal bowel sounds. No masses. No hepatomegaly.  Extremities:   Good distal pulses throughout. No lower extremity edema.  Musculoskeletal :moving all extremities.  Neuro:   alert and oriented x3.  CN II-XII grossly intact.   Assessment and Plan:

## 2011-07-31 NOTE — Patient Instructions (Signed)
Your physician recommends that you schedule a follow-up appointment in: January 2013.

## 2011-07-31 NOTE — Assessment & Plan Note (Signed)
No recent recurrence of reentry rhythm.  Folllow.

## 2011-07-31 NOTE — Assessment & Plan Note (Signed)
Will need to review diet.

## 2011-09-05 ENCOUNTER — Other Ambulatory Visit: Payer: Self-pay | Admitting: Nurse Practitioner

## 2011-10-02 ENCOUNTER — Other Ambulatory Visit: Payer: Self-pay

## 2011-10-02 ENCOUNTER — Emergency Department (INDEPENDENT_AMBULATORY_CARE_PROVIDER_SITE_OTHER): Payer: BC Managed Care – PPO

## 2011-10-02 ENCOUNTER — Emergency Department (HOSPITAL_BASED_OUTPATIENT_CLINIC_OR_DEPARTMENT_OTHER)
Admission: EM | Admit: 2011-10-02 | Discharge: 2011-10-02 | Disposition: A | Discharge status: Home / Self Care | Payer: BC Managed Care – PPO | Attending: Emergency Medicine | Admitting: Emergency Medicine

## 2011-10-02 ENCOUNTER — Encounter (HOSPITAL_BASED_OUTPATIENT_CLINIC_OR_DEPARTMENT_OTHER): Payer: Self-pay

## 2011-10-02 ENCOUNTER — Telehealth: Payer: Self-pay | Admitting: Internal Medicine

## 2011-10-02 DIAGNOSIS — R079 Chest pain, unspecified: Secondary | ICD-10-CM

## 2011-10-02 DIAGNOSIS — I10 Essential (primary) hypertension: Secondary | ICD-10-CM | POA: Insufficient documentation

## 2011-10-02 DIAGNOSIS — J069 Acute upper respiratory infection, unspecified: Secondary | ICD-10-CM | POA: Insufficient documentation

## 2011-10-02 NOTE — ED Provider Notes (Signed)
History     CSN: [id number]  Arrival date & time 10/02/11  1513   First MD Initiated Contact with Patient 10/02/11 1657      Chief Complaint  Patient presents with  . URI    (Consider location/radiation/quality/duration/timing/severity/associated sxs/prior treatment) Patient is a 55 y.o. female presenting with URI. The history is provided by the patient. No language interpreter was used.  URI The primary symptoms include fever and sore throat. The current episode started 6 to 7 days ago. This is a new problem. The problem has not changed since onset. The maximum temperature recorded prior to her arrival was unknown.  The sore throat began today. The sore throat has been unchanged since its onset. The sore throat is mild in intensity. The sore throat is accompanied by trouble swallowing.  The onset of the illness is associated with exposure to sick contacts. Symptoms associated with the illness include congestion and rhinorrhea. The illness is not associated with chills or sinus pressure. Risk factors: atrial fib.  Pt reports she has had a cough for several days.  Pt has a 61 month old at home.  Pt has a hx of atrial fibrillation twice during pregnancy  Past Medical History  Diagnosis Date  . Postmenopausal   . Tachycardia   . HTN (hypertension)   . Morton's neuroma   . Shoulder pain, left   . Breast pain     right  . Foot pain     bilateral  . Tingling     Past Surgical History  Procedure Date  . Cesarean section 10/10  . Polypectomy 9/09    Uterine  . Wisdom tooth extraction 8/76  . 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 Cataract extraction 2/10  . Cesarean section 07/14/2011    Procedure: CESAREAN SECTION;  Surgeon: Loney Laurence;  Location: [facility];  Service: Gynecology;  Laterality: N/A;  Repeat    Family History  Problem Relation Age of Onset  . Hypertension    . Osteoporosis    . Arthritis      History  Substance Use Topics  .  Smoking status: Never Smoker   . Smokeless tobacco: Not on file  . Alcohol Use: No    OB History    Grav Para Term Preterm Abortions TAB SAB Ect Mult Living   2 2 2  0 0 0 0 0 0 2      Review of Systems  Constitutional: Positive for fever. Negative for chills.  HENT: Positive for congestion, sore throat, rhinorrhea and trouble swallowing. Negative for sinus pressure.   All other systems reviewed and are negative.    Allergies  Review of patient's allergies indicates no known allergies.  Home Medications   Current Outpatient Rx  Name Route Sig Dispense Refill  . ACETAMINOPHEN 500 MG PO TABS Oral Take 1,000 mg by mouth every 6 (six) hours as needed. For pain      . CHLORPHENIRAMINE-ACETAMINOPHEN 2-325 MG PO TABS Oral Take 2 tablets by mouth every 6 (six) hours as needed. For cough and cold symptoms     . GUAIFENESIN 100 MG/5ML PO LIQD Oral Take 600 mg by mouth every 6 (six) hours as needed. For cough     . LABETALOL HCL 200 MG PO TABS  TAKE 1 TABLET BY MOUTH TWICE DAILY 60 tablet 2  . PRENAPLUS PO Oral Take by mouth daily.        BP 128/80  Pulse 82  Temp(Src) 99.4  F (37.4 C) (Oral)  Resp 15  Ht 5\' 7"  (1.702 m)  Wt 220 lb (99.791 kg)  BMI 34.46 kg/m2  SpO2 97%  Physical Exam  Nursing note and vitals reviewed. Constitutional: She is oriented to person, place, and time. She appears well-developed and well-nourished.  HENT:  Head: Normocephalic and atraumatic.  Right Ear: External ear normal.  Left Ear: External ear normal.  Nose: Nose normal.  Mouth/Throat: Oropharynx is clear and moist.  Eyes: Conjunctivae and EOM are normal. Pupils are equal, round, and reactive to light.  Neck: Normal range of motion. Neck supple.  Cardiovascular: Normal rate and normal heart sounds.   Pulmonary/Chest: Effort normal.  Abdominal: Soft.  Musculoskeletal: Normal range of motion.  Neurological: She is alert and oriented to person, place, and time. She has normal reflexes.  Skin:  Skin is warm.  Psychiatric: She has a normal mood and affect.    ED Course  Procedures (including critical care time)  Labs Reviewed - No data to display Dg Chest 2 View  10/02/2011  *RADIOLOGY REPORT*  Clinical Data: Chest pain.  Nonsmoker.  CHEST - 2 VIEW  Comparison: 07/23/2010  Findings: Midline trachea. Normal heart size and mediastinal contours. No pleural effusion or pneumothorax.  Mild volume loss at the lung bases.  Lungs otherwise clear.  IMPRESSION: No acute cardiopulmonary disease.  Original Report Authenticated By: Consuello Bossier, M.D.     No diagnosis found.    MDM   Results for orders placed during the hospital encounter of 07/13/11  CBC      Component Value Range   WBC 7.3  4.0 - 10.5 (K/uL)   RBC 4.24  3.87 - 5.11 (MIL/uL)   Hemoglobin 12.5  12.0 - 15.0 (g/dL)   HCT 11.9  14.7 - 82.9 (%)   MCV 89.9  78.0 - 100.0 (fL)   MCH 29.5  26.0 - 34.0 (pg)   MCHC 32.8  30.0 - 36.0 (g/dL)   RDW 56.2  13.0 - 86.5 (%)   Platelets 223  150 - 400 (K/uL)  DIFFERENTIAL      Component Value Range   Neutrophils Relative 65  43 - 77 (%)   Neutro Abs 4.7  1.7 - 7.7 (K/uL)   Lymphocytes Relative 24  12 - 46 (%)   Lymphs Abs 1.7  0.7 - 4.0 (K/uL)   Monocytes Relative 10  3 - 12 (%)   Monocytes Absolute 0.7  0.1 - 1.0 (K/uL)   Eosinophils Relative 1  0 - 5 (%)   Eosinophils Absolute 0.1  0.0 - 0.7 (K/uL)   Basophils Relative 0  0 - 1 (%)   Basophils Absolute 0.0  0.0 - 0.1 (K/uL)  COMPREHENSIVE METABOLIC PANEL      Component Value Range   Sodium 134 (*) 135 - 145 (mEq/L)   Potassium 3.8  3.5 - 5.1 (mEq/L)   Chloride 104  96 - 112 (mEq/L)   CO2 21  19 - 32 (mEq/L)   Glucose, Bld 95  70 - 99 (mg/dL)   BUN 13  6 - 23 (mg/dL)   Creatinine, Ser 7.84  0.50 - 1.10 (mg/dL)   Calcium 9.9  8.4 - 69.6 (mg/dL)   Total Protein 6.4  6.0 - 8.3 (g/dL)   Albumin 2.7 (*) 3.5 - 5.2 (g/dL)   AST 39 (*) 0 - 37 (U/L)   ALT 22  0 - 35 (U/L)   Alkaline Phosphatase 108  39 - 117 (U/L)  Total Bilirubin 0.2 (*) 0.3 - 1.2 (mg/dL)   GFR calc non Af Amer 81 (*) >90 (mL/min)   GFR calc Af Amer >90  >90 (mL/min)  RPR      Component Value Range   RPR NON REACTIVE  NON REACTIVE   TYPE AND SCREEN      Component Value Range   ABO/RH(D) O POS     Antibody Screen NEG     Sample Expiration 07/16/2011    APTT      Component Value Range   aPTT 29  24 - 37 (seconds)  HCG, SERUM, QUALITATIVE      Component Value Range   Preg, Serum POSITIVE (*) NEGATIVE   APTT      Component Value Range   aPTT 30  24 - 37 (seconds)  ABO/RH      Component Value Range   ABO/RH(D) O POS    MRSA PCR SCREENING      Component Value Range   MRSA by PCR NEGATIVE  NEGATIVE   CBC      Component Value Range   WBC 9.5  4.0 - 10.5 (K/uL)   RBC 4.01  3.87 - 5.11 (MIL/uL)   Hemoglobin 11.8 (*) 12.0 - 15.0 (g/dL)   HCT 16.1  09.6 - 04.5 (%)   MCV 91.3  78.0 - 100.0 (fL)   MCH 29.4  26.0 - 34.0 (pg)   MCHC 32.2  30.0 - 36.0 (g/dL)   RDW 40.9  81.1 - 91.4 (%)   Platelets 237  150 - 400 (K/uL)   Dg Chest 2 View  10/02/2011  *RADIOLOGY REPORT*  Clinical Data: Chest pain.  Nonsmoker.  CHEST - 2 VIEW  Comparison: 07/23/2010  Findings: Midline trachea. Normal heart size and mediastinal contours. No pleural effusion or pneumothorax.  Mild volume loss at the lung bases.  Lungs otherwise clear.  IMPRESSION: No acute cardiopulmonary disease.  Original Report Authenticated By: Consuello Bossier, M.D.    Date: 10/02/2011  Rate: 64  Rhythm: normal sinus rhythm  QRS Axis: normal  Intervals: normal  ST/T Wave abnormalities: normal  Conduction Disutrbances:none  Narrative Interpretation:   Old EKG Reviewed: unchanged    I did ekg to make sure pt has a sinus rythem.  Chest xray is normal,  No pneumonia,  No fluid    Langston Masker, [place] 10/02/11 1927  Langston Masker, [place] 10/02/11 1929  Langston Masker, [place] 10/02/11 1929

## 2011-10-02 NOTE — Telephone Encounter (Signed)
Spoke to pt- Runny nose x 2 days, taking clorcidin cold and flu and robitussin dm. Temp 99.5 that comes and goes. Some SOB and Wheezing. Pt feels faint and has to set down. Sore throat. Coughing some. This started 2 days ago. Pt did get a flu shot.  Pt is unsure about her BP. Pt states that she had afib x 2. Advise pt to [facility] on [address] or a Urgent Care.

## 2011-10-02 NOTE — Telephone Encounter (Signed)
Pt called and has a bad cough and cold. Pt has chest congestion. Pt has a 18 month old baby at home and is concerned that she may pass sickness onto her child. Pt is breast feeding the baby. Pt req work in ov to see Dr Fabian Sharp today.

## 2011-10-02 NOTE — ED Notes (Signed)
Pt c/o cold SX. Runny nose, dry cough, sore throat, body aches, congestion.

## 2011-10-02 NOTE — Telephone Encounter (Signed)
Was  to offered appt in am but she  Reported to have  lightheaded and near syncope sx .. didn't have bp cuff  To check  Status of vs ; On meds for bp and recurrent afib.   No provider availability in office to see her this past afternoon and felt shouldn't wait until tomorrow .  Marland Kitchen

## 2011-10-03 ENCOUNTER — Telehealth: Payer: Self-pay | Admitting: *Deleted

## 2011-10-03 NOTE — Telephone Encounter (Signed)
Pt was seen at the [facility]. CXR was normal. According to there note she has a viral infection and antibiotics would not help. Pt called today saying that she is coughing up green stuff and would like a z-pac called in. Pt does not have a fever. Please advise.

## 2011-10-03 NOTE — ED Provider Notes (Signed)
Medical screening examination/treatment/procedure(s) were performed by non-physician practitioner and as supervising physician I was immediately available for consultation/collaboration.  Jarius Dieudonne R. Sunnie Odden, MD 10/03/11 0046 

## 2011-10-03 NOTE — Telephone Encounter (Signed)
Per Dr. Fabian Sharp- Just because it's green doesn't mean it's bacterial. Antibiotics won't help a viral infection. Most last up to 10 days. But call us next week or if you get a fever 101, sob or coughing up blood. Pt aware of this.

## 2011-10-06 ENCOUNTER — Ambulatory Visit (INDEPENDENT_AMBULATORY_CARE_PROVIDER_SITE_OTHER): Payer: BC Managed Care – PPO | Admitting: Internal Medicine

## 2011-10-06 ENCOUNTER — Encounter: Payer: Self-pay | Admitting: Internal Medicine

## 2011-10-06 VITALS — BP 100/70 | HR 89 | Temp 98.3°F | Wt 221.0 lb

## 2011-10-06 DIAGNOSIS — J329 Chronic sinusitis, unspecified: Secondary | ICD-10-CM

## 2011-10-06 DIAGNOSIS — J069 Acute upper respiratory infection, unspecified: Secondary | ICD-10-CM

## 2011-10-06 DIAGNOSIS — I10 Essential (primary) hypertension: Secondary | ICD-10-CM

## 2011-10-06 MED ORDER — AMOXICILLIN-POT CLAVULANATE 875-125 MG PO TABS: 1.0000 | ORAL_TABLET | Freq: Two times a day (BID) | ORAL | Status: AC

## 2011-10-06 NOTE — Progress Notes (Signed)
  Subjective:    Patient ID: Alison Matthews, female    DOB: 02/28/1956, 55 y.o.   MRN: [id number]  HPI Patient comes in today for SDA  For acute problem evaluation.  Has had rti sx for 8 days and getting worse.  See phone note  Like a cough and head cold with extreme malaise and lightheaded at onset . Was seen in ed as instructed and felt to be viral cause  12 27 . cxray normal Of not had 2 episodes of af in pregnancy that required cardioversion. Child delivered at 37.5 weeks and doing well nursing   But no pumping to avoid having infant get sick she is  Now about 2 months old. Mom did have flu shot    Last night thought was getting better and then today  worse today with increase congestion  In  head . Thick copious yellow nasal drainage  Not a lot of coughing as above   Used otc coricidin   In the past .  Some help.  And robitussin dm.  Worse than a head cold  Feverish feeling with temperature   Getting low grade 99.5   Feels feverish and then sweaty.    No sob. No afib   rotating sitte  rReview of Systems No cp sob currently nvd new rashes rest as per hpi  bp has been ook continuing on labetolol  Past history family history social history reviewed in the electronic medical record.      Objective:   Physical Exam WDWN in NAD  quiet respirations; mod  congested  somewhat hoarse. Non toxic .looks tired  HEENT: Normocephalic ;atraumatic , Eyes;  PERRL, EOMs  Full, lids and conjunctiva clear,,Ears: no deformities, canals nl, TM landmarks normal, Nose: no deformity yellow dc on right  congested;face minimally tender Mouth : OP clear without lesion or edema . Neck: Supple without adenopathy or masses or bruits Chest:  Clear to A&P without wheezes rales or rhonchi CV:  S1-S2 no gallops or murmurs peripheral perfusion is normal Skin :nl perfusion and no acute rashes  Reviewed ed record      Assessment & Plan:  Acute uri  Worsening after initial improvement  Still could resolve  With local  measures   However if not improving in next few days  Can add antibiotic for poss bact sinusitis. She is nursing  Disc risk benefit  To infant .      Expectant management. Call or seek care  if  persistent or alarm symptoms as discussed.  HT stabale Hx of PAF in pregnancy

## 2011-10-06 NOTE — Patient Instructions (Signed)
This acts like a sinus infection  On top of the original viral infection.  This still could resolve on its own.but can treat for bacterial sinsusitis. Take med for 10 days expect improvement in the next 3 - 5 day.

## 2011-10-07 ENCOUNTER — Encounter: Payer: Self-pay | Admitting: Internal Medicine

## 2011-10-15 ENCOUNTER — Ambulatory Visit (INDEPENDENT_AMBULATORY_CARE_PROVIDER_SITE_OTHER): Payer: BC Managed Care – PPO | Admitting: Internal Medicine

## 2011-10-15 ENCOUNTER — Encounter: Payer: Self-pay | Admitting: Internal Medicine

## 2011-10-15 ENCOUNTER — Telehealth: Payer: Self-pay | Admitting: Internal Medicine

## 2011-10-15 DIAGNOSIS — I4891 Unspecified atrial fibrillation: Secondary | ICD-10-CM

## 2011-10-15 DIAGNOSIS — I10 Essential (primary) hypertension: Secondary | ICD-10-CM

## 2011-10-15 MED ORDER — FLECAINIDE ACETATE 100 MG PO TABS: ORAL_TABLET | ORAL | Status: DC

## 2011-10-15 NOTE — Telephone Encounter (Signed)
Patient called.  Said she was driving to [facility] this AM when she sensed her heart beating fast.  Started feeling dizzy, vision changed some.  Did not pass out (she has not had dizziness/syncope with previous episodes).  Came home. No longer dizzy.  Checked pulse  120s to 160s.  Thinks she is in afib. Rec:  Take extra labetalol 200mg  Husband will drive her to office Discussed with Rosette Reveal.

## 2011-10-15 NOTE — Assessment & Plan Note (Addendum)
Her atrial fib has returned. We discussed the treatment options. I have recommended using flecainide 100 mg two tabs now. If she does not convert to NSR or if she develops chest pain, sob, or syncope, then she is instructed to return to the emergency room. If she does not revert to NSR, then we will plan to proceed with DCCV in the a.m. She will receive lovenox today.

## 2011-10-15 NOTE — Patient Instructions (Signed)
Call tomorrow if still out of rhythm and will set up for a Cardioversion  Your physician has recommended you make the following change in your medication: 1) Take Flecainide 100mg  2 tablets by mouth for prolonged palpitations

## 2011-10-15 NOTE — Progress Notes (Signed)
HPI Alison Matthews is referred today by Dr. Tenny Craw. She is a pleasant 56 year old woman with a history of paroxysmal atrial fibrillation. She also has hypertension. The patient developed atrial fibrillation initially while she was [redacted] weeks pregnant approximately 9 months ago. Ultimately, she underwent cardioversion. Atrial fibrillation returned when she was [redacted] weeks pregnant and she had a repeat cardioversion. She has done well since then. Her baby is now 13 months old. Her today, she experienced palpitations and near syncope. She has a cardiac monitor that she wears which records her heartrate and she noted a heart rate of over 150 beats per minute. After her initial episode of near syncope, she has been stable. She denies chest pain, shortness of breath, but she does feel palpitations. She is certain that the episode began later this morning. She took an additional dose of her beta blocker and is here for additional evaluation. No Known Allergies   Current Outpatient Prescriptions  Medication Sig Dispense Refill  . acetaminophen (TYLENOL) 500 MG tablet Take 1,000 mg by mouth every 6 (six) hours as needed. For pain        . amoxicillin-clavulanate (AUGMENTIN) 875-125 MG per tablet Take 1 tablet by mouth every 12 (twelve) hours.  20 tablet  0  . labetalol (NORMODYNE) 200 MG tablet TAKE 1 TABLET BY MOUTH TWICE DAILY  60 tablet  2  . omeprazole (PRILOSEC) 20 MG capsule Take 20 mg by mouth daily.      . Prenatal Vit-Fe Fumarate-FA (PRENAPLUS PO) Take by mouth daily.        . flecainide (TAMBOCOR) 100 MG tablet Take 2 tablets by mouth for prolonged palpitations  20 tablet  0     Past Medical History  Diagnosis Date  . Postmenopausal   . Tachycardia   . HTN (hypertension)   . Morton's neuroma   . Shoulder pain, left   . Breast pain     right  . Foot pain     bilateral  . Tingling   . Paroxysmal atrial fibrillation     hx in preg rx cv    ROS:   All systems reviewed and negative except as  noted in the HPI.   Past Surgical History  Procedure Date  . Cesarean section 10/10  . Polypectomy 9/09    Uterine  . Wisdom tooth extraction 8/76  . 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 Cataract extraction 2/10  . Cesarean section 07/14/2011    Procedure: CESAREAN SECTION;  Surgeon: Loney Laurence;  Location: [facility];  Service: Gynecology;  Laterality: N/A;  Repeat     Family History  Problem Relation Age of Onset  . Hypertension    . Osteoporosis    . Arthritis       History   Social History  . Marital Status: Married    Spouse Name: N/A    Number of Children: N/A  . Years of Education: N/A   Occupational History  . Not on file.   Social History Main Topics  . Smoking status: Never Smoker   . Smokeless tobacco: Not on file  . Alcohol Use: No  . Drug Use: No  . Sexually Active: Not on file   Other Topics Concern  . 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 HEART:  IRegular tachycardic rhythm, no murmurs, no rubs, no clicks Abd:  soft, positive bowel sounds, no organomegally, no rebound, no guarding Ext:  2 plus pulses, no edema, no cyanosis, no clubbing Skin:  No rashes no nodules Neuro:  CN II through XII intact, motor grossly intact  EKG Atrial fibrillation with a rapid ventricular respons   Assess/Plan:

## 2011-10-15 NOTE — Assessment & Plan Note (Signed)
HER blood pressure is well controlled.

## 2011-10-17 ENCOUNTER — Ambulatory Visit: Payer: BC Managed Care – PPO | Admitting: Internal Medicine

## 2011-12-23 ENCOUNTER — Encounter (HOSPITAL_COMMUNITY): Payer: Self-pay | Admitting: Emergency Medicine

## 2011-12-23 ENCOUNTER — Other Ambulatory Visit: Payer: Self-pay

## 2011-12-23 ENCOUNTER — Inpatient Hospital Stay (HOSPITAL_COMMUNITY): Payer: BC Managed Care – PPO

## 2011-12-23 ENCOUNTER — Inpatient Hospital Stay (HOSPITAL_COMMUNITY)
Admission: EM | Admit: 2011-12-23 | Discharge: 2011-12-25 | DRG: 112 | Disposition: A | Discharge status: Home / Self Care | Payer: BC Managed Care – PPO | Source: Ambulatory Visit | Attending: Cardiovascular Disease | Admitting: Cardiovascular Disease

## 2011-12-23 DIAGNOSIS — G5760 Lesion of plantar nerve, unspecified lower limb: Secondary | ICD-10-CM | POA: Diagnosis present

## 2011-12-23 DIAGNOSIS — I498 Other specified cardiac arrhythmias: Principal | ICD-10-CM | POA: Diagnosis present

## 2011-12-23 DIAGNOSIS — Z780 Asymptomatic menopausal state: Secondary | ICD-10-CM

## 2011-12-23 DIAGNOSIS — M25519 Pain in unspecified shoulder: Secondary | ICD-10-CM | POA: Diagnosis present

## 2011-12-23 DIAGNOSIS — I10 Essential (primary) hypertension: Secondary | ICD-10-CM | POA: Diagnosis present

## 2011-12-23 DIAGNOSIS — I471 Supraventricular tachycardia, unspecified: Secondary | ICD-10-CM

## 2011-12-23 DIAGNOSIS — N644 Mastodynia: Secondary | ICD-10-CM | POA: Diagnosis present

## 2011-12-23 DIAGNOSIS — I4891 Unspecified atrial fibrillation: Secondary | ICD-10-CM

## 2011-12-23 HISTORY — DX: Supraventricular tachycardia, unspecified: I47.10

## 2011-12-23 HISTORY — DX: Supraventricular tachycardia: I47.1

## 2011-12-23 LAB — CBC
HCT: 44.8 % (ref 36.0–46.0)
HCT: 40.5 % (ref 36.0–46.0)
Hemoglobin: 14.8 g/dL (ref 12.0–15.0)
Hemoglobin: 13.4 g/dL (ref 12.0–15.0)
MCH: 29.3 pg (ref 26.0–34.0)
MCH: 29.3 pg (ref 26.0–34.0)
MCHC: 33 g/dL (ref 30.0–36.0)
MCHC: 33.1 g/dL (ref 30.0–36.0)
MCV: 88.7 fL (ref 78.0–100.0)
MCV: 88.6 fL (ref 78.0–100.0)
Platelets: 244 10*3/uL (ref 150–400)
Platelets: 230 10*3/uL (ref 150–400)
RBC: 5.05 MIL/uL (ref 3.87–5.11)
RBC: 4.57 MIL/uL (ref 3.87–5.11)
RDW: 14 % (ref 11.5–15.5)
RDW: 13.8 % (ref 11.5–15.5)
WBC: 9.3 10*3/uL (ref 4.0–10.5)
WBC: 8.5 10*3/uL (ref 4.0–10.5)

## 2011-12-23 LAB — CREATININE, SERUM
Creatinine, Ser: 0.87 mg/dL (ref 0.50–1.10)
GFR calc Af Amer: 85 mL/min — ABNORMAL LOW (ref >90)
GFR calc non Af Amer: 74 mL/min — ABNORMAL LOW (ref >90)

## 2011-12-23 LAB — BASIC METABOLIC PANEL
BUN: 16 mg/dL (ref 6–23)
CO2: 23 mEq/L (ref 19–32)
Calcium: 10 mg/dL (ref 8.4–10.5)
Chloride: 102 mEq/L (ref 96–112)
Creatinine, Ser: 0.93 mg/dL (ref 0.50–1.10)
GFR calc Af Amer: 79 mL/min — ABNORMAL LOW (ref >90)
GFR calc non Af Amer: 68 mL/min — ABNORMAL LOW (ref >90)
Glucose, Bld: 127 mg/dL — ABNORMAL HIGH (ref 70–99)
Potassium: 4.5 mEq/L (ref 3.5–5.1)
Sodium: 137 mEq/L (ref 135–145)

## 2011-12-23 LAB — DIFFERENTIAL
Basophils Absolute: 0 10*3/uL (ref 0.0–0.1)
Basophils Relative: 0 % (ref 0–1)
Eosinophils Absolute: 0.2 10*3/uL (ref 0.0–0.7)
Eosinophils Relative: 2 % (ref 0–5)
Lymphocytes Relative: 28 % (ref 12–46)
Lymphs Abs: 2.6 10*3/uL (ref 0.7–4.0)
Monocytes Absolute: 0.5 10*3/uL (ref 0.1–1.0)
Monocytes Relative: 6 % (ref 3–12)
Neutro Abs: 6 10*3/uL (ref 1.7–7.7)
Neutrophils Relative %: 64 % (ref 43–77)

## 2011-12-23 LAB — CARDIAC PANEL(CRET KIN+CKTOT+MB+TROPI)
CK, MB: 4.5 ng/mL — ABNORMAL HIGH (ref 0.3–4.0)
Relative Index: 3.4 — ABNORMAL HIGH (ref 0.0–2.5)
Total CK: 133 U/L (ref 7–177)
Troponin I: 0.75 ng/mL (ref <0.30)

## 2011-12-23 LAB — TROPONIN I: Troponin I: <0.3 ng/mL (ref <0.30)

## 2011-12-23 LAB — OCCULT BLOOD X 1 CARD TO LAB, STOOL: Fecal Occult Bld: NEGATIVE

## 2011-12-23 MED ORDER — ADENOSINE 6 MG/2ML IV SOLN
INTRAVENOUS | Status: AC
  Filled 2011-12-23: qty 2

## 2011-12-23 MED ORDER — LABETALOL HCL 100 MG PO TABS
100.0000 mg | ORAL_TABLET | Freq: Two times a day (BID) | ORAL | Status: DC
  Filled 2011-12-23 (×2): qty 1

## 2011-12-23 MED ORDER — ADENOSINE 6 MG/2ML IV SOLN
INTRAVENOUS | Status: AC
  Administered 2011-12-23: 6 mg via INTRAVENOUS
  Filled 2011-12-23: qty 4

## 2011-12-23 MED ORDER — DEXTROSE 5 % IV SOLN
150.0000 mg/h | Freq: Once | INTRAVENOUS | Status: DC
  Filled 2011-12-23: qty 9

## 2011-12-23 MED ORDER — ASPIRIN EC 81 MG PO TBEC
81.0000 mg | DELAYED_RELEASE_TABLET | Freq: Every day | ORAL | Status: DC
  Administered 2011-12-24 – 2011-12-25 (×2): 81 mg via ORAL
  Filled 2011-12-23 (×2): qty 1

## 2011-12-23 MED ORDER — SODIUM CHLORIDE 0.9 % IJ SOLN
3.0000 mL | Freq: Two times a day (BID) | INTRAMUSCULAR | Status: DC
  Administered 2011-12-23 – 2011-12-24 (×2): 3 mL via INTRAVENOUS

## 2011-12-23 MED ORDER — ACETAMINOPHEN 325 MG PO TABS: 650.0000 mg | ORAL_TABLET | ORAL | Status: DC | PRN

## 2011-12-23 MED ORDER — ONDANSETRON HCL 4 MG/2ML IJ SOLN: 4.0000 mg | Freq: Four times a day (QID) | INTRAMUSCULAR | Status: DC | PRN

## 2011-12-23 MED ORDER — AMIODARONE IV BOLUS ONLY 150 MG/100ML
150.0000 mg | Freq: Once | INTRAVENOUS | Status: AC
  Administered 2011-12-23: 150 mg via INTRAVENOUS
  Filled 2011-12-23: qty 100

## 2011-12-23 MED ORDER — ASPIRIN 300 MG RE SUPP
300.0000 mg | RECTAL | Status: AC
  Filled 2011-12-23: qty 1

## 2011-12-23 MED ORDER — SODIUM CHLORIDE 0.9 % IV SOLN: 250.0000 mL | INTRAVENOUS | Status: DC | PRN

## 2011-12-23 MED ORDER — ZOLPIDEM TARTRATE 5 MG PO TABS
5.0000 mg | ORAL_TABLET | Freq: Every evening | ORAL | Status: DC | PRN
  Administered 2011-12-23: 5 mg via ORAL
  Filled 2011-12-23: qty 1

## 2011-12-23 MED ORDER — AMIODARONE HCL IN DEXTROSE 360-4.14 MG/200ML-% IV SOLN
60.0000 mg/h | INTRAVENOUS | Status: DC
  Filled 2011-12-23 (×3): qty 200

## 2011-12-23 MED ORDER — SODIUM CHLORIDE 0.45 % IV SOLN
INTRAVENOUS | Status: DC
  Administered 2011-12-24: 08:00:00 via INTRAVENOUS

## 2011-12-23 MED ORDER — SODIUM CHLORIDE 0.9 % IJ SOLN: 3.0000 mL | INTRAMUSCULAR | Status: DC | PRN

## 2011-12-23 MED ORDER — ALPRAZOLAM 0.25 MG PO TABS: 0.2500 mg | ORAL_TABLET | Freq: Two times a day (BID) | ORAL | Status: DC | PRN

## 2011-12-23 MED ORDER — NITROGLYCERIN 0.4 MG SL SUBL: 0.4000 mg | SUBLINGUAL_TABLET | SUBLINGUAL | Status: DC | PRN

## 2011-12-23 MED ORDER — ASPIRIN 81 MG PO CHEW
324.0000 mg | CHEWABLE_TABLET | ORAL | Status: AC
  Administered 2011-12-23: 324 mg via ORAL
  Filled 2011-12-23: qty 3
  Filled 2011-12-23: qty 1

## 2011-12-23 MED ORDER — PRENATAL MULTIVITAMIN CH
1.0000 | ORAL_TABLET | Freq: Every day | ORAL | Status: DC
  Administered 2011-12-24: 1 via ORAL
  Filled 2011-12-23 (×2): qty 1

## 2011-12-23 MED ORDER — ADENOSINE 12 MG/4ML IV SOLN
12.0000 mg | Freq: Once | INTRAVENOUS | Status: AC
  Administered 2011-12-23: 12 mg via INTRAVENOUS
  Administered 2011-12-23: 6 mg via INTRAVENOUS

## 2011-12-23 MED ORDER — AMIODARONE HCL IN DEXTROSE 360-4.14 MG/200ML-% IV SOLN
60.0000 mg/h | INTRAVENOUS | Status: DC
  Administered 2011-12-23: 60 mg/h via INTRAVENOUS
  Filled 2011-12-23 (×4): qty 200

## 2011-12-23 MED ORDER — ENOXAPARIN SODIUM 40 MG/0.4ML ~~LOC~~ SOLN
40.0000 mg | SUBCUTANEOUS | Status: DC
  Administered 2011-12-23 – 2011-12-24 (×2): 40 mg via SUBCUTANEOUS
  Filled 2011-12-23 (×3): qty 0.4

## 2011-12-23 NOTE — Progress Notes (Signed)
Pt does not want to sign informed consent until husband is able to speak about risks and benefits with MD. Dr. Johney Frame notified and stated that either he will come speak with pt and pt's husband tonight or Dr. Ladona Ridgel will speak with them in the am before the procedure.  Will communicate information to oncoming RN.

## 2011-12-23 NOTE — ED Notes (Signed)
Fast heart rate since 1030 took 2 flecinide and then took labetalol

## 2011-12-23 NOTE — Progress Notes (Signed)
CRITICAL VALUE ALERT  Critical value received:  Troponin 0.75   Date of notification:  12/23/2011  Time of notification:  0557  Critical value read back:yes  Nurse who received alert:  Marylee Floras   MD notified (1st page):  Allred  Time of first page:  0602  MD notified (2nd page):  Time of second page:  Responding MD:  Allred   Time MD responded:  1884

## 2011-12-23 NOTE — Progress Notes (Signed)
Cosign for Gillermina Hu RN med admin, I/O, and notes

## 2011-12-23 NOTE — ED Notes (Signed)
New EKG shown to Cardiologist.

## 2011-12-23 NOTE — ED Notes (Signed)
MD at bedside. 

## 2011-12-23 NOTE — ED Provider Notes (Signed)
History     CSN: [id number]  Arrival date & time 12/23/11  1142   First MD Initiated Contact with Patient 12/23/11 1221      Chief Complaint  Patient presents with  . Chest Pain    (Consider location/radiation/quality/duration/timing/severity/associated sxs/prior treatment) Patient is a 56 y.o. female presenting with chest pain. The history is provided by the patient.  Chest Pain    56 year old female with a history of paroxysmal atrial fibrillation had onset of a rapid heartbeat at 10:30 AM today. There is associated chest pain and dyspnea. Chest pain is rated 5/10. No nausea, vomiting, diaphoresis. She has had similar episodes before. She took flecainide 200 mg which did not give her any relief so she came to the emergency department. In the past, she has required cardioversion for atrial fibrillation. She also relates that she's always had tachycardia since she was a teenager and usually has been able to stop it with physical measures and has never received adenosine. Symptoms are described as severe. Nothing makes it better nothing makes it worse.  Past Medical History  Diagnosis Date  . Postmenopausal   . Tachycardia   . HTN (hypertension)   . Morton's neuroma   . Shoulder pain, left   . Breast pain     right  . Foot pain     bilateral  . Tingling   . Paroxysmal atrial fibrillation     hx in preg rx cv    Past Surgical History  Procedure Date  . Cesarean section 10/10  . Polypectomy 9/09    Uterine  . Wisdom tooth extraction 8/76  . 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 Cataract extraction 2/10  . Cesarean section 07/14/2011    Procedure: CESAREAN SECTION;  Surgeon: Loney Laurence;  Location: [facility];  Service: Gynecology;  Laterality: N/A;  Repeat    Family History  Problem Relation Age of Onset  . Hypertension    . Osteoporosis    . Arthritis      History  Substance Use Topics  . Smoking status: Never Smoker   . Smokeless tobacco:  Not on file  . Alcohol Use: No    OB History    Grav Para Term Preterm Abortions TAB SAB Ect Mult Living   2 2 2  0 0 0 0 0 0 2      Review of Systems  Cardiovascular: Positive for chest pain.  All other systems reviewed and are negative.    Allergies  Review of patient's allergies indicates no known allergies.  Home Medications   Current Outpatient Rx  Name Route Sig Dispense Refill  . FLECAINIDE ACETATE 100 MG PO TABS  Take 2 tablets by mouth for prolonged palpitations 20 tablet 0  . LABETALOL HCL 100 MG PO TABS Oral Take 100 mg by mouth 2 (two) times daily.    Marland Kitchen PRENAPLUS PO Oral Take 1 tablet by mouth daily.       BP 146/122  Pulse 178  Temp(Src) 97.7 F (36.5 C) (Oral)  Resp 12  Ht 5\' 7"  (1.702 m)  Wt 220 lb (99.791 kg)  BMI 34.46 kg/m2  SpO2 99%  Physical Exam  Nursing note and vitals reviewed.  56 year old female who is resting comfortably and in no acute distress. Vital signs are significant for tachycardia with heart rate of 188, and hypertension with blood pressure 146/122. Oxygen saturation is 99% which is normal. Head is normocephalic and atraumatic. PERRLA, EOMI. Neck is nontender  and supple without adenopathy. Back is nontender. Lungs are clear without rales, wheezes, rhonchi. Heart is tachycardic and regular without murmur. Abdomen is soft, flat, nontender without masses or hepatosplenomegaly. Extremities no cyanosis or edema, full range of motion is present. Skin is warm and slightly diaphoretic without rash. Neurologic: Mental status is normal, cranial nerves are intact, there no focal motor or sensory deficits.  ED Course  Procedures (including critical care time)   Labs Reviewed  CBC  DIFFERENTIAL  BASIC METABOLIC PANEL  TROPONIN I   No results found.  Prior to her arrival, her cardiologist, Dr. Tenny Craw, called to inform me of her coming in and requested the in hospital cardiologist be paged. The cardiologist was placed, but it was felt that with  her heart rate and chest pain that she needed treatment emergently. She was given adenosine 6 mg intravenously with no relief. She was given adenosine 12 mg which did convert her to sinus rhythm however she abruptly reverted to SVT although the rate was lower at about 172. After consultation with pharmacy to confirm no adverse interaction, she was started on a bolus of amiodarone. At this point, Dr. Eden Emms arrived and took over her care.  1. SVT (supraventricular tachycardia)     CRITICAL CARE Performed by: QMVHQ,IONGE   Total critical care time: 40 minutes  Critical care time was exclusive of separately billable procedures and treating other patients.  Critical care was necessary to treat or prevent imminent or life-threatening deterioration.  Critical care was time spent personally by me on the following activities: development of treatment plan with patient and/or surrogate as well as nursing, discussions with consultants, evaluation of patient's response to treatment, examination of patient, obtaining history from patient or surrogate, ordering and performing treatments and interventions, ordering and review of laboratory studies, ordering and review of radiographic studies, pulse oximetry and re-evaluation of patient's condition.   MDM  Supraventricular tachycardia with chest pain. She will need chemical conversion and if that fails electrical conversion.        Dione Booze, MD 12/25/11 1421

## 2011-12-23 NOTE — ED Notes (Signed)
Report given to floor. Pt ready to go upstairs.

## 2011-12-23 NOTE — Consult Note (Signed)
CARDIOLOGY CONSULT NOTE   Patient ID: Alison Matthews MRN: [id number] DOB/AGE: 1956-06-29 56 y.o.  Admit date: 12/23/2011  Primary Physician   Lorretta Harp, MD, MD Primary Cardiologist   PR Reason for Consultation   SVT  NWG:[id number] Alison Matthews is a 56 y.o. female with a history of PAF but no CAD. She was in her usual state of health today and took her morning labetalol at about 9 AM. At approximately 10:30, she had sudden onset of tachycardia palpitations. These were associated with shortness of breath, some nausea, and substernal chest pain described as a tightness which reached approximately 4/10. She took flecainide totaling 200 mg at about 10:45 AM. Her symptoms continued so she came to the emergency room. In the emergency room she was given adenosine 6 mg with no change. She was then given adenosine 12 mg with brief conversion to sinus rhythm but she quickly went back into SVT. She was then given a bolus of amiodarone and converted to sinus rhythm. Since the bolus of amiodarone was given, she has had one brief episode of SVT but is otherwise maintaining sinus rhythm. Once she converted to sinus rhythm, her chest pain and shortness of breath resolved.  She has had tachycardia palpitations in the past that resolved with Valsalva. She would get these once every 2 or 3 years since the age of 57. She never required medical therapy for these episodes.   She has had 3 episodes of atrial fibrillation. The first one was in May of 2012 at [redacted] weeks pregnant and she required cardioversion. The second episode happened at [redacted] weeks pregnant and she once again required cardioversion. The third episode happened in January 2000 team. She had palpitations and a near syncopal episode while driving. She was seen in the office and given a prescription for flecainide. She had the prescription filled and took the flecainide as directed. She converted in about 4 hours. This is the first episode of tachycardia  palpitations she has had since the PAF in January. Other than with the tachycardia palpitations and a heart rate greater than 170, she does not get chest pain.  Past Medical History  Diagnosis Date  . Postmenopausal   . Tachycardia   . HTN (hypertension)   . Morton's neuroma   . Shoulder pain, left   . Breast pain     right  . Foot pain     bilateral  . Tingling   . Paroxysmal atrial fibrillation     hx in preg rx cv     02/17/2011 With the pads in the AP position, attempted cardioversion was made was   150 joules synchronized biphasic energy.  The patient had one sinus beat   and then went into a narrow complex tachycardia (SVT, regular).  She was   then cardioverted again with 150 joules synchronized biphasic energy to   sinus rhythm.         Past Surgical History  Procedure Date  . Cesarean section 10/10  . Polypectomy 9/09    Uterine  . Wisdom tooth extraction 8/76  . 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 Cataract extraction 2/10  . Cesarean section 07/14/2011    Procedure: CESAREAN SECTION;  Surgeon: Loney Laurence;  Location: [facility];  Service: Gynecology;  Laterality: N/A;  Repeat    No Known Allergies  Scheduled:   . adenosine (ADENOCARD) IV  12 mg Intravenous Once  . amiodarone  150 mg Intravenous Once  .  DISCONTD: amiodarone (CORDARONE) infusion  150 mg/hr Intravenous Once   Continuous:   . amiodarone (NEXTERONE PREMIX) 360 mg/200 mL dextrose    . amiodarone (NEXTERONE PREMIX) 360 mg/200 mL dextrose      Current Outpatient Prescriptions on File Prior to Encounter  Medication Sig Dispense Refill  . flecainide (TAMBOCOR) 100 MG tablet Take 2 tablets by mouth for prolonged palpitations  20 tablet  0  . Prenatal Vit-Fe Fumarate-FA (PRENAPLUS PO) Take 1 tablet by mouth daily.         History   Social History  . Marital Status: Married    Spouse Name: N/A    Number of Children: N/A  . Years of Education: N/A   Occupational  History  . CPA   Social History Main Topics  . Smoking status: Never Smoker   . Smokeless tobacco: Not on file  . Alcohol Use: No  . Drug Use: No  . Sexually Active: Not on file   Social History Narrative   Married, Has Nannies for children. 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 Post menopausal, childbirth x 2 with donor eggs     Family History - Both parents lived into their 57s, no CAD known in parents, sibs  Problem Relation Age of Onset  . Hypertension    . Osteoporosis    . Arthritis       ROS: Was breast-feeding but planning to stop soon, occasional joint aches/pains. No GI symptoms, no bleeding issues. Both children doing well. Full 14 point review of systems complete and found to be negative unless listed  above  Physical Exam: Blood pressure 146/122, pulse 178, temperature 97.7 F (36.5 C), temperature source Oral, resp. rate 12, height 5\' 7"  (1.702 m), weight 220 lb (99.791 kg), SpO2 99.00%.   General: Well developed, well nourished, female in acute distress with HR >170. Head: Eyes PERRLA, No xanthomas.   Normocephalic and atraumatic, oropharynx without edema or exudate. Dentition good. Lungs: Clear bilaterally to auscultation  Heart: Heart rapid but regular rate and rhythm, S1 & S2 present, no rub/gallop, no murmur. pulses are 2+ & equal all 4 extrem.   Neck: No carotid bruit. No lymphadenopathy.  JVD not elevated. Abdomen: Bowel sounds present, abdomen soft and non-tender without masses or hernias noted. Msk:  No spine or cva tenderness. Normal strength and tone for age, no joint deformities or effusions. Extremities: No clubbing or cyanosis. No edema.  Neuro: Alert and oriented X 3. No focal deficits noted. Psych:  Good affect, responds appropriately Skin: No rashes or lesions noted.  Labs: pending   Echo: Feb 16, 2011  Left ventricle: Wall thickness was increased in a pattern of mild   LVH. Systolic function was normal. The estimated ejection fraction   was in  the range of 55% to 60%.   Aortic valve:  Doppler: There was no stenosis. No regurgitation.   Aorta: Aortic root: The aortic root was normal in size.   Ascending aorta: The ascending aorta was normal in size.   Mitral valve:  Doppler: No significant regurgitation.   Left atrium: The atrium was normal in size.   Right ventricle: The cavity size was normal.   Pulmonic valve:  Doppler: No significant regurgitation.   Tricuspid valve:  Doppler: Trivial regurgitation.   Right atrium: The atrium was normal in size.   Pericardium: There was no pericardial effusion.   Systemic veins:   Inferior vena cava: The vessel was normal in size; the respirophasic   diameter changes  were in the normal range (= 50%); findings are   consistent with normal central venous pressure.  EKG:   On arrival 23-Dec-2011 11:44:43  SVT Nonspecific ST abnormality Vent. rate 191 BPM PR interval * ms QRS duration 70 ms QT/QTc 232/413 ms P-R-T axes * 86 52  After conversion: HR 86 bpm, sinus rhythm ?RVH  ASSESSMENT AND PLAN:   The patient was seen today by Dr Eden Emms, the patient evaluated and the data reviewed.  1. SVT - Ms Clinkscales has converted to SR after a bolus of amiodarone. She had one brief episode of SVT but has otherwise maintained sinus rhythm since the amiodarone. This will be continued, IV for now. Dr. Johney Frame with EP has been asked to evaluate [name]. Long-term management of her SVT will be per EP. We will hold the flecainide and continue her home dose of labetalol.  2. PAF: Mr. is has documented PAF. She'll be continued on her home dose of labetalol.  3. Anticoagulation: For now, we will use DVT Lovenox and add aspirin 81 mg.  4. Chest pain: We will cycle cardiac enzymes and check an echocardiogram. A decision on further evaluation of her chest pain will be made once all data are reviewed.  Signed: Theodore Demark 12/23/2011, 12:27 PM Co-Sign MD  Patient known to me from last preganancy where  I had to perform [facility] while she was pregnant.  Recurrent SVT.  In ER converted with 12 mg of adenosine but recurred.  Converted to NSR with bolus of iv amiodarone.  Continue drip for 24 hours.  Continue labatolol.  Hydrate.  Dr Johney Frame to see for EP.  Advised her to stop breast feeding given meds she has had for this episode of SVT  Charlton Haws 1:25 PM 12/23/2011

## 2011-12-23 NOTE — Consult Note (Addendum)
ELECTROPHYSIOLOGY CONSULT NOTE    Patient ID: ALIE MOUDY MRN: [id number], DOB/AGE: Sep 19, 1956 56 y.o.  Admit date: 12/23/2011 Date of Consult: 12-23-2011  Primary Physician: Lorretta Harp, MD, MD Primary Cardiologist: Dietrich Pates, MD  Reason for Consultation: supraventricular tachycardia  HPI: Mrs. Alison Matthews is a 56 year old woman who has a history of symptomatic SVT who presents today for EP consultation.  She reports abrupt onset/ offset of tachypalpitations since age 59.  Episodes would frequently occur with bending or lifting.  She has been previously able to terminate episodes with vagal maneuvers. She was recently pregnant.  During her pregnancy, she developed atrial fibrillation.  The first episode was in May of 2012 at [redacted] weeks pregnant and she required cardioversion. The second episode happened at [redacted] weeks pregnant and she once again required cardioversion. The third episode happened in January 2013. She had palpitations and a near syncopal episode while driving. She was seen in the office by Dr Ladona Ridgel and given a prescription for flecainide. She had the prescription filled and took the flecainide as directed as a "pill in pocket". She converted in about 4 hours.   Today, around 10:30AM, she developed tachycardia with throat and chest tightness.  She took Flecainide at home without relief.  She called Dr Tenny Craw and was advised to come the the ER for evaluation.  On arrival, she was documented to have a short RP tachcyardia at a rate of 191.  She converted to sinus with 12mg  of adenosine which resulted in brief conversion to SR but with return of SVT.  She was then given a bolus of Amiodarone and converted to sinus rhythm.  In sinus rhythm, her symptoms have resolved.   Presently, she has returned to her usual health state. She is resting comfortably and is without complaint.  Physical Exam: Filed Vitals:   12/23/11 1227 12/23/11 1321 12/23/11 1430 12/23/11 1601  BP: 117/82  119/78 121/76 127/82  Pulse: 78 78 76 77  Temp:      TempSrc:      Resp: 14 16 19 20   Height:    5\' 7"  (1.702 m)  Weight:    227 lb 1.2 oz (103 kg)  SpO2: 98% 99% 95% 97%    GEN- The patient is overweight appearing, alert and oriented x 3 today.   Head- normocephalic, atraumatic Eyes-  Sclera clear, conjunctiva pink Ears- hearing intact Oropharynx- clear Neck- supple, no JVP Lymph- no cervical lymphadenopathy Lungs- Clear to ausculation bilaterally, normal work of breathing Heart- Regular rate and rhythm, no murmurs, rubs or gallops, PMI not laterally displaced GI- soft, NT, ND, + BS Extremities- no clubbing, cyanosis, or edema  Past Medical History  Diagnosis Date  . Postmenopausal   . Tachycardia   . HTN (hypertension)   . Morton's neuroma   . Shoulder pain, left   . Breast pain     right  . Foot pain     bilateral  . Tingling   . Paroxysmal atrial fibrillation     hx in preg rx cv    Surgical History:  Past Surgical History  Procedure Date  . Cesarean section 10/10  . Polypectomy 9/09    Uterine  . Wisdom tooth extraction 8/76  . 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 Cataract extraction 2/10  . Cesarean section 07/14/2011    Procedure: CESAREAN SECTION;  Surgeon: Loney Laurence;  Location: [facility];  Service: Gynecology;  Laterality: N/A;  Repeat  Inpatient Medications:    . amiodarone  150 mg Intravenous Once  . labetalol  100 mg Oral BID  . prenatal multivitamin  1 tablet Oral Daily    Allergies: No Known Allergies  History   Social History  . Marital Status: Married    Spouse Name: N/A    Number of Children: 2  . Years of Education: N/A   Occupational History  . CPA   Social History Main Topics  . Smoking status: Never Smoker   . Smokeless tobacco: Not on file  . Alcohol Use: No  . Drug Use: No  . 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 Hypertension    . Osteoporosis    . Arthritis      Labs:   Lab Results  Component Value Date   WBC 9.3 12/23/2011   HGB 14.8 12/23/2011   HCT 44.8 12/23/2011   MCV 88.7 12/23/2011   PLT 244 12/23/2011    Lab 12/23/11 1223  NA 137  K 4.5  CL 102  CO2 23  BUN 16  CREATININE 0.93  CALCIUM 10.0  PROT --  BILITOT --  ALKPHOS --  ALT --  AST --  GLUCOSE 127*   Lab Results  Component Value Date   TROPONINI <0.30 12/23/2011     Radiology/Studies: No results found.  Echo: Feb 16, 2011  Left ventricle: Wall thickness was increased in a pattern of mild  LVH. Systolic function was normal. The estimated ejection fraction  was in the range of 55% to 60%.  Aortic valve: Doppler: There was no stenosis. No regurgitation.  Aorta: Aortic root: The aortic root was normal in size.  Ascending aorta: The ascending aorta was normal in size.  Mitral valve: Doppler: No significant regurgitation.  Left atrium: The atrium was normal in size.  Right ventricle: The cavity size was normal.  Pulmonic valve: Doppler: No significant regurgitation.  Tricuspid valve: Doppler: Trivial regurgitation.  Right atrium: The atrium was normal in size.  Pericardium: There was no pericardial effusion.  Systemic veins:  Inferior vena cava: The vessel was normal in size; the respirophasic  diameter changes were in the normal range (= 50%); findings are  consistent with normal central venous pressure.  EKG: 12-23-11 at 11:44 demonstrates a narrow complex regular tachycardia with PVC's. EKG earlier demonstrates a short RP tachycardia at 190 bpm  Assessment/Impression:  The patient has recurrent symptomatic short RP supraventricular tachycardia.  She has previously had afib in the setting of pregnancy.  It is possible that her SVT degenerated into afib at that time. Therapeutic strategies for  supraventricular tachycardia including medicine and ablation were discussed in detail with the patient today. Risk, benefits, and alternatives to EP study and radiofrequency ablation were also discussed in detail today. These risks include but are not limited to stroke, bleeding, vascular damage, tamponade, perforation, damage to the heart and other structures, AV block requiring pacemaker, worsening renal function, and death. The patient understands these risk and wishes to proceed. I have discussed with Dr Ladona Ridgel.  Our plan would be to proceed with SVT ablation tomorrow.  We would defer afib ablation for recurrence of afib s/p SVT ablation.  Medical therapy for afib would also be a reasonable option should it recur.  I  will stop amiodarone at this time.   Fayrene Fearing Kennice Finnie,MD 4:57 PM 12/23/2011

## 2011-12-23 NOTE — ED Notes (Addendum)
MD at bedside. Cardiology PA at bedside

## 2011-12-23 NOTE — Progress Notes (Signed)
Pt admitted to 4705-1 from ED via stretcher. Air cabin crew at bedside. Pt denied pain or concerns. VS WNL. PIV to R AC intact with amiodorone drip at 33.3/hr. PIV L hand intact SL. Oriented pt to room and placed call bell in reach. Report received from [place], [place] via phone. Will continue to monitor pt closely.

## 2011-12-23 NOTE — ED Notes (Signed)
Rapid heart rate started at 1030 and at 1045 took 2 100 mg flecanide pills. Pt sts does have chest pain with heart rate. Also SOB with. History of the same and has been cardioverted before.

## 2011-12-23 NOTE — ED Notes (Signed)
MD at bedside. Cardiologist at bedside.  

## 2011-12-24 ENCOUNTER — Other Ambulatory Visit: Payer: Self-pay

## 2011-12-24 ENCOUNTER — Encounter (HOSPITAL_COMMUNITY)
Admission: EM | Disposition: A | Discharge status: Home / Self Care | Payer: Self-pay | Source: Ambulatory Visit | Attending: Cardiovascular Disease

## 2011-12-24 DIAGNOSIS — I369 Nonrheumatic tricuspid valve disorder, unspecified: Secondary | ICD-10-CM

## 2011-12-24 DIAGNOSIS — I471 Supraventricular tachycardia: Secondary | ICD-10-CM

## 2011-12-24 HISTORY — PX: SUPRAVENTRICULAR TACHYCARDIA ABLATION: SHX5492

## 2011-12-24 HISTORY — PX: CARDIAC CATHETERIZATION: SHX172

## 2011-12-24 LAB — CARDIAC PANEL(CRET KIN+CKTOT+MB+TROPI)
CK, MB: 4.6 ng/mL — ABNORMAL HIGH (ref 0.3–4.0)
CK, MB: 3.7 ng/mL (ref 0.3–4.0)
Relative Index: 3.4 — ABNORMAL HIGH (ref 0.0–2.5)
Relative Index: 3.2 — ABNORMAL HIGH (ref 0.0–2.5)
Total CK: 137 U/L (ref 7–177)
Total CK: 117 U/L (ref 7–177)
Troponin I: 0.67 ng/mL (ref <0.30)
Troponin I: 0.43 ng/mL (ref <0.30)

## 2011-12-24 LAB — LIPID PANEL
Cholesterol: 192 mg/dL (ref 0–200)
HDL: 33 mg/dL — ABNORMAL LOW (ref >39)
LDL Cholesterol: 120 mg/dL — ABNORMAL HIGH (ref 0–99)
Total CHOL/HDL Ratio: 5.8 RATIO
Triglycerides: 197 mg/dL — ABNORMAL HIGH (ref <150)
VLDL: 39 mg/dL (ref 0–40)

## 2011-12-24 SURGERY — SUPRAVENTRICULAR TACHYCARDIA ABLATION
Anesthesia: LOCAL

## 2011-12-24 MED ORDER — OXYCODONE HCL 5 MG PO TABS: 5.0000 mg | ORAL_TABLET | ORAL | Status: DC | PRN

## 2011-12-24 MED ORDER — SODIUM CHLORIDE 0.9 % IJ SOLN
3.0000 mL | Freq: Two times a day (BID) | INTRAMUSCULAR | Status: DC
  Administered 2011-12-24: 3 mL via INTRAVENOUS

## 2011-12-24 MED ORDER — SODIUM CHLORIDE 0.9 % IJ SOLN: 3.0000 mL | INTRAMUSCULAR | Status: DC | PRN

## 2011-12-24 MED ORDER — MIDAZOLAM HCL 5 MG/5ML IJ SOLN
INTRAMUSCULAR | Status: AC
  Filled 2011-12-24: qty 5

## 2011-12-24 MED ORDER — FENTANYL CITRATE 0.05 MG/ML IJ SOLN
INTRAMUSCULAR | Status: AC
  Filled 2011-12-24: qty 2

## 2011-12-24 MED ORDER — BUPIVACAINE HCL (PF) 0.25 % IJ SOLN
INTRAMUSCULAR | Status: AC
  Filled 2011-12-24: qty 60

## 2011-12-24 MED ORDER — SODIUM CHLORIDE 0.9 % IV SOLN: 250.0000 mL | INTRAVENOUS | Status: DC | PRN

## 2011-12-24 MED ORDER — ONDANSETRON HCL 4 MG/2ML IJ SOLN: 4.0000 mg | Freq: Four times a day (QID) | INTRAMUSCULAR | Status: DC | PRN

## 2011-12-24 MED ORDER — ACETAMINOPHEN 325 MG PO TABS: 650.0000 mg | ORAL_TABLET | ORAL | Status: DC | PRN

## 2011-12-24 NOTE — Progress Notes (Signed)
Patient ID: Alison Matthews, female   DOB: 1956/07/09, 56 y.o.   MRN: [id number] Subjective:  No additional SVT  Objective:  Vital Signs in the last 24 hours: Temp:  [97.2 F (36.2 C)-98.1 F (36.7 C)] 97.2 F (36.2 C) (03/20 0558) Pulse Rate:  [68-193] 68  (03/20 0558) Resp:  [12-20] 18  (03/20 0558) BP: (93-146)/(48-122) 125/80 mmHg (03/20 0558) SpO2:  [94 %-99 %] 94 % (03/20 0558) Weight:  [99.791 kg (220 lb)-103 kg (227 lb 1.2 oz)] 101.5 kg (223 lb 12.3 oz) (03/20 0558)  Intake/Output from previous day: 03/19 0701 - 03/20 0700 In: 905.4 [P.O.:720; I.V.:185.4] Out: 1601 [Urine:1600; Stool:1] Intake/Output from this shift:    Physical Exam: Well appearing NAD HEENT: Unremarkable Neck:  No JVD, no thyromegally Lymphatics:  No adenopathy Back:  No CVA tenderness Lungs:  Clear HEART:  Regular rate rhythm, no murmurs, no rubs, no clicks Abd:  Flat, positive bowel sounds, no organomegally, no rebound, no guarding Ext:  2 plus pulses, no edema, no cyanosis, no clubbing Skin:  No rashes no nodules Neuro:  CN II through XII intact, motor grossly intact  Lab Results:  Basename 12/23/11 1639 12/23/11 1223  WBC 8.5 9.3  HGB 13.4 14.8  PLT 230 244    Basename 12/23/11 1639 12/23/11 1223  NA -- 137  K -- 4.5  CL -- 102  CO2 -- 23  GLUCOSE -- 127*  BUN -- 16  CREATININE 0.87 0.93    Basename 12/24/11 0542 12/23/11 2158  TROPONINI 0.43* 0.67*   Hepatic Function Panel No results found for this basename: PROT,ALBUMIN,AST,ALT,ALKPHOS,BILITOT,BILIDIR,IBILI in the last 72 hours  Basename 12/24/11 0542  CHOL 192   No results found for this basename: PROTIME in the last 72 hours  Imaging: X-ray Chest Pa And Lateral  12/24/2011  *RADIOLOGY REPORT*  Clinical Data: Chest pain  CHEST - 2 VIEW  Comparison: 10/02/2011; 07/23/2010; 10/09/2006  Findings: Grossly unchanged cardiac silhouette and mediastinal contours.  There is persistent blunting of the bilateral costophrenic  angles, right greater than left, without definite pleural effusion.  No pneumothorax.  No focal airspace opacities. Unchanged bones.  IMPRESSION: No acute cardiopulmonary disease.  Original Report Authenticated By: Waynard Reeds, M.D.    Cardiac Studies: Tele - nsr Assessment/Plan:  1. SVT 2. Atrial fib Rec: I have discussed the treatment options with the patient and the risks/benefits/goals/expectations of EPS/RFA of SVT have been discussed and she wishes to proceed.  LOS: 1 day    Lewayne Bunting 12/24/2011, 11:23 AM

## 2011-12-24 NOTE — H&P (View-Only) (Signed)
Patient ID: Alison Matthews, female   DOB: 04/22/1956, 55 y.o.   MRN: [id number] Subjective:  No additional SVT  Objective:  Vital Signs in the last 24 hours: Temp:  [97.2 F (36.2 C)-98.1 F (36.7 C)] 97.2 F (36.2 C) (03/20 0558) Pulse Rate:  [68-193] 68  (03/20 0558) Resp:  [12-20] 18  (03/20 0558) BP: (93-146)/(48-122) 125/80 mmHg (03/20 0558) SpO2:  [94 %-99 %] 94 % (03/20 0558) Weight:  [99.791 kg (220 lb)-103 kg (227 lb 1.2 oz)] 101.5 kg (223 lb 12.3 oz) (03/20 0558)  Intake/Output from previous day: 03/19 0701 - 03/20 0700 In: 905.4 [P.O.:720; I.V.:185.4] Out: 1601 [Urine:1600; Stool:1] Intake/Output from this shift:    Physical Exam: Well appearing NAD HEENT: Unremarkable Neck:  No JVD, no thyromegally Lymphatics:  No adenopathy Back:  No CVA tenderness Lungs:  Clear HEART:  Regular rate rhythm, no murmurs, no rubs, no clicks Abd:  Flat, positive bowel sounds, no organomegally, no rebound, no guarding Ext:  2 plus pulses, no edema, no cyanosis, no clubbing Skin:  No rashes no nodules Neuro:  CN II through XII intact, motor grossly intact  Lab Results:  Basename 12/23/11 1639 12/23/11 1223  WBC 8.5 9.3  HGB 13.4 14.8  PLT 230 244    Basename 12/23/11 1639 12/23/11 1223  NA -- 137  K -- 4.5  CL -- 102  CO2 -- 23  GLUCOSE -- 127*  BUN -- 16  CREATININE 0.87 0.93    Basename 12/24/11 0542 12/23/11 2158  TROPONINI 0.43* 0.67*   Hepatic Function Panel No results found for this basename: PROT,ALBUMIN,AST,ALT,ALKPHOS,BILITOT,BILIDIR,IBILI in the last 72 hours  Basename 12/24/11 0542  CHOL 192   No results found for this basename: PROTIME in the last 72 hours  Imaging: X-ray Chest Pa And Lateral  12/24/2011  *RADIOLOGY REPORT*  Clinical Data: Chest pain  CHEST - 2 VIEW  Comparison: 10/02/2011; 07/23/2010; 10/09/2006  Findings: Grossly unchanged cardiac silhouette and mediastinal contours.  There is persistent blunting of the bilateral costophrenic  angles, right greater than left, without definite pleural effusion.  No pneumothorax.  No focal airspace opacities. Unchanged bones.  IMPRESSION: No acute cardiopulmonary disease.  Original Report Authenticated By: JOHN A. WATTS V, M.D.    Cardiac Studies: Tele - nsr Assessment/Plan:  1. SVT 2. Atrial fib Rec: I have discussed the treatment options with the patient and the risks/benefits/goals/expectations of EPS/RFA of SVT have been discussed and she wishes to proceed.  LOS: 1 day    Thoms Barthelemy 12/24/2011, 11:23 AM     

## 2011-12-24 NOTE — Progress Notes (Signed)
  Echocardiogram 2D Echocardiogram has been performed.  Dorena Cookey 12/24/2011, 9:47 AM

## 2011-12-24 NOTE — Op Note (Signed)
EPS/RFA AVNRT performed without immediate complication. [id number].

## 2011-12-24 NOTE — Progress Notes (Signed)
INITIAL ADULT NUTRITION ASSESSMENT Date: 12/24/2011   Time: 10:05 AM Reason for Assessment: Nutrition Risk, breastfeeding  ASSESSMENT: Female 56 y.o.  Dx: SupraventricularTachycardia  Hx:  Past Medical History  Diagnosis Date  . Postmenopausal   . Tachycardia   . HTN (hypertension)   . Morton's neuroma   . Shoulder pain, left   . Breast pain     right  . Foot pain     bilateral  . Tingling   . Paroxysmal atrial fibrillation     hx in preg rx cv    Related Meds:     . adenosine (ADENOCARD) IV  12 mg Intravenous Once  . amiodarone  150 mg Intravenous Once  . aspirin  324 mg Oral NOW   Or  . aspirin  300 mg Rectal NOW  . aspirin EC  81 mg Oral Daily  . enoxaparin  40 mg Subcutaneous Q24H  . prenatal multivitamin  1 tablet Oral Daily  . sodium chloride  3 mL Intravenous Q12H  . DISCONTD: amiodarone (CORDARONE) infusion  150 mg/hr Intravenous Once  . DISCONTD: labetalol  100 mg Oral BID     Ht: 5\' 7"  (170.2 cm)  Wt: 223 lb 12.3 oz (101.5 kg) (a scale)  Ideal Wt: 61.3 kg % Ideal Wt: 166%  Usual Wt:  Wt Readings from Last 3 Encounters:  12/24/11 223 lb 12.3 oz (101.5 kg)  12/24/11 223 lb 12.3 oz (101.5 kg)  10/15/11 219 lb (99.338 kg)   Prepregnancy weight ~210 lbs % Usual Wt: 106%  Body mass index is 35.05 kg/(m^2). obesity unspecified   Food/Nutrition Related Hx: Patient currently breastfeeding second child.   Labs:  CMP     Component Value Date/Time   NA 137 12/23/2011 1223   K 4.5 12/23/2011 1223   CL 102 12/23/2011 1223   CO2 23 12/23/2011 1223   GLUCOSE 127* 12/23/2011 1223   BUN 16 12/23/2011 1223   CREATININE 0.87 12/23/2011 1639   CALCIUM 10.0 12/23/2011 1223   PROT 6.4 07/13/2011 1606   ALBUMIN 2.7* 07/13/2011 1606   AST 39* 07/13/2011 1606   ALT 22 07/13/2011 1606   ALKPHOS 108 07/13/2011 1606   BILITOT 0.2* 07/13/2011 1606   GFRNONAA 74* 12/23/2011 1639   GFRAA 85* 12/23/2011 1639      Intake/Output Summary (Last 24 hours) at 12/24/11  1009 Last data filed at 12/24/11 0604  Gross per 24 hour  Intake 905.37 ml  Output   1601 ml  Net -695.63 ml     Diet Order: NPO  Supplements/Tube Feeding: none  IVF:    sodium chloride Last Rate: 20 mL/hr at 12/24/11 0756  DISCONTD: amiodarone (NEXTERONE PREMIX) 360 mg/200 mL dextrose   DISCONTD: amiodarone (NEXTERONE PREMIX) 360 mg/200 mL dextrose Last Rate: 60 mg/hr (12/23/11 1320)    Estimated Nutritional Needs: based on adj body weight of 69.8 kg   Kcal: 2000-2200 Protein: 76-84 gm Fluid: 2.4-2.7 L  S/P cesarean section 07/14/11, breast feeding. Patient was advised to not breast feed due to current medications patient is pumping and dumping. Patient would like to have snacks and a beverage in between meals. Currently NPO waiting for surgery, SVT ablation for sometime today.    NUTRITION DIAGNOSIS: -Increased nutrient needs (NI-5.1).  Status: Ongoing  RELATED TO: increased energy expenditure  AS EVIDENCE BY: breast feeding  MONITORING/EVALUATION(Goals): Goal: PO intake of meals and snacks will meet nutrient requirements Monitor: PO intake, snacks, weight, labs, I/O's  EDUCATION NEEDS: -No education needs identified  at this time  INTERVENTION: 1. RD will add snacks between meals with beverages  Dietitian [phone]  DOCUMENTATION CODES Per approved criteria  -Obesity Unspecified    KOWALSKI, Nickoli Bagheri MARIE 12/24/2011, 10:05 AM

## 2011-12-24 NOTE — Interval H&P Note (Signed)
History and Physical Interval Note:  12/24/2011 12:27 PM  Alison Matthews  has presented today for surgery, with the diagnosis of SVT  The various methods of treatment have been discussed with the patient and family. After consideration of risks, benefits and other options for treatment, the patient has consented to  Procedure(s) (LRB): SUPRAVENTRICULAR TACHYCARDIA ABLATION (N/A) as a surgical intervention .  The patients' history has been reviewed, patient examined, no change in status, stable for surgery.  I have reviewed the patients' chart and labs.  Questions were answered to the patient's satisfaction.     Lewayne Bunting

## 2011-12-25 ENCOUNTER — Encounter (HOSPITAL_COMMUNITY): Payer: Self-pay | Admitting: Cardiology

## 2011-12-25 DIAGNOSIS — I480 Paroxysmal atrial fibrillation: Secondary | ICD-10-CM | POA: Insufficient documentation

## 2011-12-25 DIAGNOSIS — I498 Other specified cardiac arrhythmias: Principal | ICD-10-CM

## 2011-12-25 HISTORY — DX: Paroxysmal atrial fibrillation: I48.0

## 2011-12-25 NOTE — Discharge Summary (Signed)
Discharge Summary   Patient ID: Alison Matthews MRN: [id number], DOB/AGE: 1956/09/19 56 y.o.  Primary MD: Lorretta Harp, MD Primary Cardiologist: Dietrich Pates, MD/Gregg Ladona Ridgel, MD  Admit date: 12/23/2011 D/C date:     12/25/2011      Primary Discharge Diagnoses:  1. 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 Paroxysmal Atrial fibrillation during pregnancy s/p DCCV x 2 2012 2. HTN 3. Morton's neuroma 4. Left shoulder pain 5. Bilateral foot pain 6. Right breast pain 7. Postmenopausal 8. Cesarean section '10, '12 9. Uterine polypectomy '09 10. Pars plana vitrectomy w/ repair of macular hole '09 11. Cataract extraction '10   Allergies No Known Allergies  Diagnostic Studies/Procedures:   12/24/11 - Electrophysiology Study and Radiofrequency catheter ablation of Atrioventricular Node Reentrant Tachycardia CONCLUSION: This study demonstrates successful slow pathway modification and rendering of the patient's AV node reentrant  tachycardia noninducible. It demonstrates very sluggish retrograde only accessory pathway conduction. During ventricular pacing, there was only one occasion where very clearcut accessory pathway conduction could be observed. Following catheter ablation of AV node reentrant tachycardia, there was no inducible SVT.  12/24/11 - 2D Echocardiogram Study Conclusions: - Left ventricle: The cavity size was normal. Systolic function was normal. The estimated ejection fraction was in the range of 55% to 60%. Wall motion was normal; there were no regional wall motion abnormalities. - Left atrium: The atrium was mildly dilated. - Atrial septum: No defect or patent foramen ovale was identified. - Pulmonary arteries: PA peak pressure: 31mm Hg (S).  History of Present Illness: 56yof w/ h/o HTN, PAF during pregnancy, and symptomatic SVT who was evaluated by EP in the [facility] ED on 12/23/11 for  symptomatic SVT despite treatment with felcainide "pill in pocket".   She reported abrupt onset/offset of tachypalpitations since age 61 that would frequently occur with bending or lifting and have previously been successfully terminated with vagal maneuvers. During her pregnancy in May 2012, she developed atrial fibrillation at 17 weeks and 37 weeks, both of which required cardioversion. The third episode happened in January 2013 when she had palpitations and a near syncopal episode while driving. She was seen in the office by Dr Ladona Ridgel at which time she converted 4hrs after flecainide administration and was given a prescription for flecainide "pill in pocket".    Hospital Course: On 12/23/11 she developed tachycardia with throat and chest tightness not relieved with flecainide and was advised to go the the ER for evaluation. On arrival, she was documented to have a short RP tachcyardia at a rate of 191, initially converted to sinus with 12mg  of adenosine but returned to SVT. She was then given a bolus of Amiodarone and converted to sinus rhythm with symptom resolution and admitted for planned SVT ablation.  An echocardiogram was performed on 12/24/11 revealing EF 55-60%, no RWMA, and mildly dilated LA. CXR was without acute cardiopulmonary findings. She had mild elevation in troponins that trended down, no acute ischemic changes on EKG and no anginal symptoms. She underwent radiofrequency ablation of AVNRT on 12/24/11 and tolerated the procedure well without complications.   She was seen and evaluated by Dr. Ladona Ridgel who felt she was stable for discharge home with plans for follow up as scheduled below.  Discharge Vitals: Blood pressure 96/62, pulse 74, temperature 97.9 F (36.6 C), temperature source Oral, resp. rate 17, height 5\' 7"  (1.702 m), weight 221 lb 6.4 oz (100.426 kg), SpO2 95.00%, currently breastfeeding.  Labs: Component Value Date   WBC 8.5 12/23/2011   HGB 13.4 12/23/2011   HCT 40.5  12/23/2011   MCV 88.6 12/23/2011   PLT 230 12/23/2011    Lab 12/23/11 1223  NA 137  K 4.5  CL 102  CO2 23  BUN 16  CREATININE 0.93  CALCIUM 10.0  GLUCOSE 127*   Basename 12/24/11 0542 12/23/11 2158 12/23/11 1639 12/23/11 1223  CKTOTAL 117 137 133 --  CKMB 3.7 4.6* 4.5* --  TROPONINI 0.43* 0.67* 0.75* <0.30   Component Value Date   CHOL 192 12/24/2011   HDL 33* 12/24/2011   LDLCALC 120* 12/24/2011   TRIG 197* 12/24/2011   Discharge Medications   Medication List  As of 12/25/2011 10:04 AM   TAKE these medications         flecainide 100 MG tablet   Commonly known as: TAMBOCOR   Take 2 tablets by mouth for prolonged palpitations      labetalol 100 MG tablet   Commonly known as: NORMODYNE   Take 100 mg by mouth 2 (two) times daily.      PRENAPLUS PO   Take 1 tablet by mouth daily.            Disposition   Discharge Orders    Future Appointments: Provider: Department: Dept Phone: Center:   01/20/2012 10:10 AM Beatrice Lecher, PA [facility] [phone] [facility]     Future Orders Please Complete By Expires   Diet - low sodium heart healthy      Increase activity slowly      Discharge instructions      Comments:   * KEEP INCISION SITES CLEAN AND DRY. Call the office for any signs of bleedings, pus, swelling, increased pain, or any other concerns.  * No driving for 2 days * No strenuous activity for 2 days * No breastfeeding as you received amiodarone in the emergency department       Follow-up Information    Follow up with Tereso Newcomer, PA. Schedule an appointment as soon as possible for a visit on 01/20/2012. (10:10)    Contact information:   [facility] [address] [place] [address] [phone]       Follow up with Lorretta Harp, MD. (As needed)    Contact information:   [address] [place] [address] [phone]           Outstanding Labs/Studies:  None  Duration  of Discharge Encounter: Greater than 30 minutes including physician and PA time.  Signed, Lessa Huge PA-C 12/25/2011, 10:04 AM

## 2011-12-25 NOTE — Op Note (Signed)
Alison Matthews, Alison Matthews              ACCOUNT NO.:  [id number]  MEDICAL RECORD NO.:  [id number]  LOCATION:  4713                         FACILITY:  [facility]  PHYSICIAN:  Doylene Canning. Ladona Ridgel, MD    DATE OF BIRTH:  May 15, 1956  DATE OF PROCEDURE:  12/24/2011 DATE OF DISCHARGE:                              OPERATIVE REPORT   PROCEDURE PERFORMED:  Electrophysiologic study and RF catheter ablation of atrioventricular node reentrant tachycardia.  INTRODUCTION:  The patient is a very pleasant 56 year old woman who has a history of recurrent episodes of atrial fibrillation.  Prior to that, her history dates back to age 72, when she developed palpitations and had documented SVT.  The patient presented to the emergency room yesterday where she was in a narrow QRS tachycardia at 190 beats per minute.  This would terminate with adenosine.  The patient is now referred for catheter ablation.  PROCEDURE:  After informed consent was obtained, the patient was taken to the Diagnostic EP Lab in a fasting state.  After usual preparation and draping, intravenous fentanyl and midazolam were given for sedation. A 6-French Hexapolar catheter was inserted percutaneously into the right jugular vein and advanced to the coronary sinus.  A 6-French quadripolar catheter was inserted percutaneously in the right femoral vein and advanced to the right ventricle.  A 6-French quadripolar catheter was inserted percutaneously in the right femoral vein and advanced to the His bundle region.  After measurement of basic intervals, rapid ventricular pacing was carried out from the right ventricle and stepwise decreased down to 300 msec where VA Wenckebach was observed.  During rapid ventricular pacing, the activation appeared to be somewhat confused with the CS 1,2 and the CS 5,6 being activated simultaneously. Eventually, activation was earliest in the CS56.  There was no inducible SVT with rapid ventricular pacing.  Next,  programmed ventricular stimulation was carried out from the right ventricle at a base drive cycle length of 161 msec.  The S1-S2 interval stepwise decreased down to 220 msec, resulting in ventricular refractoriness.  During programmed ventricular stimulation, the atrial activation sequence was very clearly decremental, but there was some fusion in the activation sequence. Next, programmed atrial stimulation was carried out from the coronary sinus at a base drive cycle length of 096 msec.  The S1-S2 interval was stepwise decreased down to 320 msec, resulting in the induction of SVT. The patient remained in SVT for approximately 30 seconds and then had spontaneous initiation of atrial fibrillation.  This lasted for several minutes before returning to sinus rhythm.  Mapping that was carried out during SVT demonstrated midline atrial activation and a VA interval that was essentially 0.  At this point, a presumptive diagnosis of AV node reentrant tachycardia was made.  It should be noted that the patient's accessory pathway conduction was very poor and very much intermittent. It was very clearly not part of the tachycardia circuit.  At this point, a decision was made despite our inability to map the patient's SVT completely to proceed with slow pathway modification.  A 7-French quadripolar ablation catheter was then inserted into the right femoral vein and advanced into the right atrium.  Koch's triangle was mapped. It  was somewhat larger than usual.  A total of 4 RF energy applications were delivered to sites 8 and 9 in Koch's triangle.  During RF energy application, there was prolonged accelerated junctional rhythm.  There was no block.  At this point, rapid atrial pacing and programmed atrial stimulation were carried out.  There was residual jumps and echo beat noted, but there was no inducible SVT.  Final Bonus RF energy application was delivered and additional rapid atrial pacing  and programmed atrial and ventricular stimulation were carried out. Interestingly, there was no inducible SVT and the PR interval was less than the RR interval, and there was, however, intermittent eccentric atrial activation when ventricular pacing was carried out.  Despite multiple pacing stimuli, there was, however, no inducible SVT.  At this point, the decision was made not to proceed or attempt to ablate the cosmetic-appearing left lateral accessory pathway because it was only clearly visualized on one occasion with eccentric atrial activation.  In addition, there was no inducible arrhythmias.  The catheter was then removed.  Hemostasis was assured.  The patient was returned to her room in satisfactory condition.  COMPLICATIONS:  There were no immediate procedure complications.  RESULTS:  A.  Baseline ECG.  Baseline ECG demonstrates sinus rhythm with normal axis and intervals.  There was no pre-excitation. B.  Baseline intervals.  The sinus node cycle length was 884 msec.  The QRS duration was 95 msec.  The PR interval was 153 msec and the AH interval was 81 msec.  The HV interval was 35 msec.  Following ablation, there was no change in the intervals. C.  Rapid ventricular pacing.  Rapid ventricular pacing was carried out from the right ventricle and stepwise decreased down to 300 msec where VA Wenckebach was observed.  During rapid ventricular pacing, the atrial activation was typically midline and decremental.  At other times, the atrial activation remained decremental, but was eccentric. D.  Programmed ventricular stimulation.  Programmed ventricular stimulation was carried out from the right ventricle at base drive cycle length of 161 msec.  The S1-S2 interval stepwise decreased from 440 msec down to 220 msec where ventricular refractoriness was observed.  During programmed ventricular stimulation, the atrial activation sequence was decremental at times midline and at times  fused. E.  Rapid atrial pacing.  Rapid atrial pacing was carried out from the coronary sinus and stepwise decreased down to 520 msec where AV Wenckebach was demonstrated following catheter ablation. F.  Programmed atrial stimulation.  Programmed atrial stimulation was carried out from the coronary sinus at base drive cycle length of 096 and 500 msec.  The S1-S2 interval stepwise decreased from 440 msec down to 320 msec, resulting initially in the induction of SVT.  Following catheter ablation, programmed atrial stimulation was carried out, demonstrating no inducible SVT, but there were residual jumps and echo beats noted. G.  Arrhythmias observed. 1. AV node reentrant tachycardia initiation was with rapid atrial     pacing.  The duration was nonsustained, termination was     spontaneous. 2. Atrial fibrillation initiation was during SVT.  Duration was     sustained, termination was spontaneous.     a.     Mapping.  Mapping of Koch's triangle demonstrated a much      larger than normal size Koch's triangle.     b.     RF energy application.  A total of 5 RF energy applications      were delivered including 1 bonus  RF energy application.  During RF      energy application, there was a prolonged accelerated junctional      rhythm.  CONCLUSION:  This study demonstrates successful slow pathway modification and rendering of the patient's AV node reentrant tachycardia noninducible.  It demonstrates very sluggish retrograde only accessory pathway conduction.  During ventricular pacing, there was only one occasion where very clearcut accessory pathway conduction could be observed.  Following catheter ablation of AV node reentrant tachycardia, there was no inducible SVT.    Doylene Canning. Ladona Ridgel, MD    GWT/MEDQ  D:  12/24/2011  T:  12/25/2011  Job:  [id number]  cc:   Pricilla Riffle, MD, [facility]

## 2011-12-25 NOTE — Progress Notes (Signed)
Pt discharged home. Discussed discharged instructions with pt including follow up appts, diet and activity restrictions, medications to take at home, and how and when to call the doctor. Pt acknowledged receipt and denied any questions. IV dc'd. Pt denied pain or concerns.

## 2011-12-25 NOTE — Progress Notes (Signed)
Received from cath lab. Rt. Groin level 0. Dressing intact. bedrest till 9pm

## 2011-12-25 NOTE — Progress Notes (Signed)
Patient ID: Alison Matthews, female   DOB: November 22, 1955, 56 y.o.   MRN: [id number] Subjective:  S/p EPS/RFA of AVNRT. No chest pain or sob.  Objective:  Vital Signs in the last 24 hours: Temp:  [97.4 F (36.3 C)-98.4 F (36.9 C)] 97.9 F (36.6 C) (03/21 0551) Pulse Rate:  [74-91] 74  (03/21 0551) Resp:  [17-20] 17  (03/21 0551) BP: (96-127)/(46-84) 96/62 mmHg (03/21 0551) SpO2:  [94 %-97 %] 95 % (03/21 0551) Weight:  [100.426 kg (221 lb 6.4 oz)] 100.426 kg (221 lb 6.4 oz) (03/21 0551)  Intake/Output from previous day: 03/20 0701 - 03/21 0700 In: 120 [P.O.:120] Out: 800 [Urine:800] Intake/Output from this shift:    Physical Exam: Well appearing NAD HEENT: Unremarkable Neck:  No JVD, no thyromegally Lymphatics:  No adenopathy Back:  No CVA tenderness Lungs:  Clear with no wheezes. HEART:  Regular rate rhythm, no murmurs, no rubs, no clicks Abd:  Flat, positive bowel sounds, no organomegally, no rebound, no guarding Ext:  2 plus pulses, no edema, no cyanosis, no clubbing. Right groin with no hematoma. Skin:  No rashes no nodules Neuro:  CN II through XII intact, motor grossly intact  Lab Results:  Basename 12/23/11 1639 12/23/11 1223  WBC 8.5 9.3  HGB 13.4 14.8  PLT 230 244    Basename 12/23/11 1639 12/23/11 1223  NA -- 137  K -- 4.5  CL -- 102  CO2 -- 23  GLUCOSE -- 127*  BUN -- 16  CREATININE 0.87 0.93    Basename 12/24/11 0542 12/23/11 2158  TROPONINI 0.43* 0.67*   Hepatic Function Panel No results found for this basename: PROT,ALBUMIN,AST,ALT,ALKPHOS,BILITOT,BILIDIR,IBILI in the last 72 hours  Basename 12/24/11 0542  CHOL 192   No results found for this basename: PROTIME in the last 72 hours  Imaging: X-ray Chest Pa And Lateral  12/24/2011  *RADIOLOGY REPORT*  Clinical Data: Chest pain  CHEST - 2 VIEW  Comparison: 10/02/2011; 07/23/2010; 10/09/2006  Findings: Grossly unchanged cardiac silhouette and mediastinal contours.  There is persistent blunting  of the bilateral costophrenic angles, right greater than left, without definite pleural effusion.  No pneumothorax.  No focal airspace opacities. Unchanged bones.  IMPRESSION: No acute cardiopulmonary disease.  Original Report Authenticated By: Waynard Reeds, M.D.    Cardiac Studies: Tele - NSR Assessment/Plan:  1. SVT - s/p EPS/RFA of easily inducible AVNRT. Will discharge home with usual activity restriction and followup.   LOS: 2 days    Lewayne Bunting 12/25/2011, 7:49 AM

## 2011-12-29 ENCOUNTER — Other Ambulatory Visit: Payer: Self-pay | Admitting: Internal Medicine

## 2011-12-30 NOTE — Progress Notes (Signed)
Dennis Bast RN notified. Scheduler will call pt for appointment date and time.

## 2012-01-12 ENCOUNTER — Telehealth: Payer: Self-pay | Admitting: Internal Medicine

## 2012-01-12 NOTE — Telephone Encounter (Addendum)
Pt is sch for 02-13-2012 230 pm

## 2012-01-12 NOTE — Telephone Encounter (Signed)
Pt had to cancelled  cpx on 02-02-2012 due to going out of towm. Pt would like mid may cpx appt. Can I sch?

## 2012-01-12 NOTE — Telephone Encounter (Signed)
Ok  but i'm out for a few days in May.  Limit 30 minute slots to no more than 6 per day please. thanks

## 2012-01-12 NOTE — Telephone Encounter (Signed)
Pls advise.  

## 2012-01-15 ENCOUNTER — Other Ambulatory Visit: Payer: Self-pay | Admitting: Dermatology

## 2012-01-20 ENCOUNTER — Encounter: Payer: BC Managed Care – PPO | Admitting: Physician Assistant

## 2012-01-21 ENCOUNTER — Encounter: Payer: Self-pay | Admitting: Internal Medicine

## 2012-01-21 ENCOUNTER — Ambulatory Visit (INDEPENDENT_AMBULATORY_CARE_PROVIDER_SITE_OTHER): Payer: BC Managed Care – PPO | Admitting: Internal Medicine

## 2012-01-21 VITALS — BP 114/72 | HR 67 | Ht 67.0 in | Wt 220.0 lb

## 2012-01-21 DIAGNOSIS — I10 Essential (primary) hypertension: Secondary | ICD-10-CM

## 2012-01-21 DIAGNOSIS — I498 Other specified cardiac arrhythmias: Secondary | ICD-10-CM

## 2012-01-21 DIAGNOSIS — I471 Supraventricular tachycardia: Secondary | ICD-10-CM

## 2012-01-21 NOTE — Assessment & Plan Note (Signed)
Her blood pressure is well controlled. She'll maintain a low-sodium diet. 

## 2012-01-21 NOTE — Progress Notes (Signed)
HPI Alison Matthews returns today for followup. She is a very pleasant middle-age woman with a history of tachycardia palpitations and documented SVT as well as atrial fibrillation. She underwent catheter ablation of her SVT several weeks ago. Since then she has had no recurrent sustained tachycardia palpitations. She feels well. She denies chest pain or shortness of breath. No Known Allergies   Current Outpatient Prescriptions  Medication Sig Dispense Refill  . aspirin 81 MG tablet Take 81 mg by mouth daily.      Marland Kitchen labetalol (NORMODYNE) 200 MG tablet TAKE 1 TABLET BY MOUTH TWICE DAILY  60 tablet  1  . DISCONTD: omeprazole (PRILOSEC) 20 MG capsule Take 20 mg by mouth daily.         Past Medical History  Diagnosis Date  . Postmenopausal   . SVT (supraventricular tachycardia)   . 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 Morton's neuroma   . Shoulder pain, left   . Breast pain     right  . Foot pain     bilateral  . Tingling   . Paroxysmal atrial fibrillation     During pregnancy 2012, s/p DCCV x 2    ROS:   All systems reviewed and negative except as noted in the HPI.   Past Surgical History  Procedure Date  . Cesarean section 10/10  . Polypectomy 9/09    Uterine  . Wisdom tooth extraction 8/76  . 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 Cataract extraction 2/10  . Cesarean section 07/14/2011    Procedure: CESAREAN SECTION;  Surgeon: Loney Laurence;  Location: [facility];  Service: Gynecology;  Laterality: N/A;  Repeat  . Cardiac catheterization 12/24/11  . Macular surgery      Family History  Problem Relation Age of Onset  . Hypertension    . Osteoporosis    . Arthritis       History   Social History  . Marital Status: Married    Spouse Name: N/A    Number of Children: N/A  . Years of Education: N/A   Occupational History  . Not on file.   Social History Main Topics  . Smoking status: Never Smoker   . Smokeless tobacco: Not on  file  . Alcohol Use: No  . Drug Use: No  . Sexually Active: Yes   Other Topics Concern  . 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 HEART:  Regular rate rhythm, no murmurs, no rubs, no clicks Abd:  soft, positive bowel sounds, no organomegally, no rebound, no guarding Ext:  2 plus pulses, no edema, no cyanosis, no clubbing Skin:  No rashes no nodules Neuro:  CN II through XII intact, motor grossly intact  EKG Sinus rhythm with normal axis and interval.  Assess/Plan:

## 2012-01-21 NOTE — Patient Instructions (Signed)
Your physician recommends that you schedule a follow-up appointment as needed  

## 2012-01-21 NOTE — Assessment & Plan Note (Addendum)
Her symptoms have resolved after catheter ablation. We'll plan to followup as needed.

## 2012-01-22 ENCOUNTER — Other Ambulatory Visit (INDEPENDENT_AMBULATORY_CARE_PROVIDER_SITE_OTHER): Payer: BC Managed Care – PPO

## 2012-01-22 DIAGNOSIS — Z0000 Encounter for general adult medical examination without abnormal findings: Secondary | ICD-10-CM

## 2012-01-22 LAB — BASIC METABOLIC PANEL
BUN: 17 mg/dL (ref 6–23)
CO2: 25 mEq/L (ref 19–32)
Calcium: 9.4 mg/dL (ref 8.4–10.5)
Chloride: 107 mEq/L (ref 96–112)
Creatinine, Ser: 0.8 mg/dL (ref 0.4–1.2)
GFR: 76.69 mL/min (ref >60.00)
Glucose, Bld: 92 mg/dL (ref 70–99)
Potassium: 4.6 mEq/L (ref 3.5–5.1)
Sodium: 140 mEq/L (ref 135–145)

## 2012-01-22 LAB — HEPATIC FUNCTION PANEL
ALT: 18 U/L (ref 0–35)
AST: 20 U/L (ref 0–37)
Albumin: 4.2 g/dL (ref 3.5–5.2)
Alkaline Phosphatase: 65 U/L (ref 39–117)
Bilirubin, Direct: 0 mg/dL (ref 0.0–0.3)
Total Bilirubin: 0.5 mg/dL (ref 0.3–1.2)
Total Protein: 7.5 g/dL (ref 6.0–8.3)

## 2012-01-22 LAB — CBC WITH DIFFERENTIAL/PLATELET
Basophils Absolute: 0 10*3/uL (ref 0.0–0.1)
Basophils Relative: 0.7 % (ref 0.0–3.0)
Eosinophils Absolute: 0.2 10*3/uL (ref 0.0–0.7)
Eosinophils Relative: 3.1 % (ref 0.0–5.0)
HCT: 41.9 % (ref 36.0–46.0)
Hemoglobin: 13.6 g/dL (ref 12.0–15.0)
Lymphocytes Relative: 29.8 % (ref 12.0–46.0)
Lymphs Abs: 1.5 10*3/uL (ref 0.7–4.0)
MCHC: 32.6 g/dL (ref 30.0–36.0)
MCV: 90.3 fl (ref 78.0–100.0)
Monocytes Absolute: 0.5 10*3/uL (ref 0.1–1.0)
Monocytes Relative: 10.3 % (ref 3.0–12.0)
Neutro Abs: 2.9 10*3/uL (ref 1.4–7.7)
Neutrophils Relative %: 56.1 % (ref 43.0–77.0)
Platelets: 210 10*3/uL (ref 150.0–400.0)
RBC: 4.64 Mil/uL (ref 3.87–5.11)
RDW: 14 % (ref 11.5–14.6)
WBC: 5.1 10*3/uL (ref 4.5–10.5)

## 2012-01-22 LAB — LIPID PANEL
Cholesterol: 196 mg/dL (ref 0–200)
HDL: 43.3 mg/dL (ref >39.00)
LDL Cholesterol: 136 mg/dL — ABNORMAL HIGH (ref 0–99)
Total CHOL/HDL Ratio: 5
Triglycerides: 85 mg/dL (ref 0.0–149.0)
VLDL: 17 mg/dL (ref 0.0–40.0)

## 2012-01-22 LAB — POCT URINALYSIS DIPSTICK
Bilirubin, UA: NEGATIVE
Blood, UA: NEGATIVE
Glucose, UA: NEGATIVE
Ketones, UA: NEGATIVE
Leukocytes, UA: NEGATIVE
Nitrite, UA: NEGATIVE
Protein, UA: NEGATIVE
Spec Grav, UA: 1.02
Urobilinogen, UA: 0.2
pH, UA: 5.5

## 2012-01-22 LAB — TSH: TSH: 1.84 u[IU]/mL (ref 0.35–5.50)

## 2012-01-29 ENCOUNTER — Other Ambulatory Visit: Payer: Self-pay | Admitting: Obstetrics and Gynecology

## 2012-01-29 DIAGNOSIS — N6459 Other signs and symptoms in breast: Secondary | ICD-10-CM

## 2012-02-02 ENCOUNTER — Encounter: Payer: BC Managed Care – PPO | Admitting: Internal Medicine

## 2012-02-03 ENCOUNTER — Other Ambulatory Visit: Payer: BC Managed Care – PPO

## 2012-02-05 ENCOUNTER — Other Ambulatory Visit: Payer: BC Managed Care – PPO

## 2012-02-12 ENCOUNTER — Ambulatory Visit
Admission: RE | Admit: 2012-02-12 | Discharge: 2012-02-12 | Disposition: A | Discharge status: Home / Self Care | Payer: BC Managed Care – PPO | Source: Ambulatory Visit | Attending: Obstetrics and Gynecology | Admitting: Obstetrics and Gynecology

## 2012-02-12 DIAGNOSIS — N6459 Other signs and symptoms in breast: Secondary | ICD-10-CM

## 2012-02-13 ENCOUNTER — Ambulatory Visit (INDEPENDENT_AMBULATORY_CARE_PROVIDER_SITE_OTHER): Payer: BC Managed Care – PPO | Admitting: Internal Medicine

## 2012-02-13 ENCOUNTER — Encounter: Payer: Self-pay | Admitting: Internal Medicine

## 2012-02-13 VITALS — BP 122/86 | HR 63 | Temp 98.4°F | Ht 66.5 in | Wt 224.0 lb

## 2012-02-13 DIAGNOSIS — Z634 Disappearance and death of family member: Secondary | ICD-10-CM

## 2012-02-13 DIAGNOSIS — I4891 Unspecified atrial fibrillation: Secondary | ICD-10-CM

## 2012-02-13 DIAGNOSIS — Z0000 Encounter for general adult medical examination without abnormal findings: Secondary | ICD-10-CM

## 2012-02-13 DIAGNOSIS — I480 Paroxysmal atrial fibrillation: Secondary | ICD-10-CM

## 2012-02-13 DIAGNOSIS — C44599 Other specified malignant neoplasm of skin of other part of trunk: Secondary | ICD-10-CM

## 2012-02-13 DIAGNOSIS — I471 Supraventricular tachycardia: Secondary | ICD-10-CM

## 2012-02-13 DIAGNOSIS — I10 Essential (primary) hypertension: Secondary | ICD-10-CM

## 2012-02-13 DIAGNOSIS — G5760 Lesion of plantar nerve, unspecified lower limb: Secondary | ICD-10-CM

## 2012-02-13 DIAGNOSIS — E785 Hyperlipidemia, unspecified: Secondary | ICD-10-CM

## 2012-02-13 DIAGNOSIS — I498 Other specified cardiac arrhythmias: Secondary | ICD-10-CM

## 2012-02-13 NOTE — Patient Instructions (Addendum)
Continue  Same med for BP. Monitor  Bp readings a bout 3 x per week and record and bring in readings. And machine to check.  Agree with   heathy weight loss and exercise.     Will monitor  the other problems such as the leg soreness numbness and restless leg. Hopefully that will get better with your weight loss and activity.  Followup in about 2 months . It is possible you we'll be able to go off blood pressure medication if lifestyle intervention is effective.

## 2012-02-13 NOTE — Progress Notes (Signed)
Subjective:    Patient ID: Alison Matthews, female    DOB: 1956/08/26, 56 y.o.   MRN: [id number]  HPI Patient comes in today for Preventive Health Care visit  Since last visit : 7 months  Old baby child  And sib.  Healthy . Has had ablation  In February.  Had tachy and afib.   Seems better for recurrence.  Has nannies at home and father just passed a few weeks ago.  [place].  BP reading  And was 120/95 large cuff.  On labetolol and dr Ladona Ridgel said up to pcp whether to stay on this  Had skin cancer removed back  Dr Danella Deis to go to [facility] for fu.  utd on pap done 2012 and just had US breast and ok. Recently stopped nursing.   Review of Systems ROS:  GEN/ HEENT: No fever, significant weight changes sweats headaches vision problems hearing changes, CV/ PULM; No chest pain shortness of breath cough, syncope,edema  change in exercise tolerance. GI /GU: No adominal pain, vomiting, change in bowel habits. One episode blood in stool prob hemorrhoid . No significant GU symptoms. SKIN/HEME: ,no acute skin rashes suspicious lesions or bleeding. No lymphadenopathy, nodules, masses.  NEURO/ PSYCH:  No neurologic signs such as weakness numbness. No depression anxiety. IMM/ Allergy: No unusual infections.  Allergy .   REST of 12 system review negative except as per HPI pain at base of thumbs at times Past history family history social history reviewed in the electronic medical record.    Objective:   Physical Exam BP 122/86  Pulse 63  Temp(Src) 98.4 F (36.9 C) (Oral)  Wt 224 lb (101.606 kg)  SpO2 95%  Breastfeeding? No Wt Readings from Last 3 Encounters:  02/13/12 224 lb (101.606 kg)  01/21/12 220 lb (99.791 kg)  12/25/11 221 lb 6.4 oz (100.426 kg)  Physical Exam: Vital signs reviewed MVH:QION is a well-developed well-nourished alert cooperative  white female who appears her stated age in no acute distress.  HEENT: normocephalic atraumatic , Eyes: PERRL EOM's full, conjunctiva clear, Nares:  paten,t no deformity discharge or tenderness., Ears: no deformity EAC's clear TMs with normal landmarks. Mouth: clear OP, no lesions, edema.  Moist mucous membranes. Dentition in adequate repair. NECK: supple without masses, thyromegaly or bruits. CHEST/PULM:  Clear to auscultation and percussion breath sounds equal no wheeze , rales or rhonchi. No chest wall deformities or tenderness. Breast: normal by inspection . No dimpling, discharge, masses, tenderness or discharge . CV: PMI is nondisplaced, S1 S2 no gallops, murmurs, rubs. Peripheral pulses are full without delay.No JVD .  ABDOMEN: Bowel sounds normal nontender  No guard or rebound, no hepato splenomegal no CVA tenderness.  No hernia. Extremtities:  No clubbing cyanosis or edema, no acute joint swelling or redness no focal atrophy NEURO:  Oriented x3, cranial nerves 3-12 appear to be intact, no obvious focal weakness,gait within normal limits no abnormal reflexes or asymmetrical  No ulcers on feet nl pulse  SKIN: No acute rashes normal turgor, color, no bruising or petechiae. PSYCH: Oriented, good eye contact, no obvious depression anxiety, cognition and judgment appear normal. LN: no cervical axillary inguinal adenopathy  Lab Results  Component Value Date   WBC 5.1 01/22/2012   HGB 13.6 01/22/2012   HCT 41.9 01/22/2012   PLT 210.0 01/22/2012   GLUCOSE 92 01/22/2012   CHOL 196 01/22/2012   TRIG 85.0 01/22/2012   HDL 43.30 01/22/2012   LDLDIRECT 149.6 01/11/2010   LDLCALC 136* 01/22/2012  ALT 18 01/22/2012   AST 20 01/22/2012   NA 140 01/22/2012   K 4.6 01/22/2012   CL 107 01/22/2012   CREATININE 0.8 01/22/2012   BUN 17 01/22/2012   CO2 25 01/22/2012   TSH 1.84 01/22/2012   INR 0.9 04/08/2008   HGBA1C 5.7 01/15/2009        Assessment & Plan:  Preventive Health Care Counseled regarding healthy nutrition, exercise, sleep, injury prevention, calcium vit d and healthy weight . Disc ca vit d and HO  S/P tachycardia  Af sp ablation so far doing  well  HT and above  On labetolol now for bp.  fam hx  Pt to intensify lsi and weight loss record readings  And bring in readings and cuff. Consider change bp meds if needed. Wean  Then.  Bereavement   Father  rls leg soreness ; Numb feeling feet with prev eval  No dx  Hx of mortons neuroma Skin cancer back under care

## 2012-02-14 ENCOUNTER — Encounter: Payer: Self-pay | Admitting: Internal Medicine

## 2012-02-14 DIAGNOSIS — C44599 Other specified malignant neoplasm of skin of other part of trunk: Secondary | ICD-10-CM | POA: Insufficient documentation

## 2012-02-14 DIAGNOSIS — C44509 Unspecified malignant neoplasm of skin of other part of trunk: Secondary | ICD-10-CM

## 2012-02-14 HISTORY — DX: Other specified malignant neoplasm of skin of other part of trunk: C44.599

## 2012-02-14 HISTORY — DX: Unspecified malignant neoplasm of skin of other part of trunk: C44.509

## 2012-02-23 ENCOUNTER — Other Ambulatory Visit: Payer: Self-pay | Admitting: Internal Medicine

## 2012-02-25 ENCOUNTER — Other Ambulatory Visit: Payer: Self-pay

## 2012-02-25 MED ORDER — LABETALOL HCL 200 MG PO TABS: 200.0000 mg | ORAL_TABLET | Freq: Two times a day (BID) | ORAL | Status: DC

## 2012-02-25 NOTE — Telephone Encounter (Signed)
Rx sent to [facility] for labetalol

## 2012-03-11 ENCOUNTER — Other Ambulatory Visit: Payer: Self-pay | Admitting: Dermatology

## 2012-03-15 ENCOUNTER — Telehealth: Payer: Self-pay | Admitting: Family Medicine

## 2012-03-15 NOTE — Telephone Encounter (Signed)
Pt injured both ankles yesterday. Cannot walk well, and husband is out of town. Would like xrays first, because she is not very mobile, and only has a babysitter for today - she is trying to limit her trips. Or, should she go see Orthopaedics? Please advise and call pt back ASAP. Thanks!

## 2012-03-15 NOTE — Telephone Encounter (Signed)
Per Dr. Fabian Sharp call [facility] to see if pt can be seen for an appt.  Called and spoke to [facility] and pt has an appt at 3:30.  She was instructed to arrive at 3 for paperwork.

## 2012-04-20 ENCOUNTER — Ambulatory Visit: Payer: BC Managed Care – PPO | Admitting: Internal Medicine

## 2012-04-20 DIAGNOSIS — Z0289 Encounter for other administrative examinations: Secondary | ICD-10-CM

## 2012-04-27 ENCOUNTER — Other Ambulatory Visit: Payer: Self-pay | Admitting: Internal Medicine

## 2012-06-30 ENCOUNTER — Other Ambulatory Visit: Payer: Self-pay | Admitting: Internal Medicine

## 2012-06-30 DIAGNOSIS — Z1231 Encounter for screening mammogram for malignant neoplasm of breast: Secondary | ICD-10-CM

## 2012-07-29 ENCOUNTER — Encounter: Payer: Self-pay | Admitting: Internal Medicine

## 2012-08-05 ENCOUNTER — Ambulatory Visit
Admission: RE | Admit: 2012-08-05 | Discharge: 2012-08-05 | Disposition: A | Discharge status: Home / Self Care | Payer: BC Managed Care – PPO | Source: Ambulatory Visit | Attending: Internal Medicine | Admitting: Internal Medicine

## 2012-08-05 DIAGNOSIS — Z1231 Encounter for screening mammogram for malignant neoplasm of breast: Secondary | ICD-10-CM

## 2012-10-06 DIAGNOSIS — Z9289 Personal history of other medical treatment: Secondary | ICD-10-CM

## 2012-10-06 HISTORY — DX: Personal history of other medical treatment: Z92.89

## 2012-11-09 ENCOUNTER — Other Ambulatory Visit: Payer: Self-pay | Admitting: Internal Medicine

## 2012-11-11 ENCOUNTER — Other Ambulatory Visit: Payer: Self-pay | Admitting: Dermatology

## 2012-11-11 NOTE — Telephone Encounter (Signed)
Pt called following up on labetalol (NORMODYNE) 200 MG tablet script  request.  Pt is going out of town tomorrow early am and would like today if possible.  Pt would like 90 day supply.

## 2012-11-24 LAB — PULMONARY FUNCTION TEST

## 2012-11-30 ENCOUNTER — Telehealth: Payer: Self-pay | Admitting: Internal Medicine

## 2012-11-30 NOTE — Telephone Encounter (Signed)
Pt went to [facility] in [place], for an executive physical. Pt would like to go over results w/ you. Pt found out she has Hypo thyroid, put on  Levothyroxine 25 mg. Pt found out High Cholesterol put on Crestor 20mg . Pt needs prior authorization for this.  Clinic gave her scripts for both, but she could not get Crestor filled. Pt wants to know if you can prescribe Crestor so she could get the prior authorization going. Pt will bring lab results to you.  Pls advise.

## 2012-11-30 NOTE — Telephone Encounter (Signed)
This patient will need to come in and talk to Dr. Fabian Sharp about medications.  Please have her bring a copy of all records.  Call and make appt with the patient.

## 2012-12-01 NOTE — Telephone Encounter (Signed)
Appt made

## 2012-12-15 ENCOUNTER — Ambulatory Visit: Payer: BC Managed Care – PPO | Admitting: Internal Medicine

## 2013-01-05 ENCOUNTER — Encounter: Payer: Self-pay | Admitting: Internal Medicine

## 2013-01-05 ENCOUNTER — Ambulatory Visit (INDEPENDENT_AMBULATORY_CARE_PROVIDER_SITE_OTHER): Payer: BC Managed Care – PPO | Admitting: Internal Medicine

## 2013-01-05 VITALS — BP 120/76 | HR 61 | Temp 99.1°F | Wt 226.0 lb

## 2013-01-05 DIAGNOSIS — R931 Abnormal findings on diagnostic imaging of heart and coronary circulation: Secondary | ICD-10-CM

## 2013-01-05 DIAGNOSIS — I4891 Unspecified atrial fibrillation: Secondary | ICD-10-CM

## 2013-01-05 DIAGNOSIS — R9389 Abnormal findings on diagnostic imaging of other specified body structures: Secondary | ICD-10-CM

## 2013-01-05 DIAGNOSIS — E039 Hypothyroidism, unspecified: Secondary | ICD-10-CM

## 2013-01-05 DIAGNOSIS — K219 Gastro-esophageal reflux disease without esophagitis: Secondary | ICD-10-CM

## 2013-01-05 DIAGNOSIS — I471 Supraventricular tachycardia: Secondary | ICD-10-CM

## 2013-01-05 DIAGNOSIS — I10 Essential (primary) hypertension: Secondary | ICD-10-CM

## 2013-01-05 DIAGNOSIS — E785 Hyperlipidemia, unspecified: Secondary | ICD-10-CM

## 2013-01-05 DIAGNOSIS — Z634 Disappearance and death of family member: Secondary | ICD-10-CM

## 2013-01-05 DIAGNOSIS — Z8679 Personal history of other diseases of the circulatory system: Secondary | ICD-10-CM

## 2013-01-05 DIAGNOSIS — I498 Other specified cardiac arrhythmias: Secondary | ICD-10-CM

## 2013-01-05 MED ORDER — LABETALOL HCL 200 MG PO TABS: ORAL_TABLET | ORAL | Status: DC

## 2013-01-05 NOTE — Progress Notes (Signed)
Chief Complaint  Patient presents with  . Follow-up    From [facility] in [place].    HPI: Patient comes in today for follow up of full evaluation at [facility] clinic Had comprehensive labs  [facility] and [facility]v appt.  Her visit there was initiated when she noted  Poor exercise tolerance when went to [employer] and was very tired after one ski run.   In addition she is grieving the death of her father last year.  Wanted more information about evaluation and path to take   Was noted to have elevated tsh in 6.4  range and placed on low dose levothyr replacement with plan to titrated  slowly because of hx of PAF.  No se since starting . Requested tsh , free t4  LIPID:s elevated:  TC 243 ldl 174 hdl 47 ratio 5.2 ETT normal but very poor exercise tolerance noted . CAC was elevated 137 90 %ile risk and placed on crestor 20 mg per day. And asa 81  NO se noted at this time. NO evidence of diabetes or inflammatory markers being elevated. Ht  Advised stay on labetolol until euthyroid to avoid arrythmia risk.  Vit d 26 but nl dexa  To take otc vit d 2000  Obesity  Plan for weight loss  Health directed Goal 180  Personal goal is lower she stated   Other eval: nl pfts dexa low omega 3 and told to take supplement and change diet.    ROS:  GEN/ HEENT: No fever, significant weight changes sweats headaches vision problems hearing changes, CV/ PULM; No chest pain shortness of breath cough, syncope,edema  change in exercise tolerance. Except as above  GI /GU: No adominal pain, vomiting, change in bowel habits. No blood in the stool. No significant GU symptoms. SKIN/HEME: ,no acute skin rashes suspicious lesions or bleeding. No lymphadenopathy, nodules, masses.  NEURO/ PSYCH:  No neurologic signs such as weakness numbness.  Doesn't feel hopeless  But grieving  And sad at times IMM/ Allergy: No unusual infections.  Allergy .   REST of 12 system review negative  except as per HPI   Past Medical History  Diagnosis Date  . Postmenopausal   . SVT (supraventricular tachycardia)   . 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 Morton's neuroma   . Shoulder pain, left   . Breast pain     right  . Foot pain     bilateral  . Tingling   . Paroxysmal atrial fibrillation     During pregnancy 2012, s/p DCCV x 2  . Skin cancer of trunk 02/14/2012  . History of PFTs 2014    nl  . H/O bone density study 2014    nl   . History of ETT     nl low exrcise capacity    Family History  Problem Relation Age of Onset  . Hypertension    . Osteoporosis    . Arthritis      History   Social History  . Marital Status: Married    Spouse Name: N/A    Number of Children: N/A  . Years of Education: N/A   Social History Main Topics  . Smoking status: Never Smoker   . Smokeless tobacco: None  . Alcohol Use: No  . Drug Use: No  . Sexually Active: Yes   Other Topics Concern  . 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 aspirin 81 MG tablet Take 81 mg by mouth daily.      Marland Kitchen labetalol (NORMODYNE) 200 MG tablet TAKE 1 TABLET BY MOUTH TWICE DAILY  180 tablet  3  . levothyroxine (SYNTHROID, LEVOTHROID) 25 MCG tablet Take 25 mcg by mouth daily before breakfast.      . omeprazole (PRILOSEC) 20 MG capsule Take 20 mg by mouth daily. OTC      . rosuvastatin (CRESTOR) 20 MG tablet Take 20 mg by mouth daily.      . [DISCONTINUED] labetalol (NORMODYNE) 200 MG tablet TAKE 1 TABLET BY MOUTH TWICE DAILY  180 tablet  0   No facility-administered encounter medications on file as of 01/05/2013.    EXAM:  BP 120/76  Pulse 61  Temp(Src) 99.1 F (37.3 C)  (Oral)  Wt 226 lb (102.513 kg)  BMI 35.94 kg/m2  SpO2 97%  Breastfeeding? No  Body mass index is 35.94 kg/(m^2).  Physical Exam: Vital signs reviewed AOZ:HYQM is a well-developed well-nourished alert cooperative   female who appears her stated age in no acute distress.  PSYCH: Oriented, good eye contact, no obvious depression anxiety, cognition and judgment appear normal. Reviewed  Data    Lab Results  Component Value Date   WBC 5.1 01/22/2012   HGB 13.6 01/22/2012   HCT 41.9 01/22/2012   PLT 210.0 01/22/2012   GLUCOSE 92 01/22/2012   CHOL 129 01/05/2013   TRIG 114.0 01/05/2013   HDL 37.70* 01/05/2013   LDLDIRECT 149.6 01/11/2010   LDLCALC 69 01/05/2013   ALT 18 01/22/2012   AST 20 01/22/2012   NA 140 01/22/2012   K 4.6 01/22/2012   CL 107 01/22/2012   CREATININE 0.8 01/22/2012   BUN 17 01/22/2012   CO2 25 01/22/2012   TSH 1.89 01/05/2013   INR 0.9 04/08/2008   HGBA1C 5.7 01/15/2009    ASSESSMENT AND PLAN:  Discussed the following assessment and plan:  Hypothyroidism - new dx sublinical hypo slow replacement about 6 weeks out check today - Plan: omeprazole (PRILOSEC) 20 MG capsule, levothyroxine (SYNTHROID, LEVOTHROID) 25 MCG tablet, rosuvastatin (CRESTOR) 20 MG tablet, TSH, T4, free, Lipid panel  HYPERTENSION - currently on labetolol lsi consider change med in future as app poss acei - Plan: omeprazole (PRILOSEC) 20 MG capsule, levothyroxine (SYNTHROID, LEVOTHROID) 25 MCG tablet, rosuvastatin (CRESTOR) 20 MG tablet, TSH, T4, free, Lipid panel  Dyslipidemia - with elevated cac  newly on crestor checked lipids although a bit early to check - Plan: omeprazole (PRILOSEC) 20 MG capsule, levothyroxine (SYNTHROID, LEVOTHROID) 25 MCG tablet, rosuvastatin (CRESTOR) 20 MG tablet, TSH, T4, free, Lipid panel  SVT (supraventricular tachycardia)  Atrial fibrillation  Hx of atrial fibrillation   GERD (gastroesophageal reflux disease) - with hh on meds should improve with weight loss  [place] CAC  score, >100 - 199 - 90 %ile  2014 neg ett poor exercise tolerance  Bereavement - father 16-Feb-2012   Patient Care Team: Madelin Headings, MD as PCP - General Marinus Maw, MD (Cardiology) Loney Laurence, MD as Attending Physician (Obstetrics and Gynecology) [facility],  MD as Attending Physician (Dermatology) Knute Neu. Irene Limbo, MD as Attending Physician (Dermatology) Lacretia Nicks. Lisa Roca M D  Patient Instructions  Will notify you  of labs when available.  And  Send to the [facility].   Agree with monitored     lifestyle intervention healthy eating and exercise .    Tracking activity sleep and  Intake is important.        Neta Mends. Lumir Demetriou M.D.  Post visit review of more data  Input  Advised  3 months tsh free t4 lipid fasting lfts and cpk  She has no sx at this time but can arrange this to be done  At the appropriate time.  Lab Results  Component Value Date   TSH 1.89 01/05/2013   Lab Results  Component Value Date   CHOL 129 01/05/2013   HDL 37.70* 01/05/2013   LDLCALC 69 01/05/2013   LDLDIRECT 149.6 01/11/2010   TRIG 114.0 01/05/2013   CHOLHDL 3 01/05/2013    Will send copy of labs to Dr Waynard Edwards

## 2013-01-05 NOTE — Patient Instructions (Addendum)
Will notify you  of labs when available.  And  Send to the [facility].   Agree with monitored     lifestyle intervention healthy eating and exercise .    Tracking activity sleep and  Intake is important.

## 2013-01-06 LAB — T4, FREE: Free T4: 0.88 ng/dL (ref 0.60–1.60)

## 2013-01-06 LAB — LIPID PANEL
Cholesterol: 129 mg/dL (ref 0–200)
HDL: 37.7 mg/dL — ABNORMAL LOW (ref >39.00)
LDL Cholesterol: 69 mg/dL (ref 0–99)
Total CHOL/HDL Ratio: 3
Triglycerides: 114 mg/dL (ref 0.0–149.0)
VLDL: 22.8 mg/dL (ref 0.0–40.0)

## 2013-01-06 LAB — TSH: TSH: 1.89 u[IU]/mL (ref 0.35–5.50)

## 2013-01-08 ENCOUNTER — Encounter: Payer: Self-pay | Admitting: Internal Medicine

## 2013-01-08 DIAGNOSIS — Z634 Disappearance and death of family member: Secondary | ICD-10-CM | POA: Insufficient documentation

## 2013-01-08 DIAGNOSIS — Z8679 Personal history of other diseases of the circulatory system: Secondary | ICD-10-CM | POA: Insufficient documentation

## 2013-01-08 DIAGNOSIS — R931 Abnormal findings on diagnostic imaging of heart and coronary circulation: Secondary | ICD-10-CM | POA: Insufficient documentation

## 2013-01-08 DIAGNOSIS — E039 Hypothyroidism, unspecified: Secondary | ICD-10-CM | POA: Insufficient documentation

## 2013-01-08 DIAGNOSIS — K219 Gastro-esophageal reflux disease without esophagitis: Secondary | ICD-10-CM | POA: Insufficient documentation

## 2013-01-19 ENCOUNTER — Other Ambulatory Visit: Payer: Self-pay | Admitting: Family Medicine

## 2013-01-19 DIAGNOSIS — E785 Hyperlipidemia, unspecified: Secondary | ICD-10-CM

## 2013-01-19 DIAGNOSIS — E039 Hypothyroidism, unspecified: Secondary | ICD-10-CM

## 2013-02-01 ENCOUNTER — Other Ambulatory Visit: Payer: Self-pay | Admitting: Obstetrics and Gynecology

## 2013-02-17 ENCOUNTER — Other Ambulatory Visit: Payer: Self-pay | Admitting: Internal Medicine

## 2013-04-18 ENCOUNTER — Other Ambulatory Visit: Payer: BC Managed Care – PPO

## 2013-04-18 ENCOUNTER — Telehealth: Payer: Self-pay | Admitting: Family Medicine

## 2013-04-18 NOTE — Telephone Encounter (Signed)
Please refer for colonoscopy screening  Colon cancer

## 2013-04-18 NOTE — Telephone Encounter (Signed)
Pt needs a referral for Colonoscopy. Pt tried to schedule appointment herself but insurance denied without a referral. Pt stated that she did discuss this with you at her last visit.

## 2013-04-19 ENCOUNTER — Other Ambulatory Visit: Payer: Self-pay | Admitting: Family Medicine

## 2013-04-19 DIAGNOSIS — Z1211 Encounter for screening for malignant neoplasm of colon: Secondary | ICD-10-CM

## 2013-04-19 NOTE — Telephone Encounter (Signed)
Order placed in the system. 

## 2013-04-20 ENCOUNTER — Encounter: Payer: Self-pay | Admitting: Gastroenterology

## 2013-04-22 ENCOUNTER — Other Ambulatory Visit (INDEPENDENT_AMBULATORY_CARE_PROVIDER_SITE_OTHER): Payer: BC Managed Care – PPO

## 2013-04-22 DIAGNOSIS — E785 Hyperlipidemia, unspecified: Secondary | ICD-10-CM

## 2013-04-22 DIAGNOSIS — E039 Hypothyroidism, unspecified: Secondary | ICD-10-CM

## 2013-04-22 LAB — HEPATIC FUNCTION PANEL
ALT: 22 U/L (ref 0–35)
AST: 27 U/L (ref 0–37)
Albumin: 3.9 g/dL (ref 3.5–5.2)
Alkaline Phosphatase: 70 U/L (ref 39–117)
Bilirubin, Direct: 0.1 mg/dL (ref 0.0–0.3)
Total Bilirubin: 0.5 mg/dL (ref 0.3–1.2)
Total Protein: 6.9 g/dL (ref 6.0–8.3)

## 2013-04-22 LAB — LIPID PANEL
Cholesterol: 124 mg/dL (ref 0–200)
HDL: 40.7 mg/dL (ref >39.00)
LDL Cholesterol: 60 mg/dL (ref 0–99)
Total CHOL/HDL Ratio: 3
Triglycerides: 119 mg/dL (ref 0.0–149.0)
VLDL: 23.8 mg/dL (ref 0.0–40.0)

## 2013-04-22 LAB — TSH: TSH: 1.56 u[IU]/mL (ref 0.35–5.50)

## 2013-04-22 LAB — T4, FREE: Free T4: 0.72 ng/dL (ref 0.60–1.60)

## 2013-04-22 LAB — CK: Total CK: 189 U/L — ABNORMAL HIGH (ref 7–177)

## 2013-05-27 ENCOUNTER — Other Ambulatory Visit: Payer: Self-pay | Admitting: Internal Medicine

## 2013-05-28 IMAGING — US US OB FOLLOW-UP
1 series · 14 of 28 positions shown · non-contrast
Comparison: none

[Series 1: us ob follow-up · 14 of 36 slices shown]
[im 2/36]
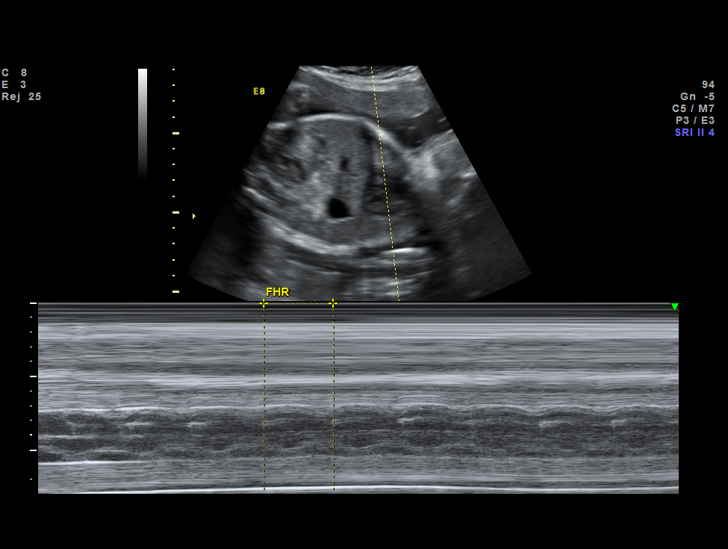
[im 4/36]
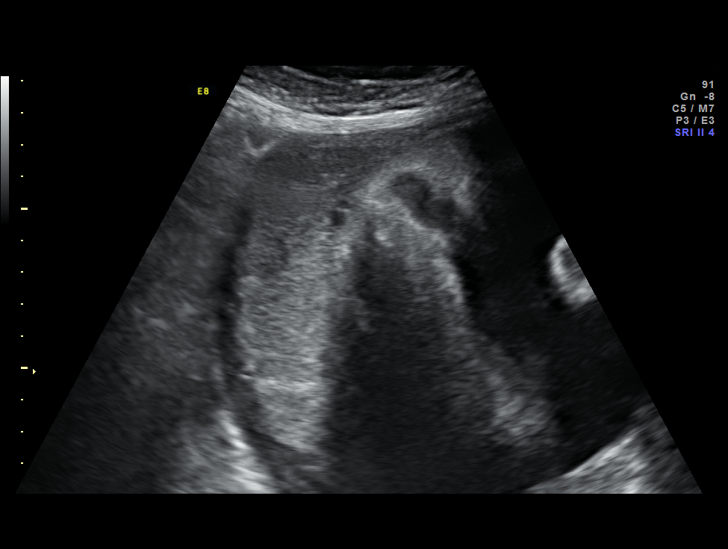
[im 7/36]
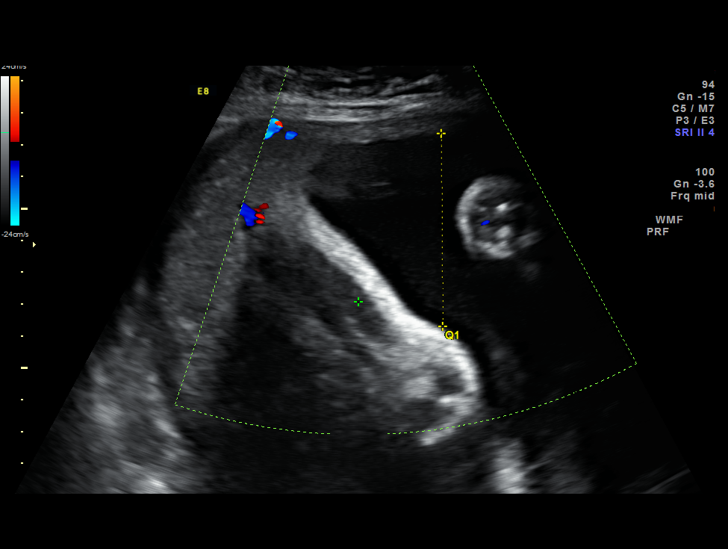
[im 10/36]
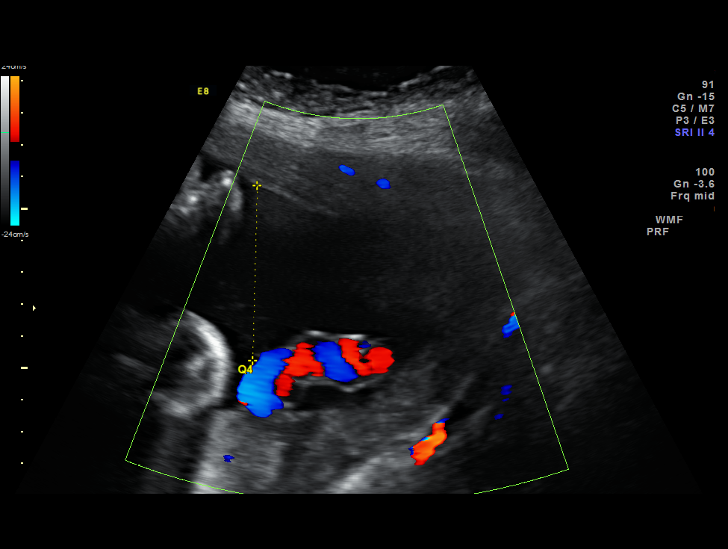
[im 12/36]
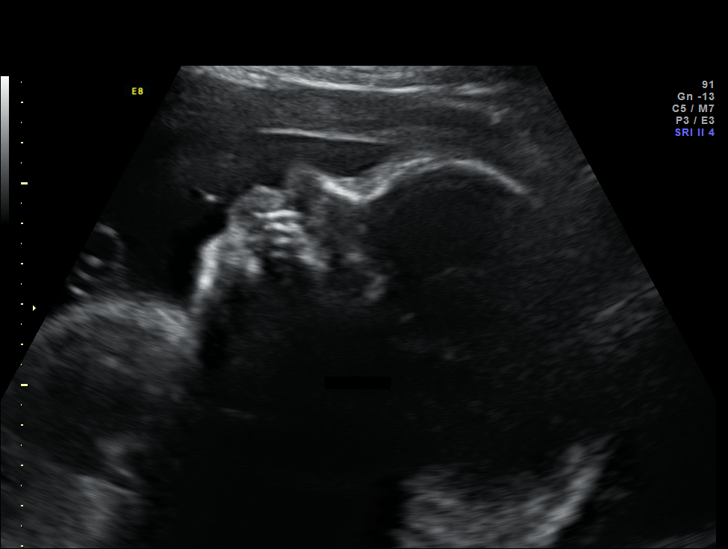
[im 15/36]
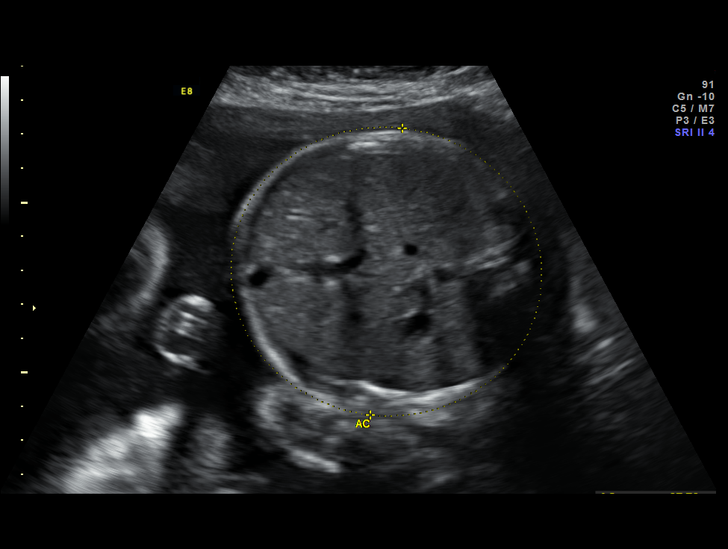
[im 17/36]
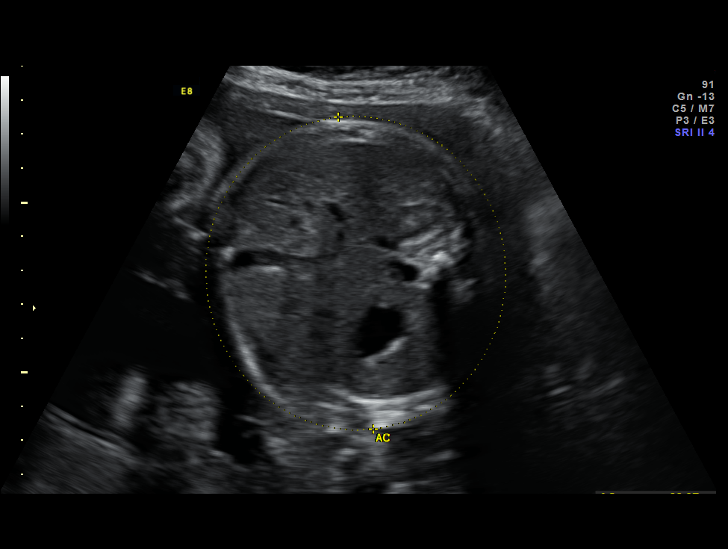
[im 20/36]
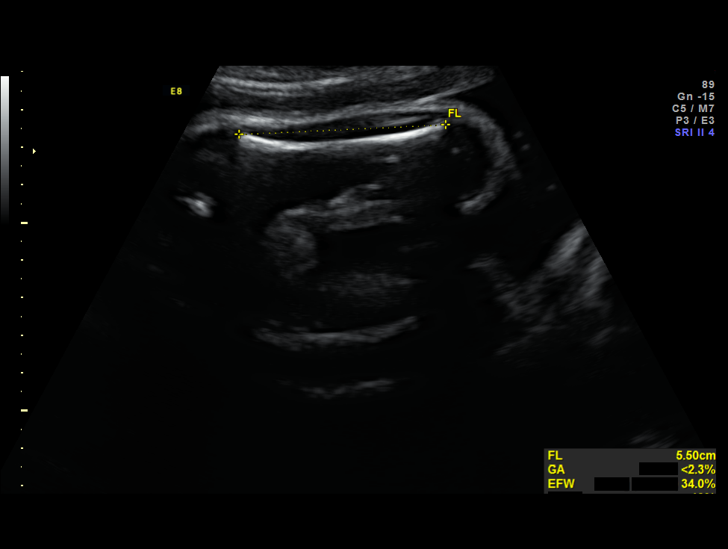
[im 23/36]
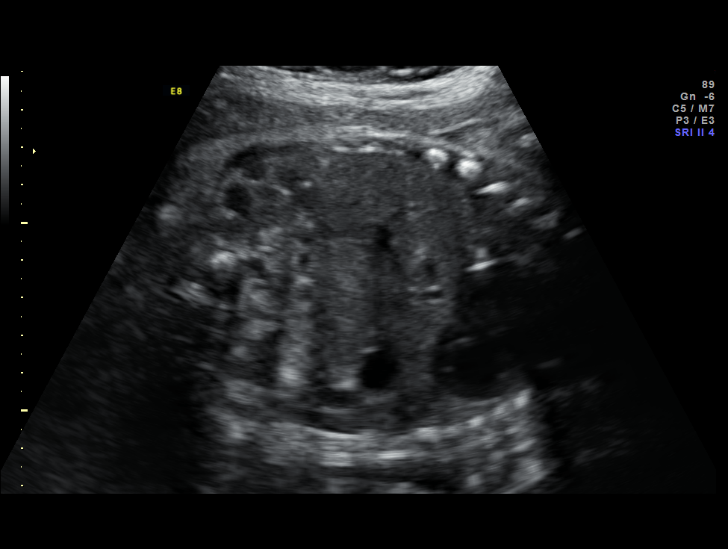
[im 25/36]
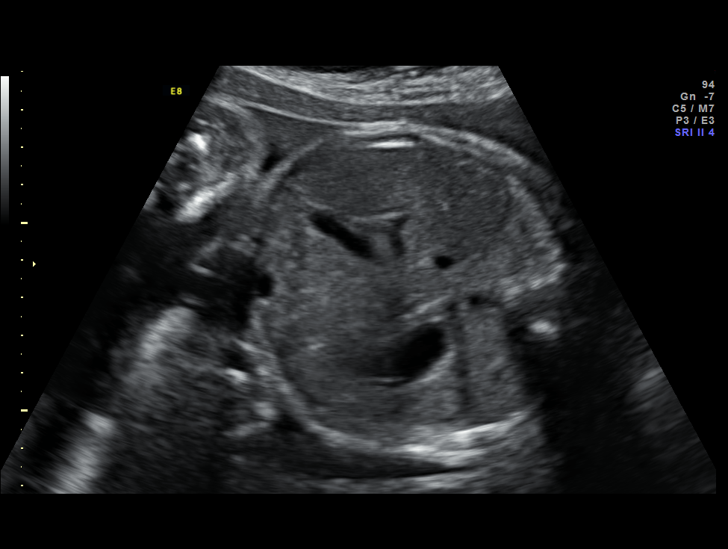
[im 28/36]
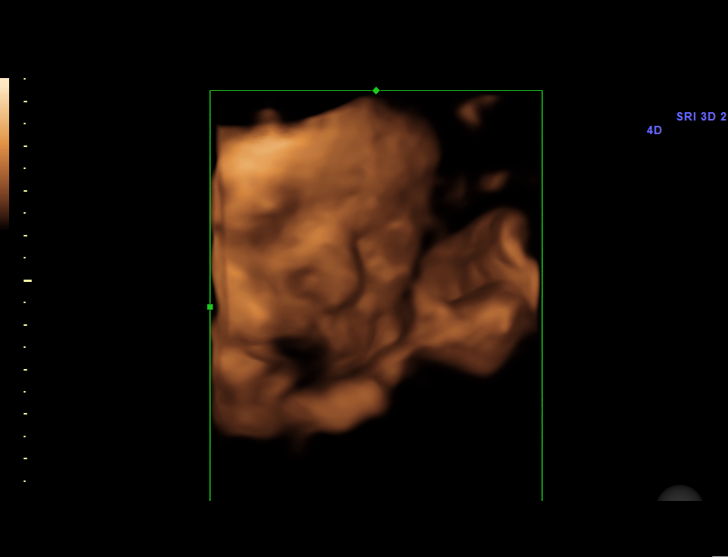
[im 30/36]
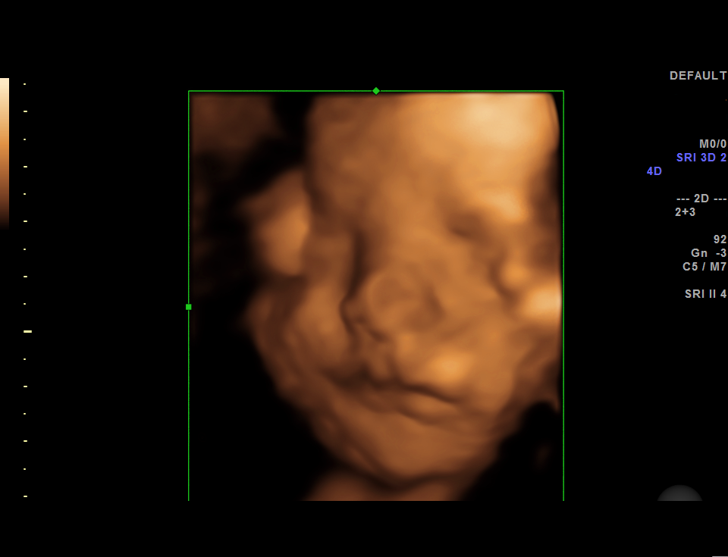
[im 33/36]
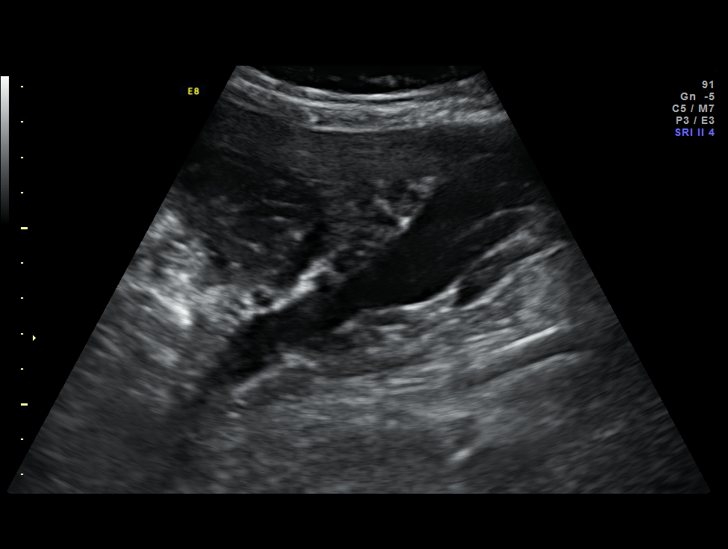
[im 36/36]
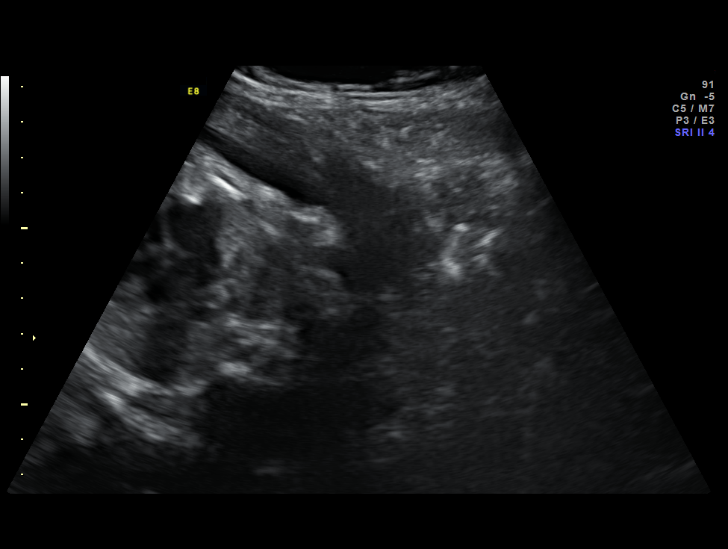

[14 of 28 positions shown; findings below may reference images not displayed]

Canned report from images found in remote index.

Refer to host system for actual result text.

## 2013-05-30 NOTE — Telephone Encounter (Signed)
Patient should return depending on labs.  Please advise when she should return.  Are you doing Crestor?

## 2013-05-31 NOTE — Telephone Encounter (Signed)
Ok to refill her crestor for 3 months.   Please talk with patinet about  [facility] Advise we repeat the CPK in the next month or so . No ov unless not feeling well or has muscle aches.    Otherwise ROV before runs out of 3 months of med.

## 2013-06-01 ENCOUNTER — Other Ambulatory Visit: Payer: Self-pay | Admitting: Family Medicine

## 2013-06-01 ENCOUNTER — Telehealth: Payer: Self-pay | Admitting: Family Medicine

## 2013-06-01 DIAGNOSIS — E785 Hyperlipidemia, unspecified: Secondary | ICD-10-CM

## 2013-06-01 NOTE — Telephone Encounter (Signed)
done

## 2013-06-01 NOTE — Telephone Encounter (Signed)
Please contact the patient and have her come in for lab work in 1 month.  I have placed the order in the system She will need a follow up with [facility] before her 90 day prescription (Crestor) runs out.  Please make appt. If not feeling well than she may have appt sooner.  Thanks!

## 2013-06-01 NOTE — Telephone Encounter (Signed)
Pt called to inquire about the status of this refill. Please assist.  °

## 2013-06-09 ENCOUNTER — Encounter: Payer: Self-pay | Admitting: Internal Medicine

## 2013-06-27 ENCOUNTER — Other Ambulatory Visit: Payer: BC Managed Care – PPO

## 2013-06-29 ENCOUNTER — Encounter: Payer: BC Managed Care – PPO | Admitting: Gastroenterology

## 2013-06-30 ENCOUNTER — Other Ambulatory Visit (INDEPENDENT_AMBULATORY_CARE_PROVIDER_SITE_OTHER): Payer: BC Managed Care – PPO

## 2013-06-30 DIAGNOSIS — E785 Hyperlipidemia, unspecified: Secondary | ICD-10-CM

## 2013-06-30 DIAGNOSIS — I10 Essential (primary) hypertension: Secondary | ICD-10-CM

## 2013-06-30 DIAGNOSIS — E039 Hypothyroidism, unspecified: Secondary | ICD-10-CM

## 2013-06-30 LAB — LIPID PANEL
Cholesterol: 130 mg/dL (ref 0–200)
HDL: 46.1 mg/dL (ref >39.00)
LDL Cholesterol: 69 mg/dL (ref 0–99)
Total CHOL/HDL Ratio: 3
Triglycerides: 76 mg/dL (ref 0.0–149.0)
VLDL: 15.2 mg/dL (ref 0.0–40.0)

## 2013-06-30 LAB — HEPATIC FUNCTION PANEL
ALT: 21 U/L (ref 0–35)
AST: 22 U/L (ref 0–37)
Albumin: 4.1 g/dL (ref 3.5–5.2)
Alkaline Phosphatase: 70 U/L (ref 39–117)
Bilirubin, Direct: 0 mg/dL (ref 0.0–0.3)
Total Bilirubin: 0.5 mg/dL (ref 0.3–1.2)
Total Protein: 7.3 g/dL (ref 6.0–8.3)

## 2013-06-30 LAB — TSH: TSH: 2 u[IU]/mL (ref 0.35–5.50)

## 2013-06-30 LAB — CK: Total CK: 104 U/L (ref 7–177)

## 2013-06-30 LAB — T4, FREE: Free T4: 0.82 ng/dL (ref 0.60–1.60)

## 2013-07-01 ENCOUNTER — Encounter: Payer: Self-pay | Admitting: Family Medicine

## 2013-07-18 ENCOUNTER — Telehealth: Payer: Self-pay | Admitting: *Deleted

## 2013-07-18 ENCOUNTER — Ambulatory Visit (AMBULATORY_SURGERY_CENTER): Payer: Self-pay | Admitting: *Deleted

## 2013-07-18 VITALS — Ht 67.0 in | Wt 232.8 lb

## 2013-07-18 DIAGNOSIS — K625 Hemorrhage of anus and rectum: Secondary | ICD-10-CM

## 2013-07-18 MED ORDER — NA SULFATE-K SULFATE-MG SULF 17.5-3.13-1.6 GM/177ML PO SOLN: 1.0000 | Freq: Once | ORAL | Status: DC

## 2013-07-18 NOTE — Telephone Encounter (Signed)
I would still scheduled her for an office visit

## 2013-07-18 NOTE — Telephone Encounter (Signed)
New pt appt scheduled with Doug Sou on Wenesday 10/15 at 8:30.  Colonoscopy scheduled for 11/4 was not cancelled.  Pt was given instructions for colonoscopy during PV.

## 2013-07-18 NOTE — Telephone Encounter (Signed)
I think an office visit would be appropriate.

## 2013-07-18 NOTE — Telephone Encounter (Signed)
Pt scheduled for direct colonoscopy 11/4 with Dr. Arlyce Dice.  Last colonoscopy 2007 with [facility].  Pt says she thinks it was normal.  Pt says she is having bright red rectal bleeding about every 2 months x 2 years.  Denies rectal pain, rectal pressure, or change in bowel habits.    Release of information form signed and given to Merri Ray, CMA.  Dr Arlyce Dice: Is pt okay for direct colonoscopy for rectal bleeding? Thanks, Olegario Messier in [facility]

## 2013-07-18 NOTE — Progress Notes (Signed)
Pt scheduled for direct colonoscopy with Dr. Arlyce Dice 11/4.  Pt says that she is having intermittent bright red rectal bleeding about every 2 months x 2 years.  Denies rectal pain, change in bowel habits.  Per Dr. Arlyce Dice pt needs OV.  New pt appt scheduled with Doug Sou on Wenewday 10/15 at 8:30.  Colonoscopy scheduled for 11/4 was not cancelled.  Pt was given instructions for colonoscopy during PV

## 2013-07-19 ENCOUNTER — Encounter: Payer: Self-pay | Admitting: Gastroenterology

## 2013-07-19 ENCOUNTER — Other Ambulatory Visit: Payer: Self-pay

## 2013-07-19 DIAGNOSIS — Z1231 Encounter for screening mammogram for malignant neoplasm of breast: Secondary | ICD-10-CM

## 2013-07-20 ENCOUNTER — Ambulatory Visit (INDEPENDENT_AMBULATORY_CARE_PROVIDER_SITE_OTHER): Payer: BC Managed Care – PPO | Admitting: Gastroenterology

## 2013-07-20 ENCOUNTER — Other Ambulatory Visit (INDEPENDENT_AMBULATORY_CARE_PROVIDER_SITE_OTHER): Payer: BC Managed Care – PPO

## 2013-07-20 ENCOUNTER — Encounter: Payer: Self-pay | Admitting: Gastroenterology

## 2013-07-20 VITALS — BP 114/80 | HR 84 | Ht 67.0 in | Wt 230.0 lb

## 2013-07-20 DIAGNOSIS — R194 Change in bowel habit: Secondary | ICD-10-CM

## 2013-07-20 DIAGNOSIS — K625 Hemorrhage of anus and rectum: Secondary | ICD-10-CM

## 2013-07-20 DIAGNOSIS — R198 Other specified symptoms and signs involving the digestive system and abdomen: Secondary | ICD-10-CM

## 2013-07-20 LAB — CBC WITH DIFFERENTIAL/PLATELET
Basophils Absolute: 0 10*3/uL (ref 0.0–0.1)
Basophils Relative: 0.3 % (ref 0.0–3.0)
Eosinophils Absolute: 0.1 10*3/uL (ref 0.0–0.7)
Eosinophils Relative: 2.2 % (ref 0.0–5.0)
HCT: 41.7 % (ref 36.0–46.0)
Hemoglobin: 13.9 g/dL (ref 12.0–15.0)
Lymphocytes Relative: 25.2 % (ref 12.0–46.0)
Lymphs Abs: 1.6 10*3/uL (ref 0.7–4.0)
MCHC: 33.3 g/dL (ref 30.0–36.0)
MCV: 87.9 fl (ref 78.0–100.0)
Monocytes Absolute: 0.5 10*3/uL (ref 0.1–1.0)
Monocytes Relative: 7.7 % (ref 3.0–12.0)
Neutro Abs: 4.2 10*3/uL (ref 1.4–7.7)
Neutrophils Relative %: 64.6 % (ref 43.0–77.0)
Platelets: 216 10*3/uL (ref 150.0–400.0)
RBC: 4.74 Mil/uL (ref 3.87–5.11)
RDW: 14.1 % (ref 11.5–14.6)
WBC: 6.4 10*3/uL (ref 4.5–10.5)

## 2013-07-20 LAB — BASIC METABOLIC PANEL
BUN: 15 mg/dL (ref 6–23)
CO2: 26 mEq/L (ref 19–32)
Calcium: 9.3 mg/dL (ref 8.4–10.5)
Chloride: 105 mEq/L (ref 96–112)
Creatinine, Ser: 0.9 mg/dL (ref 0.4–1.2)
GFR: 72.21 mL/min (ref >60.00)
Glucose, Bld: 102 mg/dL — ABNORMAL HIGH (ref 70–99)
Potassium: 4.2 mEq/L (ref 3.5–5.1)
Sodium: 140 mEq/L (ref 135–145)

## 2013-07-20 NOTE — Patient Instructions (Signed)
Please go to the basement level to have your labs drawn.  You will be able to view your results in, " MY CHART".

## 2013-07-20 NOTE — Progress Notes (Signed)
07/20/2013 Alison Matthews [id number] 1956-05-30   HISTORY OF PRESENT ILLNESS:  Patient is a pleasant 57 year old female who is known to our practice.  She had already been scheduled for a colonoscopy with Dr. Arlyce Dice on 11/4, however, it was recommended that she come in for a visit since she is having some GI concerns.  She says that her biggest concern is the rectal bleeding.  Has experienced intermittent bright red blood in the toilet bowl about once every two months for the past two years.  Denies rectal discomfort.  Bleeding resolves without intervention.  Her last colonoscopy was reportedly 8 years ago and was normal, but we are working on getting those records from [facility].  Says that her bowel alternate between "loose/soft" stools and constipation.  Has bloating as well.  No abdominal pain per se.  No weight loss or decreased appetite.  No family history of colon cancer to her knowledge.  TSH was normal.   Past Medical History  Diagnosis Date  . Postmenopausal   . SVT (supraventricular tachycardia)   . 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 Morton's neuroma   . Shoulder pain, left   . Breast pain     right  . Foot pain     bilateral  . Tingling   . Paroxysmal atrial fibrillation     During pregnancy 2012, s/p DCCV x 2  . Skin cancer of trunk 02/14/2012  . History of PFTs 2014    nl  . H/O bone density study 2014    nl   . History of ETT     nl low exrcise capacity   Past Surgical History  Procedure Laterality Date  . Cesarean section  10/10  . Polypectomy  9/09    Uterine  . Wisdom tooth extraction  8/76  . Pars plana vitrectomy w/ repair of macular hole  12/09  . Cataract extraction  2/10  . Cesarean section  07/14/2011    Procedure: CESAREAN SECTION;  Surgeon: Loney Laurence;  Location: [facility];  Service: Gynecology;  Laterality: N/A;  Repeat  . Cardiac catheterization  12/24/11    cardiac ablation  . Macular surgery      reports that she has never  smoked. She has never used smokeless tobacco. She reports that she does not drink alcohol or use illicit drugs. family history includes Arthritis in an other family member; Breast cancer in her maternal aunt; Diabetes in her paternal grandmother; Heart disease in her father; Hypertension in an other family member; Osteoporosis in an other family member. There is no history of Colon cancer. No Known Allergies    Outpatient Encounter Prescriptions as of 07/20/2013  Medication Sig Dispense Refill  . aspirin 81 MG tablet Take 81 mg by mouth daily.      . CRESTOR 20 MG tablet TAKE 1 TABLET BY MOUTH ONCE DAILY  90 tablet  0  . ibuprofen (ADVIL,MOTRIN) 200 MG tablet Take 200 mg by mouth every 6 (six) hours as needed for pain.      Marland Kitchen labetalol (NORMODYNE) 200 MG tablet TAKE 1 TABLET BY MOUTH TWICE DAILY  180 tablet  3  . levothyroxine (SYNTHROID, LEVOTHROID) 25 MCG tablet TAKE 1 TABLET BY MOUTH ONCE DAILY  30 tablet  5  . Na Sulfate-K Sulfate-Mg Sulf (SUPREP BOWEL PREP) SOLN Take 1 kit by mouth once. suprep as directed.  No substitutions  354 mL  0  . omeprazole (PRILOSEC) 20  MG capsule Take 20 mg by mouth daily. OTC       No facility-administered encounter medications on file as of 07/20/2013.     REVIEW OF SYSTEMS  : All other systems reviewed and negative except where noted in the History of Present Illness.   PHYSICAL EXAM: BP 114/80  Pulse 84  Ht 5\' 7"  (1.702 m)  Wt 104.327 kg (230 lb)  BMI 36.01 kg/m2 General: Well developed white female in no acute distress Head: Normocephalic and atraumatic Eyes:  sclerae anicteric,conjunctive pink. Ears: Normal auditory acuity Lungs: Clear throughout to auscultation Heart: Regular rate and rhythm Abdomen: Soft, non-tender, non-distended. No masses or hepatomegaly noted. Normal bowel sounds.  Low transverse C-section scar noted. Rectal:  Deferred.  Will be done at the time of colonoscopy. Musculoskeletal: Symmetrical with no gross deformities   Skin: No lesions on visible extremities Extremities: No edema  Neurological: Alert oriented x 4, grossly nonfocal Psychological:  Alert and cooperative. Normal mood and affect  ASSESSMENT AND PLAN: -Rectal bleeding:  Intermittent bright red blood seen in toilet bowl over the past two years.  Likely secondary to hemorrhoids, but rule out other source.  She is already scheduled for colonoscopy on 11/4.  Will check a CBC today (as well as a BMP, at her request). -Change in bowel habits with alternating constipation and loose stools.

## 2013-07-21 NOTE — Progress Notes (Signed)
Reviewed and agree with management. Malayah Demuro D. Dasiah Hooley, M.D., FACG  

## 2013-07-21 NOTE — Telephone Encounter (Signed)
Received pts records  They are on dr Nita Sells desk

## 2013-07-27 NOTE — Telephone Encounter (Signed)
Patient does not need a colon until 2018

## 2013-07-27 NOTE — Telephone Encounter (Signed)
Dr Arlyce Dice  This patient wants to continue with her colonoscopy  She has been having a lot of rectal bleeding  I left her on the schedule

## 2013-08-09 ENCOUNTER — Ambulatory Visit (AMBULATORY_SURGERY_CENTER): Payer: BC Managed Care – PPO | Admitting: Gastroenterology

## 2013-08-09 ENCOUNTER — Encounter: Payer: Self-pay | Admitting: Gastroenterology

## 2013-08-09 VITALS — BP 123/67 | HR 55 | Temp 98.3°F | Resp 20 | Ht 67.0 in | Wt 232.0 lb

## 2013-08-09 DIAGNOSIS — K648 Other hemorrhoids: Secondary | ICD-10-CM

## 2013-08-09 DIAGNOSIS — D126 Benign neoplasm of colon, unspecified: Secondary | ICD-10-CM

## 2013-08-09 DIAGNOSIS — K625 Hemorrhage of anus and rectum: Secondary | ICD-10-CM

## 2013-08-09 MED ORDER — SODIUM CHLORIDE 0.9 % IV SOLN: 500.0000 mL | INTRAVENOUS | Status: DC

## 2013-08-09 NOTE — Progress Notes (Signed)
Patient did not experience any of the following events: a burn prior to discharge; a fall within the facility; wrong site/side/patient/procedure/implant event; or a hospital transfer or hospital admission upon discharge from the facility. (G8907) Patient did not have preoperative order for IV antibiotic SSI prophylaxis. (G8918)  

## 2013-08-09 NOTE — Progress Notes (Signed)
Called to room to assist during endoscopic procedure.  Patient ID and intended procedure confirmed with present staff. Received instructions for my participation in the procedure from the performing physician.  

## 2013-08-09 NOTE — Progress Notes (Signed)
  [facility] Anesthesia Post-op Note  Patient: Alison Matthews  Procedure(s) Performed: colonoscopy  Patient Location: [facility] - Recovery Area  Anesthesia Type: Deep Sedation/Propofol  Level of Consciousness: awake, oriented and patient cooperative  Airway and Oxygen Therapy: Patient Spontanous Breathing  Post-op Pain: none  Post-op Assessment:  Post-op Vital signs reviewed, Patient's Cardiovascular Status Stable, Respiratory Function Stable, Patent Airway, No signs of Nausea or vomiting and Pain level controlled  Post-op Vital Signs: Reviewed and stable  Complications: No apparent anesthesia complications  Juni Glaab E 11:32 AM

## 2013-08-09 NOTE — Op Note (Signed)
[facility] [address] [place], [address]   COLONOSCOPY PROCEDURE REPORT  PATIENT: Alison, Matthews  MR#: [id number] BIRTHDATE: 1955-12-01 , 57  yrs. old GENDER: Female ENDOSCOPIST: Louis Meckel, MD REFERRED BY: Berniece Andreas, MD PROCEDURE DATE:  08/09/2013 PROCEDURE:   Colonoscopy with cold biopsy polypectomy First Screening Colonoscopy - Avg.  risk and is 50 yrs.  old or older Yes.  Prior Negative Screening - Now for repeat screening. N/A  History of Adenoma - Now for follow-up colonoscopy & has been > or = to 3 yrs.  N/A  Polyps Removed Today? Yes. ASA CLASS:   Class II INDICATIONS:Rectal Bleeding. MEDICATIONS: MAC sedation, administered by CRNA and propofol (Diprivan) 200mg  IV  DESCRIPTION OF PROCEDURE:   After the risks benefits and alternatives of the procedure were thoroughly explained, informed consent was obtained.  A digital rectal exam revealed several skin tags.   The LB [id number]  endoscope was introduced through the anus and advanced to the cecum, which was identified by both the appendix and ileocecal valve. No adverse events experienced. The quality of the prep was excellent using Suprep  The instrument was then slowly withdrawn as the colon was fully examined.      COLON FINDINGS: A sessile polyp measuring 2 mm in size was found at the cecum.  A polypectomy was performed with cold forceps. The remainder the exam is normal. Retroflexed views revealed internal hemorrhoids. The time to cecum=2 minutes .6 seconds.  Withdrawal time=6 minutes 25 seconds.  The scope was withdrawn and the procedure completed. COMPLICATIONS: There were no complications.  ENDOSCOPIC IMPRESSION: Sessile polyp measuring 2 mm in size was found at the cecum; polypectomy was performed with cold forceps hemorrhoids  RECOMMENDATIONS: 1.  If the polyp(s) removed today are proven to be adenomatous (pre-cancerous) polyps, you will need a repeat colonoscopy  in 5 years.  Otherwise you should continue to follow colorectal cancer screening guidelines for "routine risk" patients with colonoscopy in 10 years.  You will receive a letter within 1-2 weeks with the results of your biopsy as well as final recommendations.  Please call my office if you have not received a letter after 3 weeks. 2.  band ligation of hemorrhoids for recurrent rectal bleeding   eSigned:  Louis Meckel, MD 08/09/2013 11:33 AM   cc:   PATIENT NAME:  Alison, Matthews MR#: [id number]

## 2013-08-09 NOTE — Patient Instructions (Signed)
YOU HAD AN ENDOSCOPIC PROCEDURE TODAY AT THE [facility]: Refer to the procedure report that was given to you for any specific questions about what was found during the examination.  If the procedure report does not answer your questions, please call your gastroenterologist to clarify.  If you requested that your care partner not be given the details of your procedure findings, then the procedure report has been included in a sealed envelope for you to review at your convenience later.  YOU SHOULD EXPECT: Some feelings of bloating in the abdomen. Passage of more gas than usual.  Walking can help get rid of the air that was put into your GI tract during the procedure and reduce the bloating. If you had a lower endoscopy (such as a colonoscopy or flexible sigmoidoscopy) you may notice spotting of blood in your stool or on the toilet paper. If you underwent a bowel prep for your procedure, then you may not have a normal bowel movement for a few days.  DIET: Your first meal following the procedure should be a light meal and then it is ok to progress to your normal diet.  A half-sandwich or bowl of soup is an example of a good first meal.  Heavy or fried foods are harder to digest and may make you feel nauseous or bloated.  Likewise meals heavy in dairy and vegetables can cause extra gas to form and this can also increase the bloating.  Drink plenty of fluids but you should avoid alcoholic beverages for 24 hours.  ACTIVITY: Your care partner should take you home directly after the procedure.  You should plan to take it easy, moving slowly for the rest of the day.  You can resume normal activity the day after the procedure however you should NOT DRIVE or use heavy machinery for 24 hours (because of the sedation medicines used during the test).    SYMPTOMS TO REPORT IMMEDIATELY: A gastroenterologist can be reached at any hour.  During normal business hours, 8:30 AM to 5:00 PM Monday through Friday,  call [phone].  After hours and on weekends, please call the GI answering service at [phone] who will take a message and have the physician on call contact you.   Following lower endoscopy (colonoscopy or flexible sigmoidoscopy):  Excessive amounts of blood in the stool  Significant tenderness or worsening of abdominal pains  Swelling of the abdomen that is new, acute  Fever of 100F or higher  FOLLOW UP: If any biopsies were taken you will be contacted by phone or by letter within the next 1-3 weeks.  Call your gastroenterologist if you have not heard about the biopsies in 3 weeks.  Our staff will call the home number listed on your records the next business day following your procedure to check on you and address any questions or concerns that you may have at that time regarding the information given to you following your procedure. This is a courtesy call and so if there is no answer at the home number and we have not heard from you through the emergency physician on call, we will assume that you have returned to your regular daily activities without incident.  SIGNATURES/CONFIDENTIALITY: You and/or your care partner have signed paperwork which will be entered into your electronic medical record.  These signatures attest to the fact that that the information above on your After Visit Summary has been reviewed and is understood.  Full responsibility of the confidentiality of this   discharge information lies with you and/or your care-partner.  Polyps-handout given  Repeat colonoscopy will be determined by pathology   

## 2013-08-10 ENCOUNTER — Telehealth: Payer: Self-pay | Admitting: *Deleted

## 2013-08-10 NOTE — Telephone Encounter (Signed)
  Follow up Call-  Call back number 08/09/2013  Post procedure Call Back phone  # [phone]  Permission to leave phone message Yes     Patient questions:  Do you have a fever, pain , or abdominal swelling? no Pain Score  0 *  Have you tolerated food without any problems? yes  Have you been able to return to your normal activities? yes  Do you have any questions about your discharge instructions: Diet   no Medications  no Follow up visit  no  Do you have questions or concerns about your Care? no  Actions: * If pain score is 4 or above: No action needed, pain <4.

## 2013-08-12 ENCOUNTER — Ambulatory Visit
Admission: RE | Admit: 2013-08-12 | Discharge: 2013-08-12 | Disposition: A | Discharge status: Home / Self Care | Payer: BC Managed Care – PPO | Source: Ambulatory Visit

## 2013-08-12 DIAGNOSIS — Z1231 Encounter for screening mammogram for malignant neoplasm of breast: Secondary | ICD-10-CM

## 2013-08-16 ENCOUNTER — Encounter: Payer: Self-pay | Admitting: Gastroenterology

## 2013-08-16 ENCOUNTER — Telehealth: Payer: Self-pay | Admitting: Gastroenterology

## 2013-08-16 NOTE — Telephone Encounter (Signed)
Spoke with pt and reviewed results letter with her. 

## 2013-08-22 ENCOUNTER — Other Ambulatory Visit: Payer: Self-pay | Admitting: Family Medicine

## 2013-08-22 ENCOUNTER — Ambulatory Visit (INDEPENDENT_AMBULATORY_CARE_PROVIDER_SITE_OTHER): Payer: BC Managed Care – PPO | Admitting: Internal Medicine

## 2013-08-22 ENCOUNTER — Telehealth: Payer: Self-pay | Admitting: Internal Medicine

## 2013-08-22 ENCOUNTER — Encounter: Payer: Self-pay | Admitting: Internal Medicine

## 2013-08-22 VITALS — BP 136/78 | HR 68 | Temp 97.6°F | Wt 231.0 lb

## 2013-08-22 DIAGNOSIS — Z733 Stress, not elsewhere classified: Secondary | ICD-10-CM

## 2013-08-22 DIAGNOSIS — R931 Abnormal findings on diagnostic imaging of heart and coronary circulation: Secondary | ICD-10-CM

## 2013-08-22 DIAGNOSIS — I10 Essential (primary) hypertension: Secondary | ICD-10-CM

## 2013-08-22 DIAGNOSIS — E785 Hyperlipidemia, unspecified: Secondary | ICD-10-CM

## 2013-08-22 DIAGNOSIS — R9389 Abnormal findings on diagnostic imaging of other specified body structures: Secondary | ICD-10-CM

## 2013-08-22 DIAGNOSIS — F439 Reaction to severe stress, unspecified: Secondary | ICD-10-CM

## 2013-08-22 MED ORDER — LEVOTHYROXINE SODIUM 25 MCG PO TABS: ORAL_TABLET | ORAL | Status: DC

## 2013-08-22 NOTE — Telephone Encounter (Signed)
Pt forgot to request refilll of levothyroxine (SYNTHROID, LEVOTHROID) 25 MCG tablet 90 day w/ one refill Pharm [facility]/ [address]

## 2013-08-22 NOTE — Progress Notes (Signed)
Chief Complaint  Patient presents with  . Follow-up    HPI: Patient comes in today for follow up of  multiple medical problems.    Had colon for rectal bleeding constipation ; polyp and hemorrhoids  BP  ok some . Still on labetolol no tachycardia sx   Father died 2 year ago. Sib relationship an issue.  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  Taking lipid med no noted se noted    Had labs done recnetly   Denies sig depression  But family issues  ? If counseling or other would help.  ROS: See pertinent positives and negatives per HPI.  Past Medical History  Diagnosis Date  . Postmenopausal   . SVT (supraventricular tachycardia)   . 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 Morton's neuroma   . Shoulder pain, left   . Breast pain     right  . Foot pain     bilateral  . Tingling   . Paroxysmal atrial fibrillation     During pregnancy 03-03-11, s/p DCCV x 2  . Skin cancer of trunk 02/14/2012  . History of PFTs 2014    nl  . H/O bone density study 2014    nl   . History of ETT     nl low exrcise capacity    Family History  Problem Relation Age of Onset  . Hypertension    . Osteoporosis    . Arthritis    . Colon cancer Neg Hx   . Heart disease Father   . Breast cancer Maternal Aunt   . Diabetes Paternal Grandmother     History   Social History  . Marital Status: Married    Spouse Name: N/A    Number of Children: N/A  . Years of Education: N/A   Social History Main Topics  . Smoking status: Never Smoker   . Smokeless tobacco: Never Used  . Alcohol Use: Yes     Comment:  2 DRINKS A MONTH  . Drug Use: No  . Sexual Activity: Yes   Other Topics Concern  . 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 aspirin 81 MG tablet Take 81 mg by mouth daily.  . CRESTOR 20 MG tablet TAKE 1 TABLET BY MOUTH ONCE DAILY  . ibuprofen (ADVIL,MOTRIN) 200 MG tablet Take 200 mg by mouth every 6 (six) hours as needed for pain.  Marland Kitchen labetalol (NORMODYNE) 200 MG tablet TAKE 1 TABLET BY MOUTH TWICE DAILY  . omeprazole (PRILOSEC) 20 MG capsule Take 20 mg by mouth daily. OTC  . [DISCONTINUED] levothyroxine (SYNTHROID, LEVOTHROID) 25 MCG tablet TAKE 1 TABLET BY MOUTH ONCE DAILY    EXAM:  BP 136/78  Pulse 68  Temp(Src) 97.6 F (36.4 C) (Oral)  Wt 231 lb (104.781 kg)  SpO2 97%  Body mass index is 36.17 kg/(m^2).  GENERAL: vitals reviewed and listed above, alert, oriented, appears well hydrated and in no  acute distress Pleasant  In nad  HEENT: atraumatic, conjunctiva  clear, no obvious abnormalities on inspection of external nose and ears   NECK: no obvious masses on inspection palpation  LUNGS: clear to auscultation bilaterally, no wheezes, rales or rhonchi, good air movement CV: HRRR, no clubbing cyanosis or  peripheral edema nl cap refill NV seems intact  Feet  Nl pulses  Harder to feel on left but nl  MS: moves all extremities without noticeable focal  abnormality PSYCH: pleasant and cooperative, no obvious depression or anxiety Lab Results  Component Value Date   WBC 6.4 07/20/2013   HGB 13.9 07/20/2013   HCT 41.7 07/20/2013   PLT 216.0 07/20/2013   GLUCOSE 102* 07/20/2013   CHOL 130 06/30/2013   TRIG 76.0 06/30/2013   HDL 46.10 06/30/2013   LDLDIRECT 149.6 01/11/2010   LDLCALC 69 06/30/2013   ALT 21 06/30/2013   AST 22 06/30/2013   NA 140 07/20/2013   K 4.2 07/20/2013   CL 105 07/20/2013   CREATININE 0.9 07/20/2013   BUN 15 07/20/2013   CO2 26 07/20/2013   TSH 2.00 06/30/2013   INR 0.9 04/08/2008   HGBA1C 5.7 01/15/2009    ASSESSMENT AND PLAN:  Discussed the  following assessment and plan:  HYPERTENSION  Dyslipidemia  Agatston CAC score, >100 - 199  Other and unspecified hyperlipidemia  Stress - related to sisters relationship  Wt Readings from Last 3 Encounters:  08/22/13 231 lb (104.781 kg)  08/09/13 232 lb (105.235 kg)  07/20/13 230 lb (104.327 kg)   Total visit > 50% spent counseling and coordinating care    consider change bp med to acei  And or ccb since she had an ablation  She may not need this coverage . Readdress in future  To cards .  -Patient advised to return or notify health care team  if symptoms worsen or persist or new concerns arise.  Patient Instructions  Intensify lifestyle interventions. Monitoring   mindfulness will help.   Avoid sugars and simple carbs. Labs are stable .  In future consider  Changing bp med to ACEI   Uncertain  If labetolol   Is keeping your heart  Rate from racing .     Neta Mends. Favor Kreh M.D.

## 2013-08-22 NOTE — Patient Instructions (Signed)
Intensify lifestyle interventions. Monitoring   mindfulness will help.   Avoid sugars and simple carbs. Labs are stable .  In future consider  Changing bp med to ACEI   Uncertain  If labetolol   Is keeping your heart  Rate from racing .

## 2013-08-22 NOTE — Telephone Encounter (Signed)
Sent to the pharmacy by e-scribe. 

## 2013-08-25 DIAGNOSIS — E785 Hyperlipidemia, unspecified: Secondary | ICD-10-CM | POA: Insufficient documentation

## 2013-08-25 DIAGNOSIS — F439 Reaction to severe stress, unspecified: Secondary | ICD-10-CM | POA: Insufficient documentation

## 2013-09-05 ENCOUNTER — Other Ambulatory Visit: Payer: Self-pay | Admitting: Internal Medicine

## 2013-12-08 IMAGING — CR DG CHEST 2V
2 series · 2 of 2 positions shown · non-contrast
Comparison: 10/02/2011; 07/23/2010; 10/09/2006

CLINICAL DATA: Chest pain

CHEST - 2 VIEW

[w chest pa]
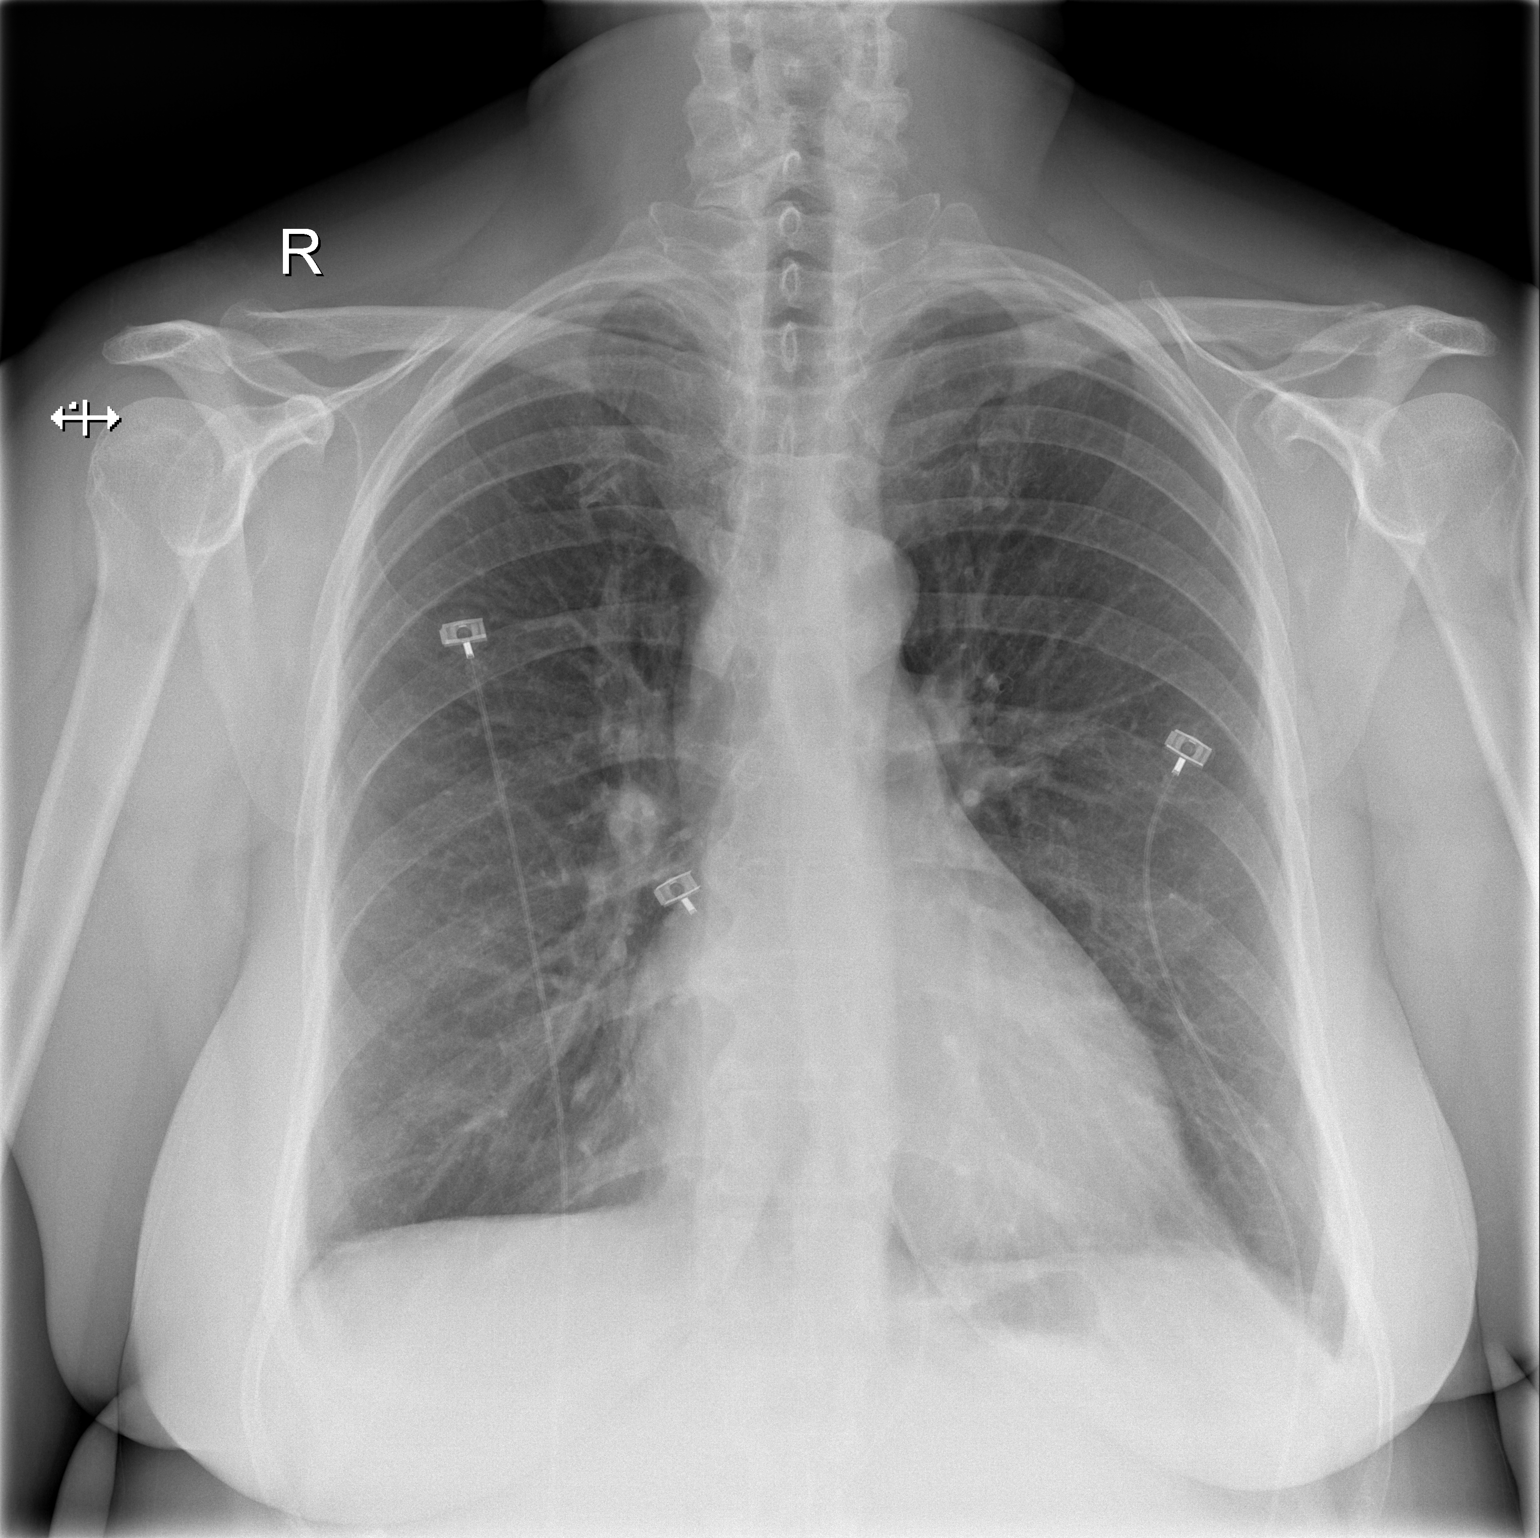

[w chest lat]
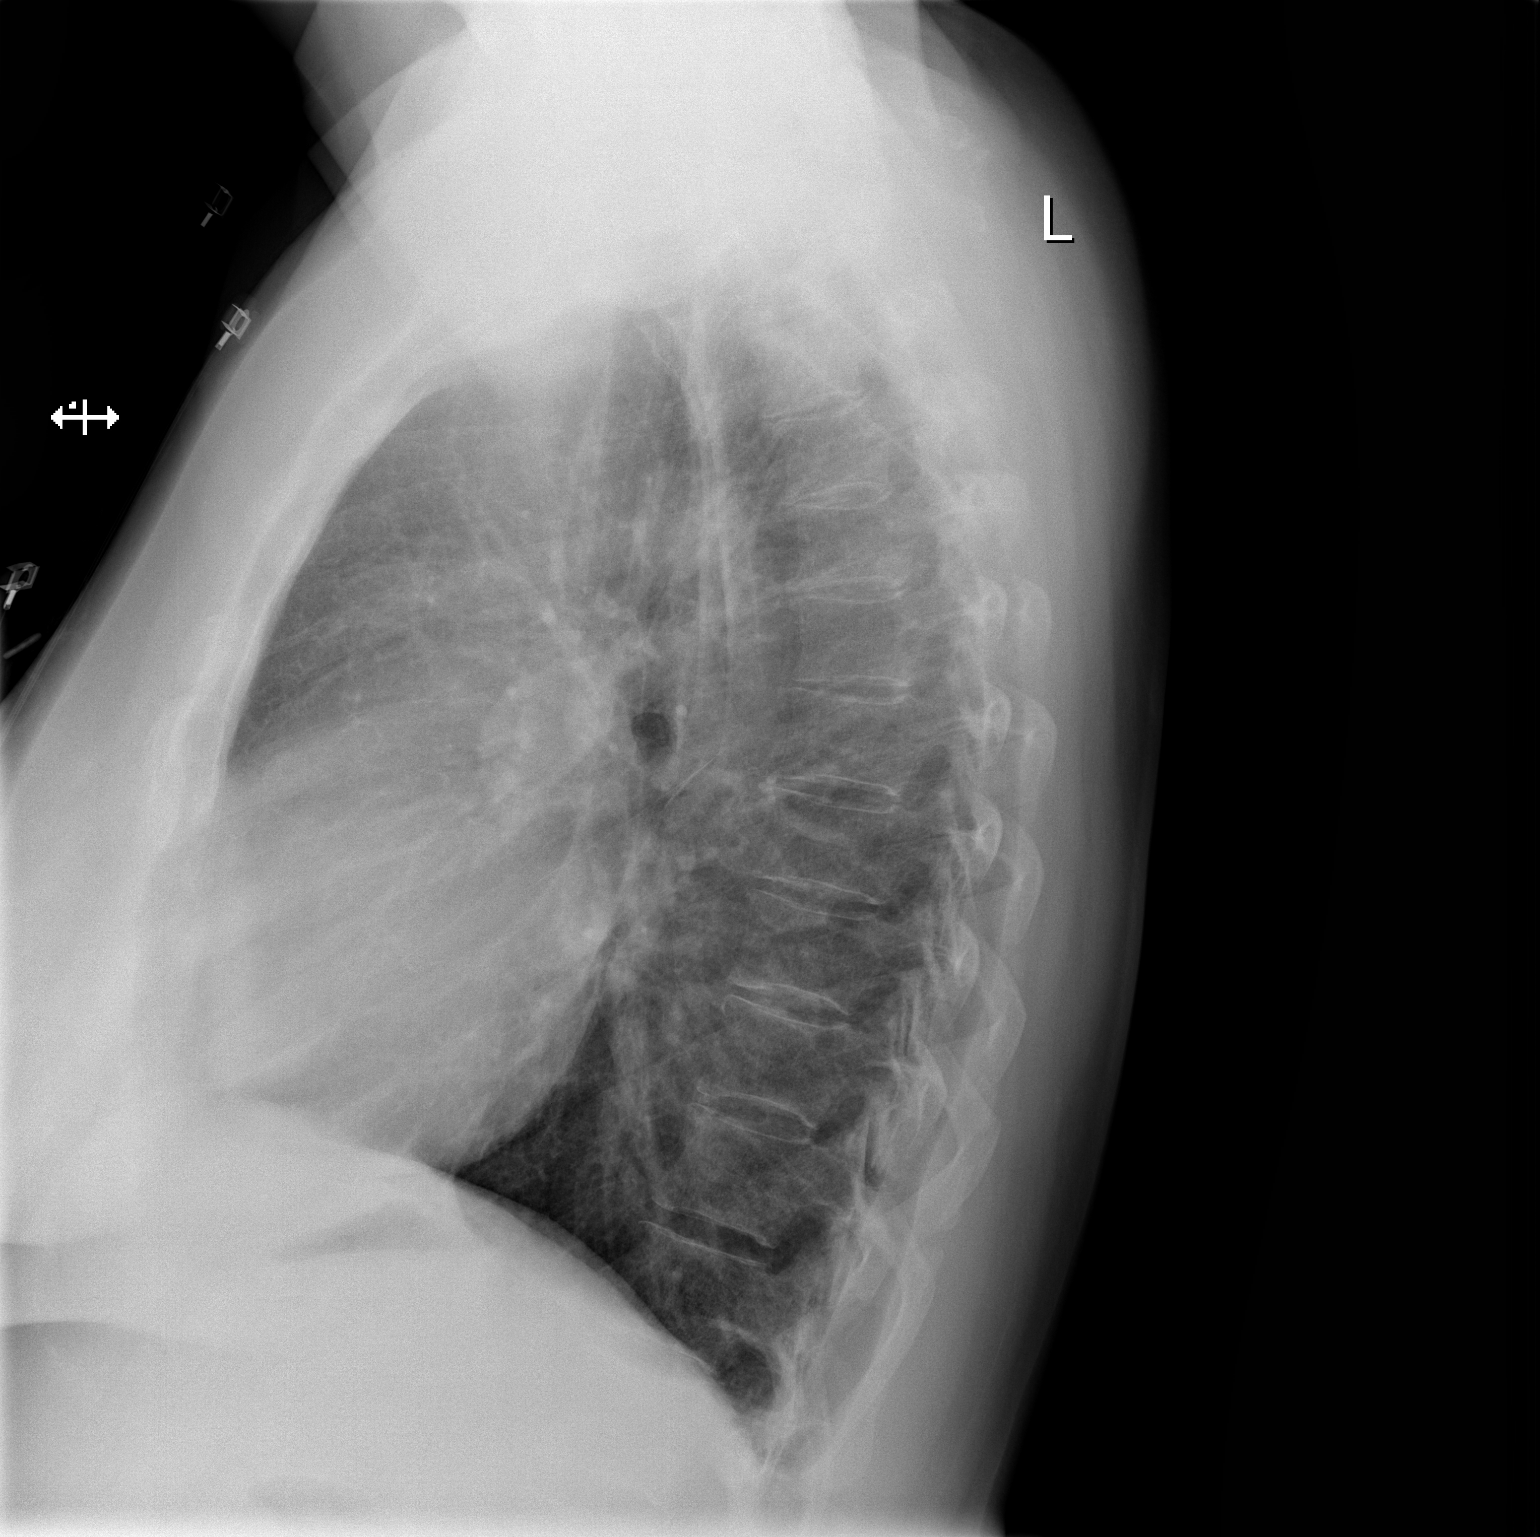

[2 of 2 positions shown; findings below may reference images not displayed]

FINDINGS: Grossly unchanged cardiac silhouette and mediastinal
contours.  There is persistent blunting of the bilateral
costophrenic angles, right greater than left, without definite
pleural effusion.  No pneumothorax.  No focal airspace opacities.
Unchanged bones.
IMPRESSION: No acute cardiopulmonary disease.

## 2014-02-13 ENCOUNTER — Other Ambulatory Visit: Payer: BC Managed Care – PPO

## 2014-02-19 ENCOUNTER — Other Ambulatory Visit: Payer: Self-pay | Admitting: Internal Medicine

## 2014-02-20 ENCOUNTER — Encounter: Payer: BC Managed Care – PPO | Admitting: Internal Medicine

## 2014-02-20 NOTE — Telephone Encounter (Signed)
Sent to the pharmacy by e-scribe. 

## 2014-03-27 ENCOUNTER — Other Ambulatory Visit: Payer: Self-pay | Admitting: Internal Medicine

## 2014-03-28 NOTE — Telephone Encounter (Signed)
Sent to the pharmacy by e-scribe for #90.  Pt has upcoming CPE in Aug.

## 2014-05-02 IMAGING — MG STANDARD SCREENING - COMBOHD
7 series · 8 of 15 positions shown · non-contrast
Comparison: Previous exam(s).

CLINICAL DATA: Screening.

EXAM:
DIGITAL SCREENING BILATERAL MAMMOGRAM WITH CAD
DIGITAL BREAST TOMOSYNTHESIS
Digital breast tomosynthesis images are acquired in two projections.
These images are reviewed in combination with the digital mammogram,
confirming the findings below.

[R CC]
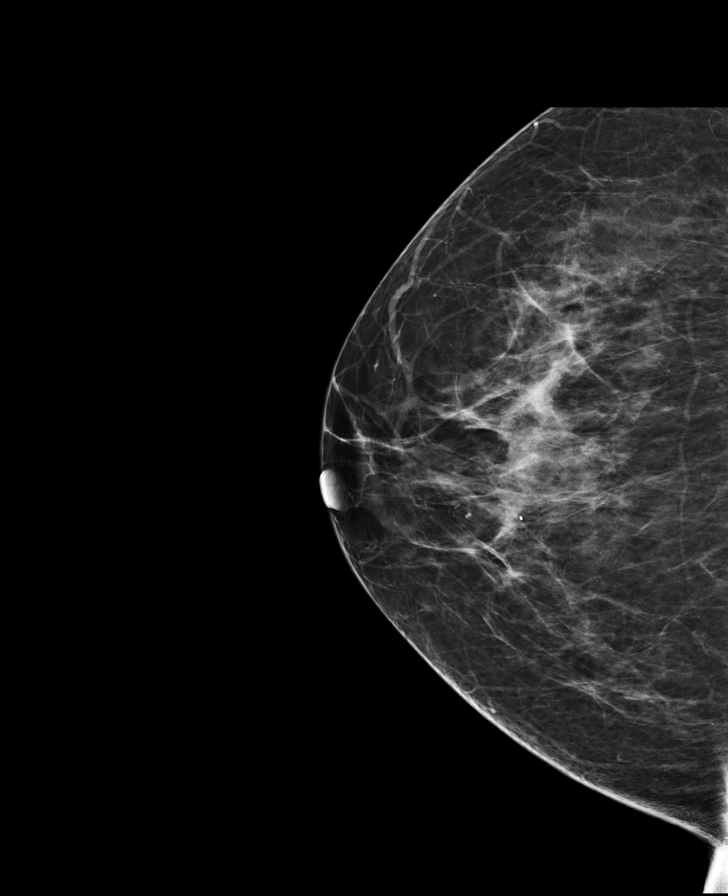

[R MLO]
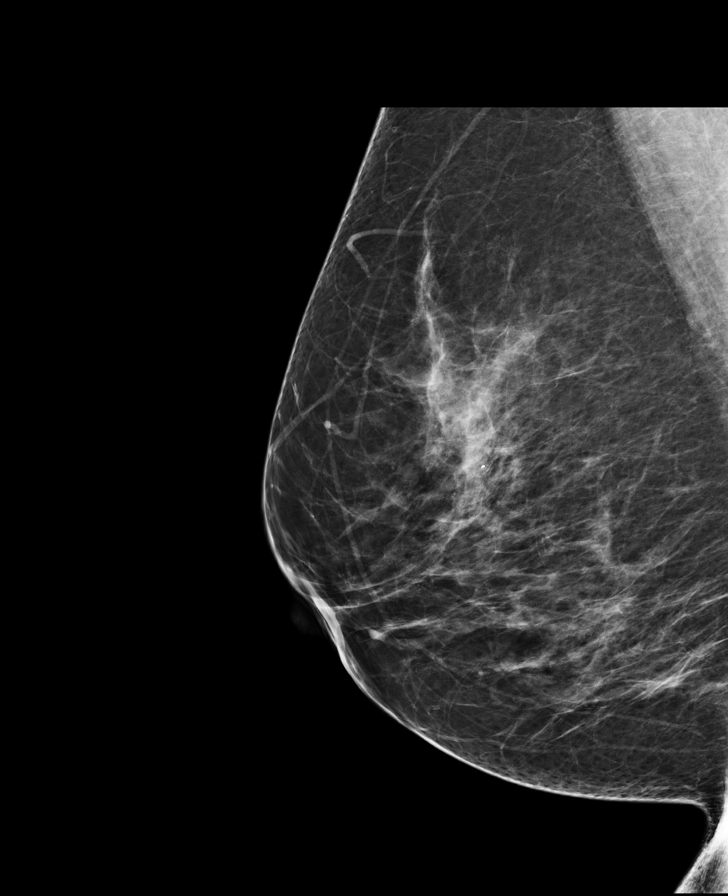

[R MLO tomo · 2 of 73 frames shown]
[frame 24/73]
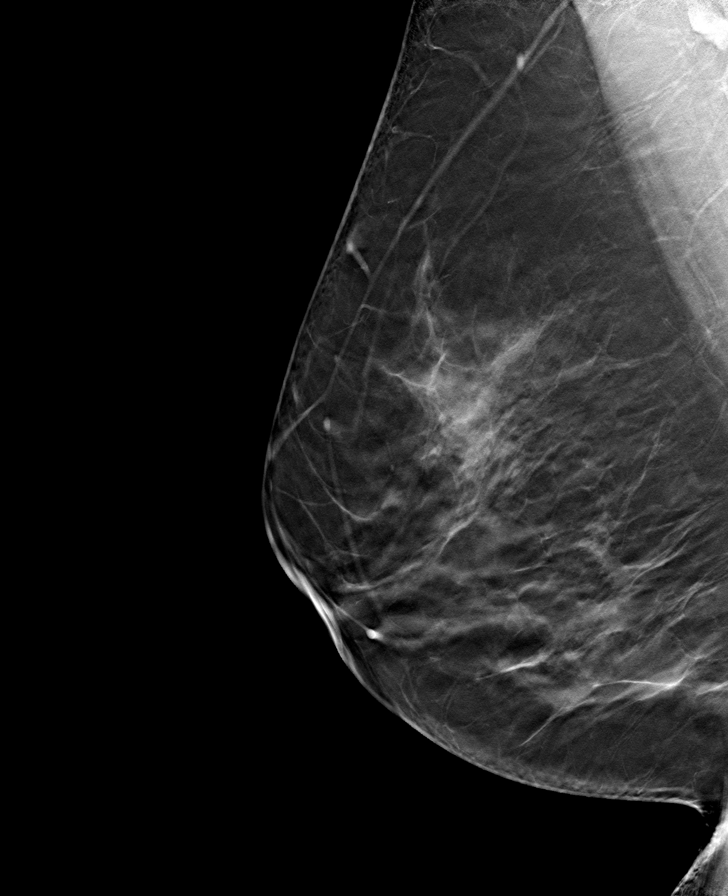
[frame 37/73]
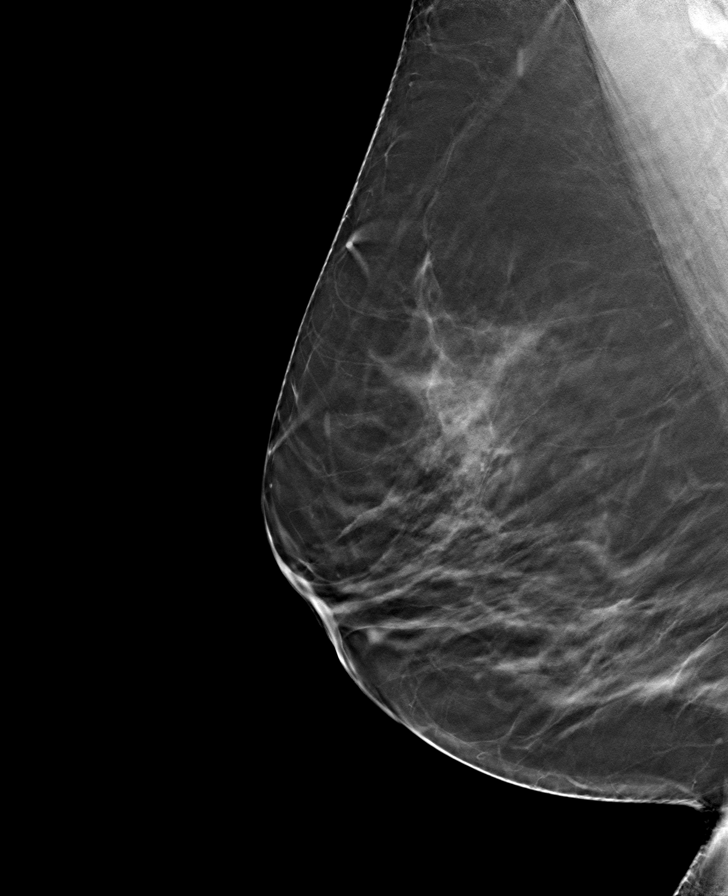

[R CC tomo · tomo slice 39/77.0]
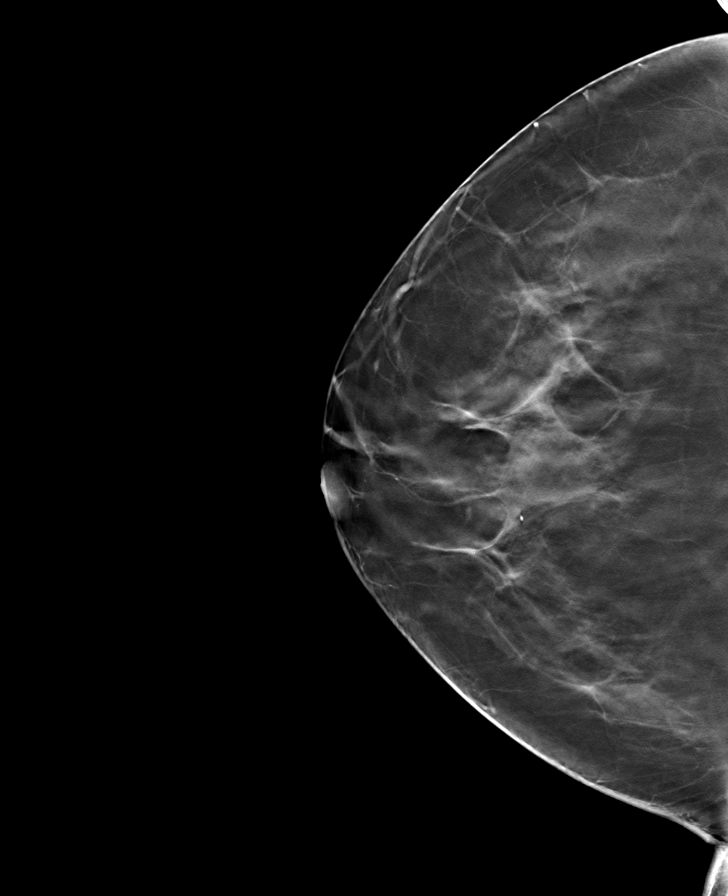

[L CC synth-2D]
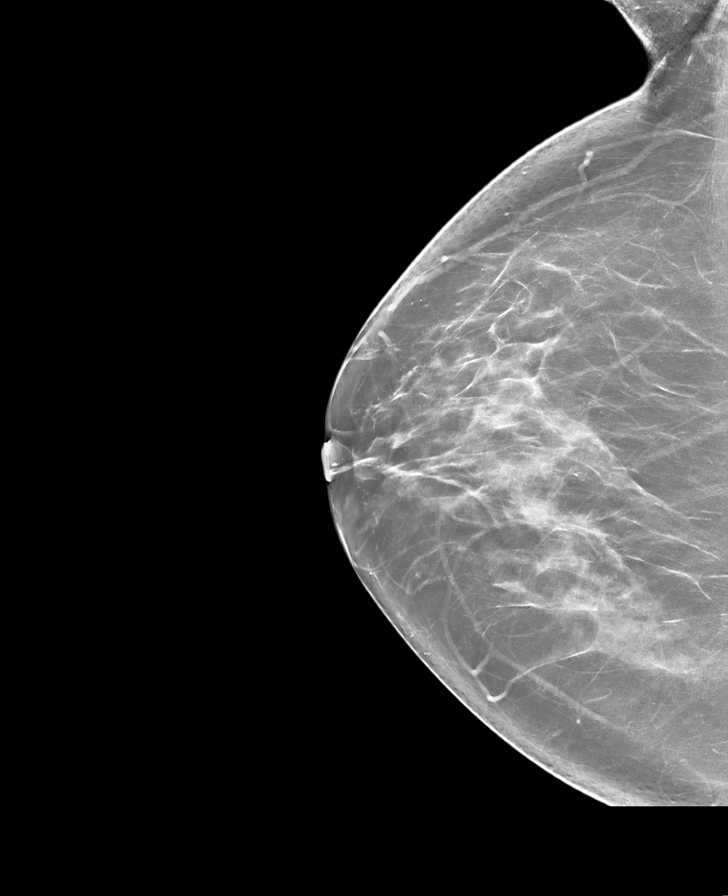

[R CC synth-2D]
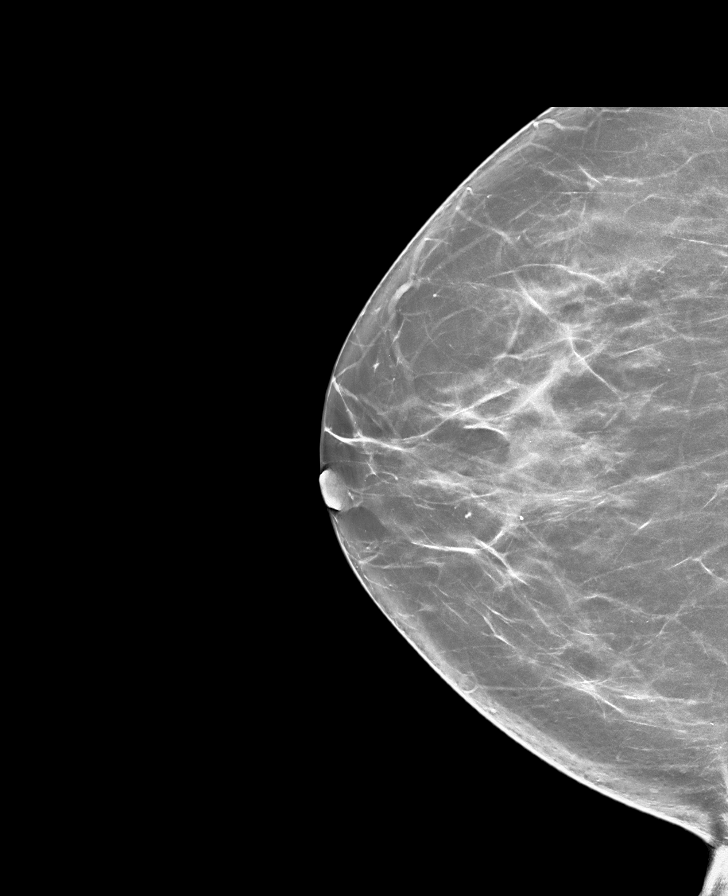

[R MLO synth-2D]
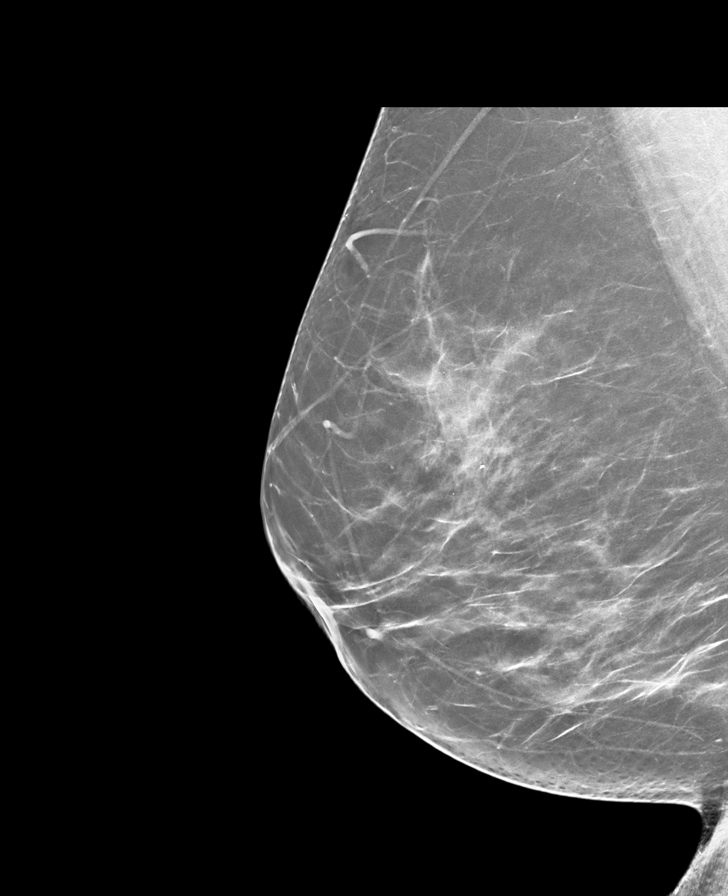

[8 of 15 positions shown; findings below may reference images not displayed]

ACR Breast Density Category b: There are scattered areas of
fibroglandular density.
FINDINGS: There are no findings suspicious for malignancy. Images were
processed with CAD.
IMPRESSION: No mammographic evidence of malignancy. A result letter of this
screening mammogram will be mailed directly to the patient.

RECOMMENDATION:
Screening mammogram in one year. (Code:Y6-I-0VQ)

BI-RADS CATEGORY  1: Negative

## 2014-05-17 ENCOUNTER — Other Ambulatory Visit (INDEPENDENT_AMBULATORY_CARE_PROVIDER_SITE_OTHER): Payer: BC Managed Care – PPO

## 2014-05-17 DIAGNOSIS — Z0000 Encounter for general adult medical examination without abnormal findings: Secondary | ICD-10-CM

## 2014-05-17 LAB — BASIC METABOLIC PANEL
BUN: 16 mg/dL (ref 6–23)
CO2: 27 mEq/L (ref 19–32)
Calcium: 9.5 mg/dL (ref 8.4–10.5)
Chloride: 108 mEq/L (ref 96–112)
Creatinine, Ser: 0.8 mg/dL (ref 0.4–1.2)
GFR: 76.06 mL/min (ref >60.00)
Glucose, Bld: 95 mg/dL (ref 70–99)
Potassium: 4.6 mEq/L (ref 3.5–5.1)
Sodium: 141 mEq/L (ref 135–145)

## 2014-05-17 LAB — HEPATIC FUNCTION PANEL
ALT: 17 U/L (ref 0–35)
AST: 22 U/L (ref 0–37)
Albumin: 4 g/dL (ref 3.5–5.2)
Alkaline Phosphatase: 75 U/L (ref 39–117)
Bilirubin, Direct: 0.1 mg/dL (ref 0.0–0.3)
Total Bilirubin: 0.5 mg/dL (ref 0.2–1.2)
Total Protein: 7.3 g/dL (ref 6.0–8.3)

## 2014-05-17 LAB — CBC WITH DIFFERENTIAL/PLATELET
Basophils Absolute: 0 10*3/uL (ref 0.0–0.1)
Basophils Relative: 0.6 % (ref 0.0–3.0)
Eosinophils Absolute: 0.2 10*3/uL (ref 0.0–0.7)
Eosinophils Relative: 2.8 % (ref 0.0–5.0)
HCT: 41.7 % (ref 36.0–46.0)
Hemoglobin: 13.8 g/dL (ref 12.0–15.0)
Lymphocytes Relative: 22.9 % (ref 12.0–46.0)
Lymphs Abs: 1.4 10*3/uL (ref 0.7–4.0)
MCHC: 33.1 g/dL (ref 30.0–36.0)
MCV: 89.5 fl (ref 78.0–100.0)
Monocytes Absolute: 0.6 10*3/uL (ref 0.1–1.0)
Monocytes Relative: 8.8 % (ref 3.0–12.0)
Neutro Abs: 4.1 10*3/uL (ref 1.4–7.7)
Neutrophils Relative %: 64.9 % (ref 43.0–77.0)
Platelets: 200 10*3/uL (ref 150.0–400.0)
RBC: 4.66 Mil/uL (ref 3.87–5.11)
RDW: 14.4 % (ref 11.5–15.5)
WBC: 6.3 10*3/uL (ref 4.0–10.5)

## 2014-05-17 LAB — LIPID PANEL
Cholesterol: 140 mg/dL (ref 0–200)
HDL: 43.9 mg/dL (ref >39.00)
LDL Cholesterol: 74 mg/dL (ref 0–99)
NonHDL: 96.1
Total CHOL/HDL Ratio: 3
Triglycerides: 113 mg/dL (ref 0.0–149.0)
VLDL: 22.6 mg/dL (ref 0.0–40.0)

## 2014-05-17 LAB — TSH: TSH: 2.32 u[IU]/mL (ref 0.35–4.50)

## 2014-05-17 LAB — HEMOGLOBIN A1C: Hgb A1c MFr Bld: 6.1 % (ref 4.6–6.5)

## 2014-05-21 ENCOUNTER — Other Ambulatory Visit: Payer: Self-pay | Admitting: Internal Medicine

## 2014-05-22 NOTE — Telephone Encounter (Signed)
Refilled for 90 days.  Pt has upcoming appt on 05/24/14

## 2014-05-24 ENCOUNTER — Ambulatory Visit (INDEPENDENT_AMBULATORY_CARE_PROVIDER_SITE_OTHER): Payer: BC Managed Care – PPO | Admitting: Internal Medicine

## 2014-05-24 ENCOUNTER — Encounter: Payer: Self-pay | Admitting: Internal Medicine

## 2014-05-24 ENCOUNTER — Telehealth: Payer: Self-pay | Admitting: Internal Medicine

## 2014-05-24 VITALS — BP 138/78 | Temp 99.0°F | Ht 66.0 in | Wt 233.0 lb

## 2014-05-24 DIAGNOSIS — E038 Other specified hypothyroidism: Secondary | ICD-10-CM

## 2014-05-24 DIAGNOSIS — R931 Abnormal findings on diagnostic imaging of heart and coronary circulation: Secondary | ICD-10-CM

## 2014-05-24 DIAGNOSIS — R5383 Other fatigue: Secondary | ICD-10-CM

## 2014-05-24 DIAGNOSIS — R9389 Abnormal findings on diagnostic imaging of other specified body structures: Secondary | ICD-10-CM

## 2014-05-24 DIAGNOSIS — R5381 Other malaise: Secondary | ICD-10-CM

## 2014-05-24 DIAGNOSIS — I10 Essential (primary) hypertension: Secondary | ICD-10-CM

## 2014-05-24 DIAGNOSIS — Z0000 Encounter for general adult medical examination without abnormal findings: Secondary | ICD-10-CM

## 2014-05-24 DIAGNOSIS — E785 Hyperlipidemia, unspecified: Secondary | ICD-10-CM

## 2014-05-24 NOTE — Progress Notes (Signed)
Pre visit review using our clinic review tool, if applicable. No additional management support is needed unless otherwise documented below in the visit note.  Chief Complaint  Patient presents with  . Annual Exam    HPI: Patient comes in today for Preventive Health Care visit  Since last visit had gi bleeding and  Had colon polyp removed  BP: ok hasnt checked  In middle of moving LIPIDS on med no se Thyroid: no change Moving soon.   Does complain of fatigue  but know  Eating not as healthy and  Up all hours without reg sleep cause busy with tasks.  Does acupuncture for hand pain and massage for back issues doing ok except one tender area . Only using prilosec and ibu prn Health Maintenance  Topic Date Due  . Influenza Vaccine  05/06/2014  . Mammogram  08/13/2015  . Pap Smear  02/02/2016  . Tetanus/tdap  07/14/2021  . Colonoscopy  08/10/2023   Health Maintenance Review LIFESTYLE:  Exercise:   To do more  Tobacco/ETS:no Alcohol: per day  ocass Sugar beverages: limiting Sleep: erratic  Drug use: no  Colonoscopy: yes  ROS: still l has fatigue  Tingly feet  GEN/ HEENT: No fever, significant weight changes sweats headaches vision problems hearing changes, CV/ PULM; No chest pain shortness of breath cough, syncope,edema  change in exercise tolerance. GI /GU: No adominal pain, vomiting, change in bowel habits. No blood in the stool. No significant GU symptoms. SKIN/HEME: ,no acute skin rashes suspicious lesions or bleeding. No lymphadenopathy, nodules, masses.  NEURO/ PSYCH:  No neurologic signs such as weakness numbness. No depression anxiety. IMM/ Allergy: No unusual infections.  Allergy .   REST of 12 system review negative except as per HPI   Past Medical History  Diagnosis Date  . Postmenopausal   . SVT (supraventricular tachycardia)   . 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 Morton's neuroma   . Shoulder pain, left   . Breast pain     right  . Foot  pain     bilateral  . Tingling   . Paroxysmal atrial fibrillation     During pregnancy 2012, s/p DCCV x 2  . Skin cancer of trunk 02/14/2012  . History of PFTs 2014    nl  . H/O bone density study 2014    nl   . History of ETT     nl low exrcise capacity  . PAF (paroxysmal atrial fibrillation) 12/25/2011    During pregnancy 2012, s/p DCCV x 2     Family History  Problem Relation Age of Onset  . Hypertension    . Osteoporosis    . Arthritis    . Colon cancer Neg Hx   . Heart disease Father   . Breast cancer Maternal Aunt   . Diabetes Paternal Grandmother     History   Social History  . Marital Status: Married    Spouse Name: N/A    Number of Children: N/A  . Years of Education: N/A   Social History Main Topics  . Smoking status: Never Smoker   . Smokeless tobacco: Never Used  . Alcohol Use: Yes     Comment:  2 DRINKS A MONTH  . Drug Use: No  . Sexual Activity: Yes   Other Topics Concern  . 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 aspirin 81 MG tablet Take 81 mg by mouth daily.  . CRESTOR 20 MG tablet TAKE 1 TABLET BY MOUTH EVERY DAY  . ibuprofen (ADVIL,MOTRIN) 200 MG tablet Take 200 mg by mouth every 6 (six) hours as needed for pain.  Marland Kitchen labetalol (NORMODYNE) 200 MG tablet TAKE 1 TABLET BY MOUTH TWICE DAILY  . levothyroxine (SYNTHROID, LEVOTHROID) 25 MCG tablet TAKE 1 TABLET BY MOUTH DAILY  . omeprazole (PRILOSEC) 20 MG capsule Take 20 mg by mouth daily. OTC    EXAM:  BP 138/78  Temp(Src) 99 F (37.2 C) (Oral)  Ht 5\' 6"  (1.676 m)  Wt 233 lb (105.688 kg)  BMI 37.63 kg/m2  Body mass index is 37.63 kg/(m^2).  Physical Exam: Vital  signs reviewed OFB:PZWC is a well-developed well-nourished alert cooperative    who appearsr stated age in no acute distress.  HEENT: normocephalic atraumatic , Eyes: PERRL EOM's full, conjunctiva clear, Nares: paten,t no deformity discharge or tenderness., Ears: no deformity EAC's clear TMs with normal landmarks. Mouth: clear OP, no lesions, edema.  Moist mucous membranes. Dentition in adequate repair. NECK: supple without masses, thyromegaly or bruits. CHEST/PULM:  Clear to auscultation and percussion breath sounds equal no wheeze , rales or rhonchi. No chest wall deformities or tenderness. Breast: normal by inspection . No dimpling, discharge, masses, tenderness or discharge . CV: PMI is nondisplaced, S1 S2 no gallops, murmurs, rubs. Peripheral pulses are full without delay.No JVD .  ABDOMEN: Bowel sounds normal nontender  No guard or rebound, no hepato splenomegal no CVA tenderness.  No hernia. Extremtities:  No clubbing cyanosis or edema, no acute joint swelling or redness no focal atrophy NEURO:  Oriented x3, cranial nerves 3-12 appear to be intact, no obvious focal weakness,gait within normal limits no abnormal reflexes or asymmetrical SKIN: No acute rashes normal turgor, color, no bruising or petechiae. PSYCH: Oriented, good eye contact, no obvious depression anxiety, cognition and judgment appear normal. LN: no cervical axillary inguinal adenopathy  Lab Results  Component Value Date   WBC 6.3 05/17/2014   HGB 13.8 05/17/2014   HCT 41.7 05/17/2014   PLT 200.0 05/17/2014   GLUCOSE 95 05/17/2014   CHOL 140 05/17/2014   TRIG 113.0 05/17/2014   HDL 43.90 05/17/2014   LDLDIRECT 149.6 01/11/2010   LDLCALC 74 05/17/2014   ALT 17 05/17/2014   AST 22 05/17/2014   NA 141 05/17/2014   K 4.6 05/17/2014   CL 108 05/17/2014   CREATININE 0.8 05/17/2014   BUN 16 05/17/2014   CO2 27 05/17/2014   TSH 2.32 05/17/2014   INR 0.9 04/08/2008   HGBA1C 6.1 05/17/2014    ASSESSMENT AND PLAN:  Discussed the following  assessment and plan:  Visit for preventive health examination - utd   Other and unspecified hyperlipidemia  HYPERTENSION - follow  Agatston CAC score, >100 - 199 - cont crestor   Other specified hypothyroidism  Other malaise and fatigue - prob lifestyle related sleep etc   to work on this and if still an issue consdier other eval asks about cortisol level not inidcated today reeval if continue pr a1c over 6 follow   To  avoid diabetes   Will plan recheck in 6 months  Patient Care Team: Burnis Medin, MD as PCP - General Evans Lance, MD (Cardiology) Daria Pastures, MD as Attending Physician (Obstetrics and Gynecology) [facility] Myrtice Lauth, MD as Attending Physician (Dermatology) Griselda Miner, MD as Attending Physician (Dermatology) Viona Gilmore. Dwan Bolt D  Patient Instructions  Continue lifestyle intervention healthy eating and exercise .  Healthy lifestyle includes : At least 150 minutes of exercise weeks  , weight at healthy levels, which is usually   BMI 19-25. Avoid trans fats and processed foods;  Increase fresh fruits and veges to 5 servings per day. And avoid sweet beverages including tea and juice. Mediterranean diet with olive oil and nuts have been noted to be heart and brain healthy . Avoid tobacco products . Limit  alcohol to  7 per week for women and 14 servings for men.  Get adequate sleep .      Standley Brooking. Kiela Shisler M.D.

## 2014-05-24 NOTE — Telephone Encounter (Signed)
Relevant patient education assigned to patient using Emmi. ° °

## 2014-05-24 NOTE — Patient Instructions (Signed)
Continue lifestyle intervention healthy eating and exercise .  Healthy lifestyle includes : At least 150 minutes of exercise weeks  , weight at healthy levels, which is usually   BMI 19-25. Avoid trans fats and processed foods;  Increase fresh fruits and veges to 5 servings per day. And avoid sweet beverages including tea and juice. Mediterranean diet with olive oil and nuts have been noted to be heart and brain healthy . Avoid tobacco products . Limit  alcohol to  7 per week for women and 14 servings for men.  Get adequate sleep .

## 2014-05-25 MED ORDER — LABETALOL HCL 200 MG PO TABS: ORAL_TABLET | ORAL | Status: DC

## 2014-05-25 MED ORDER — LEVOTHYROXINE SODIUM 25 MCG PO TABS: ORAL_TABLET | ORAL | Status: DC

## 2014-05-25 MED ORDER — ROSUVASTATIN CALCIUM 20 MG PO TABS: ORAL_TABLET | ORAL | Status: DC

## 2014-06-01 ENCOUNTER — Other Ambulatory Visit: Payer: Self-pay | Admitting: Obstetrics and Gynecology

## 2014-06-02 LAB — CYTOLOGY - PAP

## 2014-07-21 ENCOUNTER — Other Ambulatory Visit: Payer: Self-pay

## 2014-08-07 ENCOUNTER — Encounter: Payer: Self-pay | Admitting: Internal Medicine

## 2014-08-23 ENCOUNTER — Other Ambulatory Visit: Payer: Self-pay

## 2014-08-23 DIAGNOSIS — Z1231 Encounter for screening mammogram for malignant neoplasm of breast: Secondary | ICD-10-CM

## 2014-09-14 ENCOUNTER — Encounter (HOSPITAL_COMMUNITY): Payer: Self-pay | Admitting: Internal Medicine

## 2014-09-15 ENCOUNTER — Ambulatory Visit
Admission: RE | Admit: 2014-09-15 | Discharge: 2014-09-15 | Disposition: A | Discharge status: Home / Self Care | Payer: BC Managed Care – PPO | Source: Ambulatory Visit

## 2014-09-15 DIAGNOSIS — Z1231 Encounter for screening mammogram for malignant neoplasm of breast: Secondary | ICD-10-CM

## 2014-11-14 ENCOUNTER — Other Ambulatory Visit: Payer: Self-pay | Admitting: Dermatology

## 2014-12-27 ENCOUNTER — Ambulatory Visit (HOSPITAL_COMMUNITY): Payer: BLUE CROSS/BLUE SHIELD | Attending: Cardiology | Admitting: Cardiology

## 2014-12-27 DIAGNOSIS — M79661 Pain in right lower leg: Secondary | ICD-10-CM | POA: Diagnosis present

## 2014-12-27 DIAGNOSIS — M79604 Pain in right leg: Secondary | ICD-10-CM

## 2014-12-27 DIAGNOSIS — M7989 Other specified soft tissue disorders: Secondary | ICD-10-CM | POA: Diagnosis not present

## 2014-12-27 NOTE — Progress Notes (Signed)
Right lower venous duplex performed

## 2014-12-29 ENCOUNTER — Other Ambulatory Visit: Payer: Self-pay

## 2014-12-29 ENCOUNTER — Encounter (HOSPITAL_COMMUNITY)
Admission: RE | Admit: 2014-12-29 | Discharge: 2014-12-29 | Disposition: A | Discharge status: Home / Self Care | Payer: BLUE CROSS/BLUE SHIELD | Source: Ambulatory Visit | Attending: Obstetrics and Gynecology | Admitting: Obstetrics and Gynecology

## 2014-12-29 ENCOUNTER — Encounter (HOSPITAL_COMMUNITY): Payer: Self-pay

## 2014-12-29 DIAGNOSIS — Z01810 Encounter for preprocedural cardiovascular examination: Secondary | ICD-10-CM | POA: Diagnosis not present

## 2014-12-29 DIAGNOSIS — Z01812 Encounter for preprocedural laboratory examination: Secondary | ICD-10-CM | POA: Diagnosis present

## 2014-12-29 LAB — BASIC METABOLIC PANEL
Anion gap: 6 (ref 5–15)
BUN: 18 mg/dL (ref 6–23)
CO2: 26 mmol/L (ref 19–32)
Calcium: 9.4 mg/dL (ref 8.4–10.5)
Chloride: 108 mmol/L (ref 96–112)
Creatinine, Ser: 0.91 mg/dL (ref 0.50–1.10)
GFR calc Af Amer: 79 mL/min — ABNORMAL LOW (ref >90)
GFR calc non Af Amer: 68 mL/min — ABNORMAL LOW (ref >90)
Glucose, Bld: 96 mg/dL (ref 70–99)
Potassium: 4.5 mmol/L (ref 3.5–5.1)
Sodium: 140 mmol/L (ref 135–145)

## 2014-12-29 LAB — CBC
HCT: 44.3 % (ref 36.0–46.0)
Hemoglobin: 14.2 g/dL (ref 12.0–15.0)
MCH: 29.1 pg (ref 26.0–34.0)
MCHC: 32.1 g/dL (ref 30.0–36.0)
MCV: 90.8 fL (ref 78.0–100.0)
Platelets: 202 10*3/uL (ref 150–400)
RBC: 4.88 MIL/uL (ref 3.87–5.11)
RDW: 14.1 % (ref 11.5–15.5)
WBC: 6 10*3/uL (ref 4.0–10.5)

## 2014-12-29 NOTE — Patient Instructions (Addendum)
   Your procedure is scheduled on: April 13 AT 1045AM  Enter through the Main Entrance of [facility] at:915AM  Randall up the phone at the desk and dial [phone] and inform us of your arrival.  Please call this number if you have any problems the morning of surgery: [phone]  Remember: Do not eat food after midnight: April 12 Do not drink clear liquids after: 630AM ON April 13 Take these medicines the morning of surgery with a SIP OF WATER:  TAKE AM MEDICATIONS AS YOUR NORMALLY TAKE   Do not wear jewelry, make-up, or FINGER nail polish No metal in your hair or on your body. Do not wear lotions, powders, perfumes.  You may wear deodorant.  Do not bring valuables to the hospital. Contacts, dentures or bridgework may not be worn into surgery.    Patients discharged on the day of surgery will not be allowed to drive home.

## 2015-01-16 ENCOUNTER — Other Ambulatory Visit: Payer: Self-pay | Admitting: Obstetrics and Gynecology

## 2015-01-16 NOTE — Anesthesia Preprocedure Evaluation (Addendum)
Anesthesia Evaluation  Patient identified by MRN, date of birth, ID band Patient awake    Reviewed: Allergy & Precautions, NPO status , Patient's Chart, lab work & pertinent test results, reviewed documented beta blocker date and time   History of Anesthesia Complications (+) MALIGNANT HYPERTHERMIA  Airway Mallampati: II   Neck ROM: Full    Dental  (+) Teeth Intact, Dental Advisory Given   Pulmonary  breath sounds clear to auscultation        Cardiovascular hypertension, Pt. on medications Rhythm:Regular  ECHO 2013 EF 60%,  trivial MR, EKG 12/29/14 NSR, HX of PAT & AF in past   Neuro/Psych    GI/Hepatic Neg liver ROS, GERD-  ,  Endo/Other  Hypothyroidism Morbid obesity (BMI 36)  Renal/GU negative Renal ROS     Musculoskeletal   Abdominal (+)  Abdomen: soft.    Peds  Hematology   Anesthesia Other Findings   Reproductive/Obstetrics                            Anesthesia Physical Anesthesia Plan  ASA: II  Anesthesia Plan: General   Post-op Pain Management:    Induction: Intravenous and Rapid sequence  Airway Management Planned: Oral ETT  Additional Equipment:   Intra-op Plan:   Post-operative Plan: Extubation in OR  Informed Consent: I have reviewed the patients History and Physical, chart, labs and discussed the procedure including the risks, benefits and alternatives for the proposed anesthesia with the patient or authorized representative who has indicated his/her understanding and acceptance.     Plan Discussed with:   Anesthesia Plan Comments: (Multimodal pain RX)        Anesthesia Quick Evaluation

## 2015-01-16 NOTE — H&P (Signed)
59 y.o. yo complains of post menopausal bleeding.  Pt had uterine polyps removed postmenopausal several years ago before she underwent two rounds of IVF and pregnancies.  Pt. states she started bleeding 11/30/14 bright red at visit dark previously. Her US showed 5x3x4; EM 0.92 -  Now possible polyp again. LO 1.26 cm; RO not seen. NAD. Month or two before visit she had a small amount spotting. She had recent physical.  Past Medical History  Diagnosis Date  . Postmenopausal   . SVT (supraventricular tachycardia)   . 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 Morton's neuroma   . Shoulder pain, left   . Breast pain     right  . Foot pain     bilateral  . Tingling   . Paroxysmal atrial fibrillation     During pregnancy 02-01-11, s/p DCCV x 2  . Skin cancer of trunk 02/14/2012  . History of PFTs Jan 31, 2013    nl  . H/O bone density study 2013-01-31    nl   . History of ETT     nl low exrcise capacity  . PAF (paroxysmal atrial fibrillation) 12/25/2011    During pregnancy 02-01-2011, s/p DCCV x 2    Past Surgical History  Procedure Laterality Date  . Cesarean section  10/10  . Polypectomy  9/09    Uterine  . Wisdom tooth extraction  8/76  . Pars plana vitrectomy w/ repair of macular hole  12/09  . Cataract extraction  2/10  . Cesarean section  07/14/2011    Procedure: CESAREAN SECTION;  Surgeon: Daria Pastures;  Location: [facility];  Service: Gynecology;  Laterality: N/A;  Repeat  . Cardiac catheterization  12/24/11    cardiac ablation  . Macular surgery    . Supraventricular tachycardia ablation N/A 12/24/2011    Procedure: SUPRAVENTRICULAR TACHYCARDIA ABLATION;  Surgeon: Evans Lance, MD;  Location: [facility];  Service: Cardiovascular;  Laterality: N/A;    History   Social History  . Marital Status: Married    Spouse Name: N/A  . Number of Children: N/A  . Years of Education: N/A   Occupational History  . Not on file.   Social History Main Topics  . Smoking status: Never Smoker    . Smokeless tobacco: Never Used  . Alcohol Use: Yes     Comment:  2 DRINKS A MONTH  . Drug Use: No  . Sexual Activity: Yes   Other Topics Concern  . 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   Current Outpatient Prescriptions on File Prior to Encounter  Medication Sig Dispense Refill  . aspirin 81 MG tablet Take 81 mg by mouth daily.    Marland Kitchen ibuprofen (ADVIL,MOTRIN) 200 MG tablet Take 200 mg by mouth every 6 (six) hours as needed for pain.    Marland Kitchen labetalol (NORMODYNE) 200 MG tablet TAKE 1 TABLET BY MOUTH TWICE DAILY 180 tablet 3  . levothyroxine (SYNTHROID, LEVOTHROID) 25 MCG tablet TAKE 1 TABLET BY MOUTH DAILY 90 tablet 3  .  omeprazole (PRILOSEC) 20 MG capsule Take 20 mg by mouth daily. OTC    . rosuvastatin (CRESTOR) 20 MG tablet TAKE 1 TABLET BY MOUTH EVERY DAY 90 tablet 3    Allergies  Allergen Reactions  . Latex Itching    @VITALS2 @  Lungs: clear to ascultation Cor:  RRR Abdomen:  soft, nontender, nondistended. Ex:  no cords, erythema Pelvic:  Vulva: no masses, atrophy, or lesions. Vagina: no tenderness, erythema, cystocele, rectocele, abnormal vaginal discharge, or vesicle(s) or ulcers. Cervix: no discharge or cervical motion tenderness and grossly normal.  Uterus: normal shape and size (7) and midline, non-tender, and no uterine prolapse. Bladder/Urethra: no urethral discharge or mass and normal meatus, bladder non distended, and Urethra well supported. Adnexa/Parametria: no parametrial tenderness or mass and no adnexal tenderness or ovarian mass.  A:  Post menopausal bleeding, first episode since pregnancies.  US shows possible endometrial polyp.   Pt offered several options but pt desires both diagnostic and therapeutic.  For dilation, curettage and hysteroscopy with possible polyp removal.     P: All risks, benefits and alternatives d/w patient and she desires to proceed.  Patient will have SCDs during the operation.     Sumiko Ceasar A

## 2015-01-17 ENCOUNTER — Encounter (HOSPITAL_COMMUNITY)
Admission: RE | Disposition: A | Discharge status: Home / Self Care | Payer: Self-pay | Source: Ambulatory Visit | Attending: Obstetrics and Gynecology

## 2015-01-17 ENCOUNTER — Ambulatory Visit (HOSPITAL_COMMUNITY)
Admission: RE | Admit: 2015-01-17 | Discharge: 2015-01-17 | Disposition: A | Discharge status: Home / Self Care | Payer: BLUE CROSS/BLUE SHIELD | Source: Ambulatory Visit | Attending: Obstetrics and Gynecology | Admitting: Obstetrics and Gynecology

## 2015-01-17 ENCOUNTER — Ambulatory Visit (HOSPITAL_COMMUNITY): Payer: BLUE CROSS/BLUE SHIELD | Admitting: Anesthesiology

## 2015-01-17 DIAGNOSIS — Z85828 Personal history of other malignant neoplasm of skin: Secondary | ICD-10-CM | POA: Insufficient documentation

## 2015-01-17 DIAGNOSIS — Z6836 Body mass index (BMI) 36.0-36.9, adult: Secondary | ICD-10-CM | POA: Diagnosis not present

## 2015-01-17 DIAGNOSIS — N950 Postmenopausal bleeding: Secondary | ICD-10-CM | POA: Diagnosis present

## 2015-01-17 DIAGNOSIS — I480 Paroxysmal atrial fibrillation: Secondary | ICD-10-CM | POA: Insufficient documentation

## 2015-01-17 DIAGNOSIS — E039 Hypothyroidism, unspecified: Secondary | ICD-10-CM | POA: Diagnosis not present

## 2015-01-17 DIAGNOSIS — Z91040 Latex allergy status: Secondary | ICD-10-CM | POA: Diagnosis not present

## 2015-01-17 DIAGNOSIS — Z7982 Long term (current) use of aspirin: Secondary | ICD-10-CM | POA: Insufficient documentation

## 2015-01-17 DIAGNOSIS — I10 Essential (primary) hypertension: Secondary | ICD-10-CM | POA: Diagnosis not present

## 2015-01-17 DIAGNOSIS — K219 Gastro-esophageal reflux disease without esophagitis: Secondary | ICD-10-CM | POA: Diagnosis not present

## 2015-01-17 HISTORY — PX: HYSTEROSCOPY WITH D & C: SHX1775

## 2015-01-17 LAB — PREGNANCY, URINE: Preg Test, Ur: NEGATIVE

## 2015-01-17 SURGERY — DILATATION AND CURETTAGE /HYSTEROSCOPY
Anesthesia: General | Site: Vagina

## 2015-01-17 MED ORDER — PROMETHAZINE HCL 25 MG/ML IJ SOLN: 6.2500 mg | INTRAMUSCULAR | Status: DC | PRN

## 2015-01-17 MED ORDER — LIDOCAINE HCL (CARDIAC) 20 MG/ML IV SOLN
INTRAVENOUS | Status: AC
  Filled 2015-01-17: qty 5

## 2015-01-17 MED ORDER — DEXAMETHASONE SODIUM PHOSPHATE 4 MG/ML IJ SOLN
INTRAMUSCULAR | Status: AC
  Filled 2015-01-17: qty 1

## 2015-01-17 MED ORDER — DEXAMETHASONE SODIUM PHOSPHATE 10 MG/ML IJ SOLN
INTRAMUSCULAR | Status: DC | PRN
  Administered 2015-01-17: 4 mg via INTRAVENOUS

## 2015-01-17 MED ORDER — FENTANYL CITRATE 0.05 MG/ML IJ SOLN
25.0000 ug | INTRAMUSCULAR | Status: DC | PRN
  Administered 2015-01-17 (×2): 50 ug via INTRAVENOUS

## 2015-01-17 MED ORDER — LACTATED RINGERS IV SOLN
INTRAVENOUS | Status: DC
  Administered 2015-01-17: 09:00:00 via INTRAVENOUS

## 2015-01-17 MED ORDER — SCOPOLAMINE 1 MG/3DAYS TD PT72
MEDICATED_PATCH | TRANSDERMAL | Status: AC
  Administered 2015-01-17: 1.5 mg via TRANSDERMAL
  Filled 2015-01-17: qty 1

## 2015-01-17 MED ORDER — ONDANSETRON HCL 4 MG/2ML IJ SOLN
INTRAMUSCULAR | Status: DC | PRN
  Administered 2015-01-17: 4 mg via INTRAVENOUS

## 2015-01-17 MED ORDER — FENTANYL CITRATE 0.05 MG/ML IJ SOLN
INTRAMUSCULAR | Status: DC | PRN
  Administered 2015-01-17: 50 ug via INTRAVENOUS
  Administered 2015-01-17: 25 ug via INTRAVENOUS
  Administered 2015-01-17: 100 ug via INTRAVENOUS

## 2015-01-17 MED ORDER — PROPOFOL 10 MG/ML IV BOLUS
INTRAVENOUS | Status: AC
  Filled 2015-01-17: qty 20

## 2015-01-17 MED ORDER — FENTANYL CITRATE 0.05 MG/ML IJ SOLN
INTRAMUSCULAR | Status: AC
  Filled 2015-01-17: qty 2

## 2015-01-17 MED ORDER — SCOPOLAMINE 1 MG/3DAYS TD PT72
1.0000 | MEDICATED_PATCH | Freq: Once | TRANSDERMAL | Status: DC
  Administered 2015-01-17: 1.5 mg via TRANSDERMAL

## 2015-01-17 MED ORDER — LIDOCAINE HCL (CARDIAC) 20 MG/ML IV SOLN
INTRAVENOUS | Status: DC | PRN
  Administered 2015-01-17: 80 mg via INTRAVENOUS

## 2015-01-17 MED ORDER — MEPERIDINE HCL 25 MG/ML IJ SOLN: 6.2500 mg | INTRAMUSCULAR | Status: DC | PRN

## 2015-01-17 MED ORDER — MIDAZOLAM HCL 2 MG/2ML IJ SOLN
INTRAMUSCULAR | Status: AC
  Filled 2015-01-17: qty 2

## 2015-01-17 MED ORDER — KETOROLAC TROMETHAMINE 30 MG/ML IJ SOLN
INTRAMUSCULAR | Status: AC
  Filled 2015-01-17: qty 1

## 2015-01-17 MED ORDER — FENTANYL CITRATE 0.05 MG/ML IJ SOLN
INTRAMUSCULAR | Status: AC
  Administered 2015-01-17: 50 ug via INTRAVENOUS
  Filled 2015-01-17: qty 2

## 2015-01-17 MED ORDER — KETOROLAC TROMETHAMINE 30 MG/ML IJ SOLN
INTRAMUSCULAR | Status: DC | PRN
  Administered 2015-01-17: 30 mg via INTRAVENOUS

## 2015-01-17 MED ORDER — PROPOFOL 10 MG/ML IV BOLUS
INTRAVENOUS | Status: DC | PRN
  Administered 2015-01-17: 200 mg via INTRAVENOUS

## 2015-01-17 MED ORDER — ONDANSETRON HCL 4 MG/2ML IJ SOLN
INTRAMUSCULAR | Status: AC
  Filled 2015-01-17: qty 2

## 2015-01-17 SURGICAL SUPPLY — 19 items
ABLATOR ENDOMETRIAL BIPOLAR (ABLATOR) IMPLANT
CANISTER SUCT 3000ML (MISCELLANEOUS) ×2 IMPLANT
CATH ROBINSON RED A/P 16FR (CATHETERS) ×2 IMPLANT
CATH SILICONE 16FRX5CC (CATHETERS) ×1 IMPLANT
CLOTH BEACON ORANGE TIMEOUT ST (SAFETY) ×2 IMPLANT
CONTAINER PREFILL 10% NBF 60ML (FORM) ×4 IMPLANT
ELECT REM PT RETURN 9FT ADLT (ELECTROSURGICAL)
ELECTRODE REM PT RTRN 9FT ADLT (ELECTROSURGICAL) IMPLANT
GAUZE SPONGE 4X4 16PLY XRAY LF (GAUZE/BANDAGES/DRESSINGS) ×1 IMPLANT
GLOVE BIO SURGEON STRL SZ7 (GLOVE) ×2 IMPLANT
GOWN STRL REUS W/TWL LRG LVL3 (GOWN DISPOSABLE) ×4 IMPLANT
LOOP ANGLED CUTTING 22FR (CUTTING LOOP) IMPLANT
PACK VAGINAL MINOR WOMEN LF (CUSTOM PROCEDURE TRAY) ×2 IMPLANT
PAD OB MATERNITY 4.3X12.25 (PERSONAL CARE ITEMS) ×2 IMPLANT
SUT VIC AB 2-0 UR6 27 (SUTURE) ×1 IMPLANT
TOWEL OR 17X24 6PK STRL BLUE (TOWEL DISPOSABLE) ×4 IMPLANT
TUBING AQUILEX INFLOW (TUBING) ×2 IMPLANT
TUBING AQUILEX OUTFLOW (TUBING) ×2 IMPLANT
WATER STERILE IRR 1000ML POUR (IV SOLUTION) ×2 IMPLANT

## 2015-01-17 NOTE — Anesthesia Postprocedure Evaluation (Signed)
  Anesthesia Post-op Note  Patient: Alison Matthews  Procedure(s) Performed: Procedure(s): DILATATION AND CURETTAGE /HYSTEROSCOPY/POLYPECTOMY (N/A)  Patient Location: PACU  Anesthesia Type:General  Level of Consciousness: awake and alert   Airway and Oxygen Therapy: Patient Spontanous Breathing and Patient connected to nasal cannula oxygen  Post-op Pain: mild  Post-op Assessment: Post-op Vital signs reviewed and Patient's Cardiovascular Status Stable  Post-op Vital Signs: Reviewed and stable  Last Vitals:  Filed Vitals:   01/17/15 1116  BP: 118/60  Pulse: 68  Temp: 36.4 C  Resp: 16    Complications: No apparent anesthesia complications

## 2015-01-17 NOTE — Anesthesia Procedure Notes (Signed)
Procedure Name: LMA Insertion Date/Time: 01/17/2015 10:42 AM Performed by: Hewitt Blade Pre-anesthesia Checklist: Emergency Drugs available, Suction available, Patient being monitored and Patient identified Patient Re-evaluated:Patient Re-evaluated prior to inductionOxygen Delivery Method: Circle system utilized Preoxygenation: Pre-oxygenation with 100% oxygen Intubation Type: IV induction LMA: LMA inserted LMA Size: 4.0 Number of attempts: 1 Placement Confirmation: positive ETCO2 Tube secured with: Tape Dental Injury: Teeth and Oropharynx as per pre-operative assessment

## 2015-01-17 NOTE — Brief Op Note (Signed)
01/17/2015  11:07 AM  PATIENT:  Alison Matthews  59 y.o. female  PRE-OPERATIVE DIAGNOSIS:  postmenopausal UTERINE BLEEDING  POST-OPERATIVE DIAGNOSIS:  same  PROCEDURE:  Procedure(s): DILATATION AND CURETTAGE /HYSTEROSCOPY (N/A)  SURGEON:  Surgeon(s) and Role:    * Bobbye Charleston, MD - Primary  ANESTHESIA:   general  EBL:  Total I/O In: -  Out: 300 [Urine:300]   SPECIMEN:  Source of Specimen:  uterine polyp and curretings  DISPOSITION OF SPECIMEN:  PATHOLOGY  COUNTS:  YES  TOURNIQUET:  * No tourniquets in log *  DICTATION: .Note written in EPIC  PLAN OF CARE: Discharge to home after PACU  PATIENT DISPOSITION:  PACU - hemodynamically stable.   Delay start of Pharmacological VTE agent (>24hrs) due to surgical blood loss or risk of bleeding: not applicable  Complications: small laceration of cervix requiring two stitches for hemostasis.    EBL: 50cc  Hysteroscopic deficit: 300cc, no perforation noted.  After adequate anesthesia was achieved, the patient was prepped and draped in the usual sterile fashion.  The speculum was placed in the vagina and the cervix stabilized with a single-tooth tenaculum.  The cervix was dilated with pratt dilators and the hysteroscope passed inside the endometrial cavity.  The above findings were noted and polyp forceps were used to remove small pieces of tissue.  Sharp curettage was then performed and uterine curettings sent to path.  All instruments were removed from the vagina.  The patient tolerated the procedure well.    Charyl Minervini A

## 2015-01-17 NOTE — Op Note (Signed)
01/17/2015  11:07 AM  PATIENT:  [name]  59 y.o. female  PRE-OPERATIVE DIAGNOSIS:  postmenopausal UTERINE BLEEDING  POST-OPERATIVE DIAGNOSIS:  same  PROCEDURE:  Procedure(s): DILATATION AND CURETTAGE /HYSTEROSCOPY (N/Matthews)  SURGEON:  Surgeon(s) and Role:    * Bobbye Charleston, MD - Primary  ANESTHESIA:   general  EBL:  Total I/O In: -  Out: 300 [Urine:300]   SPECIMEN:  Source of Specimen:  uterine polyp and curretings  DISPOSITION OF SPECIMEN:  PATHOLOGY  COUNTS:  YES  TOURNIQUET:  * No tourniquets in log *  DICTATION: .Note written in EPIC  PLAN OF CARE: Discharge to home after PACU  PATIENT DISPOSITION:  PACU - hemodynamically stable.   Delay start of Pharmacological VTE agent (>24hrs) due to surgical blood loss or risk of bleeding: not applicable  Complications: small laceration of cervix requiring two stitches for hemostasis.    EBL: 50cc  Hysteroscopic deficit: 300cc, no perforation noted.  After adequate anesthesia was achieved, the patient was prepped and draped in the usual sterile fashion.  The speculum was placed in the vagina and the cervix stabilized with Matthews single-tooth tenaculum.  The cervix was dilated with pratt dilators and the hysteroscope passed inside the endometrial cavity.  The above findings were noted and polyp forceps were used to remove small pieces of tissue.  Sharp curettage was then performed and uterine curettings sent to path.  All instruments were removed from the vagina.  The patient tolerated the procedure well.    Alison Matthews

## 2015-01-17 NOTE — Discharge Instructions (Signed)
DISCHARGE INSTRUCTIONS: HYSTEROSCOPY / ENDOMETRIAL ABLATION The following instructions have been prepared to help you care for yourself upon your return home.  May Remove Scop patch on or before  May take Ibuprofen after  May take stool softner while taking narcotic pain medication to prevent constipation.  Drink plenty of water.  Personal hygiene:  Use sanitary pads for vaginal drainage, not tampons.  Shower the day after your procedure.  NO tub baths, pools or Jacuzzis for 2-3 weeks.  Wipe front to back after using the bathroom.  Activity and limitations:  Do NOT drive or operate any equipment for 24 hours. The effects of anesthesia are still present and drowsiness may result.  Do NOT rest in bed all day.  Walking is encouraged.  Walk up and down stairs slowly.  You may resume your normal activity in one to two days or as indicated by your physician. Sexual activity: NO intercourse for at least 2 weeks after the procedure, or as indicated by your Doctor.  Diet: Eat a light meal as desired this evening. You may resume your usual diet tomorrow.  Return to Work: You may resume your work activities in one to two days or as indicated by Marine scientist.  What to expect after your surgery: Expect to have vaginal bleeding/discharge for 2-3 days and spotting for up to 10 days. It is not unusual to have soreness for up to 1-2 weeks. You may have a slight burning sensation when you urinate for the first day. Mild cramps may continue for a couple of days. You may have a regular period in 2-6 weeks.  NO IBUPROFEN PRODUCTS (MOTRIN, ADVIL) OR ALEVE UNTIL 5:00PM TODAY.   Call your doctor for any of the following:  Excessive vaginal bleeding or clotting, saturating and changing one pad every hour.  Inability to urinate 6 hours after discharge from hospital.  Pain not relieved by pain medication.  Fever of 100.4 F or greater.  Unusual vaginal discharge or odor.  Return to  office _________________Call for an appointment ___________________ Patients signature: ______________________ Nurses signature ________________________  [facility] [address] [phone]

## 2015-01-17 NOTE — Transfer of Care (Signed)
Immediate Anesthesia Transfer of Care Note  Patient: Alison Matthews  Procedure(s) Performed: Procedure(s): DILATATION AND CURETTAGE /HYSTEROSCOPY/POLYPECTOMY (N/A)  Patient Location: PACU  Anesthesia Type:General  Level of Consciousness: awake, alert  and oriented  Airway & Oxygen Therapy: Patient Spontanous Breathing and Patient connected to nasal cannula oxygen  Post-op Assessment: Report given to RN, Post -op Vital signs reviewed and stable and Patient moving all extremities  Post vital signs: Reviewed and stable  Last Vitals:  Filed Vitals:   01/17/15 0849  BP: 140/63  Pulse: 68  Resp: 20    Complications: No apparent anesthesia complications

## 2015-01-17 NOTE — Progress Notes (Signed)
There has been no change in the patients history, status or exam since the history and physical.  There were no vitals filed for this visit.  Lab Results  Component Value Date   WBC 6.0 12/29/2014   HGB 14.2 12/29/2014   HCT 44.3 12/29/2014   MCV 90.8 12/29/2014   PLT 202 12/29/2014    Alison Matthews

## 2015-01-18 ENCOUNTER — Encounter (HOSPITAL_COMMUNITY): Payer: Self-pay | Admitting: Obstetrics and Gynecology

## 2015-02-07 ENCOUNTER — Ambulatory Visit (INDEPENDENT_AMBULATORY_CARE_PROVIDER_SITE_OTHER): Payer: BLUE CROSS/BLUE SHIELD | Admitting: Family Medicine

## 2015-02-07 ENCOUNTER — Encounter: Payer: Self-pay | Admitting: Family Medicine

## 2015-02-07 VITALS — BP 102/70 | HR 86 | Temp 98.3°F | Ht 67.0 in | Wt 223.3 lb

## 2015-02-07 DIAGNOSIS — J029 Acute pharyngitis, unspecified: Secondary | ICD-10-CM | POA: Diagnosis not present

## 2015-02-07 LAB — POCT RAPID STREP A (OFFICE): Rapid Strep A Screen: NEGATIVE

## 2015-02-07 NOTE — Progress Notes (Signed)
Pre visit review using our clinic review tool, if applicable. No additional management support is needed unless otherwise documented below in the visit note. 

## 2015-02-07 NOTE — Addendum Note (Signed)
Addended by: Agnes Lawrence on: 02/07/2015 10:07 AM   Modules accepted: Orders

## 2015-02-07 NOTE — Progress Notes (Signed)
HPI:  Sore throat -started: last night -symptoms:sore throat, PND, ears stopped up -denies:fever, SOB, NVD, tooth pain, HA -has tried: none -sick contacts/travel/risks: daughter dx with strep yesterday, also flu like illness going around, denies flu exposure, tick exposure or or Ebola risks  ROS: See pertinent positives and negatives per HPI.  Past Medical History  Diagnosis Date  . Postmenopausal   . SVT (supraventricular tachycardia)   . 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 Morton's neuroma   . Shoulder pain, left   . Breast pain     right  . Foot pain     bilateral  . Tingling   . Paroxysmal atrial fibrillation     During pregnancy Jan 25, 2011, s/p DCCV x 2  . Skin cancer of trunk 02/14/2012  . History of PFTs 2013-01-24    nl  . H/O bone density study 01/24/2013    nl   . History of ETT     nl low exrcise capacity  . PAF (paroxysmal atrial fibrillation) 12/25/2011    During pregnancy 01/25/2011, s/p DCCV x 2     Past Surgical History  Procedure Laterality Date  . Cesarean section  10/10  . Polypectomy  9/09    Uterine  . Wisdom tooth extraction  8/76  . Pars plana vitrectomy w/ repair of macular hole  12/09  . Cataract extraction  2/10  . Cesarean section  07/14/2011    Procedure: CESAREAN SECTION;  Surgeon: Daria Pastures;  Location: [facility];  Service: Gynecology;  Laterality: N/A;  Repeat  . Cardiac catheterization  12/24/11    cardiac ablation  . Macular surgery    . Supraventricular tachycardia ablation N/A 12/24/2011    Procedure: SUPRAVENTRICULAR TACHYCARDIA ABLATION;  Surgeon: Evans Lance, MD;  Location: [facility] CATH LAB;  Service: Cardiovascular;  Laterality: N/A;  . Hysteroscopy w/d&c N/A 01/17/2015    Procedure: DILATATION AND CURETTAGE /HYSTEROSCOPY/POLYPECTOMY;  Surgeon: Bobbye Charleston, MD;  Location: [facility];  Service: Gynecology;  Laterality: N/A;    Family History  Problem Relation Age of Onset  . Hypertension    . Osteoporosis    . Arthritis    .  Colon cancer Neg Hx   . Heart disease Father   . Breast cancer Maternal Aunt   . Diabetes Paternal Grandmother     History   Social History  . Marital Status: Married    Spouse Name: N/A  . Number of Children: N/A  . Years of Education: N/A   Social History Main Topics  . Smoking status: Never Smoker   . Smokeless tobacco: Never Used  . Alcohol Use: Yes     Comment:  2 DRINKS A MONTH  . Drug Use: No  . Sexual Activity: Yes   Other Topics Concern  . 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  aspirin 81 MG tablet, Take 81 mg by mouth daily., Disp: , Rfl:  .  ibuprofen (ADVIL,MOTRIN) 200 MG tablet, Take 200 mg by mouth every 6 (six) hours as needed for pain., Disp: ,  Rfl:  .  labetalol (NORMODYNE) 200 MG tablet, TAKE 1 TABLET BY MOUTH TWICE DAILY, Disp: 180 tablet, Rfl: 3 .  levothyroxine (SYNTHROID, LEVOTHROID) 25 MCG tablet, TAKE 1 TABLET BY MOUTH DAILY, Disp: 90 tablet, Rfl: 3 .  omeprazole (PRILOSEC) 20 MG capsule, Take 20 mg by mouth daily. OTC, Disp: , Rfl:  .  rosuvastatin (CRESTOR) 20 MG tablet, TAKE 1 TABLET BY MOUTH EVERY DAY, Disp: 90 tablet, Rfl: 3  EXAM:  Filed Vitals:   02/07/15 0918  BP: 102/70  Pulse: 86  Temp: 98.3 F (36.8 C)    Body mass index is 34.97 kg/(m^2).  GENERAL: vitals reviewed and listed above, alert, oriented, appears well hydrated and in no acute distress  HEENT: atraumatic, conjunttiva clear, no obvious abnormalities on inspection of external nose and ears, normal appearance of ear canals and TMs, clear nasal congestion, mild post oropharyngeal erythema with PND, no tonsillar edema or exudate, no sinus TTP  NECK: no obvious masses on  inspection  LUNGS: clear to auscultation bilaterally, no wheezes, rales or rhonchi, good air movement  CV: HRRR, no peripheral edema  MS: moves all extremities without noticeable abnormality  PSYCH: pleasant and cooperative, no obvious depression or anxiety  ASSESSMENT AND PLAN:  Discussed the following assessment and plan:  Sore throat  -no signs of strep infection on exam today but given exposure and her concern opted for strep culture and tx if positive, rapid neg -of course, we advised to return or notify a doctor immediately if symptoms worsen or persist or new concerns arise.    There are no Patient Instructions on file for this visit.   Colin Benton R.

## 2015-02-07 NOTE — Patient Instructions (Signed)
-  We have ordered labs or studies at this visit. It can take up to 1-2 weeks for results and processing. We will contact you with instructions IF your results are abnormal. Normal results will be released to your [facility]. If you have not heard from [place] or can not find your results in [facility] in 2 weeks please contact our office.

## 2015-02-08 ENCOUNTER — Ambulatory Visit: Payer: BLUE CROSS/BLUE SHIELD | Admitting: Family Medicine

## 2015-02-09 LAB — CULTURE, GROUP A STREP: Organism ID, Bacteria: NORMAL

## 2015-06-21 ENCOUNTER — Other Ambulatory Visit: Payer: Self-pay | Admitting: Internal Medicine

## 2015-09-19 ENCOUNTER — Other Ambulatory Visit: Payer: Self-pay

## 2015-09-19 DIAGNOSIS — Z1231 Encounter for screening mammogram for malignant neoplasm of breast: Secondary | ICD-10-CM

## 2015-10-26 ENCOUNTER — Ambulatory Visit: Payer: BLUE CROSS/BLUE SHIELD

## 2015-11-09 ENCOUNTER — Ambulatory Visit
Admission: RE | Admit: 2015-11-09 | Discharge: 2015-11-09 | Disposition: A | Discharge status: Home / Self Care | Payer: BLUE CROSS/BLUE SHIELD | Source: Ambulatory Visit

## 2015-11-09 DIAGNOSIS — Z1231 Encounter for screening mammogram for malignant neoplasm of breast: Secondary | ICD-10-CM

## 2016-02-28 ENCOUNTER — Telehealth: Payer: Self-pay | Admitting: Internal Medicine

## 2016-02-28 NOTE — Telephone Encounter (Addendum)
Pt states she is going out of town on July 1 for a month.  Pt has not seen you or had any labs since 05/2014. Her meds are running out now and she wants to know if you will see her and do lab work prior before she leave on  July 1.  Pt would like a well visit in June if possible. Cannot do wed the 14th. Please advise if ok to work in last week in June?

## 2016-03-05 NOTE — Telephone Encounter (Signed)
Left message on voice mail  to call back

## 2016-03-05 NOTE — Telephone Encounter (Signed)
Pt has been scheduled.  °

## 2016-03-05 NOTE — Telephone Encounter (Signed)
Sure   We can do this as a cpx and cpx labs and hg a1c  Or   OV with labs ahead of time to include cbcdiff bmp lipid tsh and lfts and hga1c

## 2016-03-09 ENCOUNTER — Other Ambulatory Visit: Payer: Self-pay | Admitting: Internal Medicine

## 2016-03-12 ENCOUNTER — Other Ambulatory Visit: Payer: Self-pay | Admitting: *Deleted

## 2016-03-12 MED ORDER — LEVOTHYROXINE SODIUM 25 MCG PO TABS: 25.0000 ug | ORAL_TABLET | Freq: Every day | ORAL | Status: DC

## 2016-03-12 MED ORDER — ROSUVASTATIN CALCIUM 20 MG PO TABS: 20.0000 mg | ORAL_TABLET | Freq: Every day | ORAL | Status: DC

## 2016-03-13 NOTE — Telephone Encounter (Signed)
DUPLICATE REQUEST.  FILLED ON 03/12/16 FOR 90 DAYS

## 2016-03-17 ENCOUNTER — Other Ambulatory Visit: Payer: BLUE CROSS/BLUE SHIELD

## 2016-03-18 ENCOUNTER — Other Ambulatory Visit (INDEPENDENT_AMBULATORY_CARE_PROVIDER_SITE_OTHER): Payer: 59

## 2016-03-18 DIAGNOSIS — Z0000 Encounter for general adult medical examination without abnormal findings: Secondary | ICD-10-CM

## 2016-03-18 LAB — CBC WITH DIFFERENTIAL/PLATELET
Basophils Absolute: 0 10*3/uL (ref 0.0–0.1)
Basophils Relative: 0.4 % (ref 0.0–3.0)
Eosinophils Absolute: 0.2 10*3/uL (ref 0.0–0.7)
Eosinophils Relative: 2.8 % (ref 0.0–5.0)
HCT: 42.3 % (ref 36.0–46.0)
Hemoglobin: 14 g/dL (ref 12.0–15.0)
Lymphocytes Relative: 26.1 % (ref 12.0–46.0)
Lymphs Abs: 1.5 10*3/uL (ref 0.7–4.0)
MCHC: 33.1 g/dL (ref 30.0–36.0)
MCV: 87.5 fl (ref 78.0–100.0)
Monocytes Absolute: 0.6 10*3/uL (ref 0.1–1.0)
Monocytes Relative: 10 % (ref 3.0–12.0)
Neutro Abs: 3.5 10*3/uL (ref 1.4–7.7)
Neutrophils Relative %: 60.7 % (ref 43.0–77.0)
Platelets: 193 10*3/uL (ref 150.0–400.0)
RBC: 4.84 Mil/uL (ref 3.87–5.11)
RDW: 14.4 % (ref 11.5–15.5)
WBC: 5.8 10*3/uL (ref 4.0–10.5)

## 2016-03-18 LAB — LIPID PANEL
Cholesterol: 144 mg/dL (ref 0–200)
HDL: 48.2 mg/dL (ref >39.00)
LDL Cholesterol: 75 mg/dL (ref 0–99)
NonHDL: 95.56
Total CHOL/HDL Ratio: 3
Triglycerides: 101 mg/dL (ref 0.0–149.0)
VLDL: 20.2 mg/dL (ref 0.0–40.0)

## 2016-03-18 LAB — HEPATIC FUNCTION PANEL
ALT: 16 U/L (ref 0–35)
AST: 19 U/L (ref 0–37)
Albumin: 4.4 g/dL (ref 3.5–5.2)
Alkaline Phosphatase: 62 U/L (ref 39–117)
Bilirubin, Direct: 0.1 mg/dL (ref 0.0–0.3)
Total Bilirubin: 0.5 mg/dL (ref 0.2–1.2)
Total Protein: 6.8 g/dL (ref 6.0–8.3)

## 2016-03-18 LAB — BASIC METABOLIC PANEL
BUN: 17 mg/dL (ref 6–23)
CO2: 26 mEq/L (ref 19–32)
Calcium: 9.5 mg/dL (ref 8.4–10.5)
Chloride: 107 mEq/L (ref 96–112)
Creatinine, Ser: 0.85 mg/dL (ref 0.40–1.20)
GFR: 72.52 mL/min (ref >60.00)
Glucose, Bld: 106 mg/dL — ABNORMAL HIGH (ref 70–99)
Potassium: 4.5 mEq/L (ref 3.5–5.1)
Sodium: 142 mEq/L (ref 135–145)

## 2016-03-18 LAB — TSH: TSH: 2.54 u[IU]/mL (ref 0.35–4.50)

## 2016-03-31 NOTE — Progress Notes (Signed)
Pre visit review using our clinic review tool, if applicable. No additional management support is needed unless otherwise documented below in the visit note.  Chief Complaint  Patient presents with  . Annual Exam    HPI: Patient  Alison Matthews  60 y.o. comes in today for Preventive Health Care visit   Right thumb arthritis  May need surgery  Gramig BP has been good  ? Changing  Med but going away for a month  Vacation.  Had lost 20 + pounds by tracking and staying off sugars  For 4 months but  Business   Regressed eating habits   Busy  .plans to go back to this to help Child with pinworms that recurred .  May need rx  No sx  Health Maintenance  Topic Date Due  . Hepatitis C Screening  11-23-1955  . HIV Screening  03/26/1971  . INFLUENZA VACCINE  05/06/2016  . PAP SMEAR  06/01/2017  . MAMMOGRAM  11/08/2017  . TETANUS/TDAP  07/14/2021  . COLONOSCOPY  08/10/2023  . ZOSTAVAX  Completed   Health Maintenance Review LIFESTYLE:  Exercise:  Acive can do more Tobacco/ETS:no Alcohol: social Sugar beverages: no Sleep:ok Drug use: no    ROS:  nosvt recurrence since ablation but dr Lovena Le GEN/ HEENT: No fever, significant weight changes sweats headaches vision problems hearing changes, CV/ PULM; No chest pain shortness of breath cough, syncope,edema  change in exercise tolerance. GI /GU: No adominal pain, vomiting, change in bowel habits. No blood in the stool. No significant GU symptoms. SKIN/HEME: ,no acute skin rashes suspicious lesions or bleeding. No lymphadenopathy, nodules, masses.  NEURO/ PSYCH:  No neurologic signs such as weakness numbness. No depression anxiety. IMM/ Allergy: No unusual infections.  Allergy .   REST of 12 system review negative except as per HPI   Past Medical History  Diagnosis Date  . Postmenopausal   . SVT (supraventricular tachycardia) ([place])   . 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 Morton's neuroma   . Shoulder pain, left   .  Breast pain     right  . Foot pain     bilateral  . Tingling   . Paroxysmal atrial fibrillation ([place])     During pregnancy 2012, s/p DCCV x 2  . Skin cancer of trunk 02/14/2012  . History of PFTs 2014    nl  . H/O bone density study 2014    nl   . History of ETT     nl low exrcise capacity  . PAF (paroxysmal atrial fibrillation) ([place]) 12/25/2011    During pregnancy 2012, s/p DCCV x 2     Past Surgical History  Procedure Laterality Date  . Cesarean section  10/10  . Polypectomy  9/09    Uterine  . Wisdom tooth extraction  8/76  . Pars plana vitrectomy w/ repair of macular hole  12/09  . Cataract extraction  2/10  . Cesarean section  07/14/2011    Procedure: CESAREAN SECTION;  Surgeon: Daria Pastures;  Location: [facility];  Service: Gynecology;  Laterality: N/A;  Repeat  . Cardiac catheterization  12/24/11    cardiac ablation  . Macular surgery    . Supraventricular tachycardia ablation N/A 12/24/2011    Procedure: SUPRAVENTRICULAR TACHYCARDIA ABLATION;  Surgeon: Evans Lance, MD;  Location: [facility];  Service: Cardiovascular;  Laterality: N/A;  . Hysteroscopy w/d&c N/A 01/17/2015    Procedure: DILATATION AND CURETTAGE /HYSTEROSCOPY/POLYPECTOMY;  Surgeon: Sharyn Lull  Philis Pique, MD;  Location: [facility];  Service: Gynecology;  Laterality: N/A;    Family History  Problem Relation Age of Onset  . Hypertension    . Osteoporosis    . Arthritis    . Colon cancer Neg Hx   . Heart disease Father   . Breast cancer Maternal Aunt   . Diabetes Paternal Grandmother     Social History   Social History  . Marital Status: Married    Spouse Name: N/A  . Number of Children: N/A  . Years of Education: N/A   Social History Main Topics  . Smoking status: Never Smoker   . Smokeless tobacco: Never Used  . Alcohol Use: Yes     Comment:  2 DRINKS A MONTH  . Drug Use: No  . Sexual Activity: Yes   Other Topics Concern  . 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 aspirin 81 MG tablet Take 81 mg by mouth daily.    Marland Kitchen ibuprofen (ADVIL,MOTRIN) 200 MG tablet Take 200 mg by mouth every 6 (six) hours as needed for pain.    Marland Kitchen labetalol (NORMODYNE) 200 MG tablet TAKE 1 TABLET BY MOUTH TWICE DAILY 180 tablet 3  . levothyroxine (SYNTHROID, LEVOTHROID) 25 MCG tablet Take 1 tablet (25 mcg total) by mouth daily. 90 tablet 0  . omeprazole (PRILOSEC) 20 MG capsule Take 20 mg by mouth daily. OTC    . rosuvastatin (CRESTOR) 20 MG tablet Take 1 tablet (20 mg total) by mouth daily. 90 tablet 0   No facility-administered medications prior to visit.     EXAM:  BP 126/72 mmHg  Temp(Src) 97.8 F (36.6 C) (Oral)  Ht 5' 6"  (1.676 m)  Wt 234 lb (106.142 kg)  BMI 37.79 kg/m2  Body mass index is 37.79 kg/(m^2).  Physical Exam: Vital signs reviewed TMB:PJPE is a well-developed well-nourished alert cooperative    who appearsr stated age in no acute distress.  HEENT: normocephalic atraumatic , Eyes: PERRL EOM's full, conjunctiva clear, Nares: paten,t no deformity discharge or tenderness., Ears: no deformity EAC's clear TMs with normal landmarks. Mouth: clear OP, no lesions, edema.  Moist mucous membranes. Dentition in adequate repair. NECK: supple without masses, thyromegaly or bruits. CHEST/PULM:  Clear to auscultation and percussion breath sounds equal no wheeze , rales or rhonchi. No chest wall deformities or tenderness.Breast: normal by inspection . No dimpling, discharge, masses, tenderness or discharge .CV: PMI is nondisplaced, S1 S2 no gallops, murmurs, rubs. Peripheral pulses are full without delay.No JVD .  ABDOMEN: Bowel sounds normal nontender  No guard or rebound,  no hepato splenomegal no CVA tenderness.  No hernia. Extremtities:  No clubbing cyanosis or edema, no acute joint swelling or redness no focal atrophy NEURO:  Oriented x3, cranial nerves 3-12 appear to be intact, no obvious focal weakness,gait within normal limits no abnormal reflexes or asymmetrical SKIN: No acute rashes normal turgor, color, no bruising or petechiae. PSYCH: Oriented, good eye contact, no obvious depression anxiety, cognition and judgment appear normal. LN: no cervical axillary inguinal  adenopathy  Lab Results  Component Value Date   WBC 5.8 03/18/2016   HGB 14.0 03/18/2016   HCT 42.3 03/18/2016   PLT 193.0 03/18/2016   GLUCOSE 106* 03/18/2016   CHOL 144 03/18/2016   TRIG 101.0 03/18/2016   HDL 48.20 03/18/2016   LDLDIRECT 149.6 01/11/2010   LDLCALC 75 03/18/2016   ALT 16 03/18/2016   AST 19 03/18/2016   NA 142 03/18/2016   K 4.5 03/18/2016   CL 107 03/18/2016   CREATININE 0.85 03/18/2016   BUN 17 03/18/2016   CO2 26 03/18/2016   TSH 2.54 03/18/2016   INR 0.9 04/08/2008   HGBA1C 5.7 04/01/2016   BP Readings from Last 3 Encounters:  04/01/16 126/72  02/07/15 102/70  01/17/15 137/73     ASSESSMENT AND PLAN:  Discussed the following assessment and plan:  Visit for preventive health examination  Fasting hyperglycemia - a1c 5.7  lsi to avoid dm  - Plan: POC HgB A1c  Hypothyroidism, unspecified hypothyroidism type - in range   Hyperlipidemia  Agatston CAC score, >100 - 199  Essential hypertension  Medication management  Right thumb arthritis. ENT   sx  Poss reflux  No alarm feature at present. She will  be working on   intensifying lifestyle intervention healthy eating and exercise . contacct [place] if we need to do fo  fam exposures to pin worms  Patient Care Team: Burnis Medin, MD as PCP - General Evans Lance, MD (Cardiology) Bobbye Charleston, MD as Attending Physician (Obstetrics and Gynecology) Crista Luria, MD as Attending Physician  (Dermatology) Griselda Miner, MD as Attending Physician (Dermatology) Viona Gilmore. Erick Alley M D  Patient Instructions   Contact   us after returning about  Changing BP medication .   Possible  Ace inhibitor .  Like lisinopril    Or such and then follow up.   At next blood draw  you can ask for and We can do hep c and hiv  aby  These are screening tests   . You are low risk .   Try  taking your Prilosec every other day to suppress symptoms but minimize dosing.  When you aren't successful for some weight loss again he may not need as much acid blockers.  When you come back follow through with seeing ENT in regard to the right sided swallowing symptoms.  Let us know about the treatment for pinworms it is reasonable to treat the whole family at one person has it.    Health Maintenance, Female Adopting a healthy lifestyle and getting preventive care can go a long way to promote health and wellness. Talk with your health care provider about what schedule of regular examinations is right for you. This is a good chance for you to check in with your provider about disease prevention and staying healthy. In between checkups, there are plenty of things you can do on your own. Experts have done a lot of research about which lifestyle changes and preventive measures are most likely to keep you healthy. Ask your health care provider for more information. WEIGHT AND DIET  Eat a healthy diet  Be sure to include plenty of vegetables, fruits, low-fat dairy products, and lean protein.  Do not eat a lot of foods high in solid fats, added sugars, or salt.  Get regular exercise. This is one of the most important things you can do for your health.  Most adults should exercise for at least 150 minutes each week. The  exercise should increase your heart rate and make you sweat (moderate-intensity exercise).  Most adults should also do strengthening exercises at least twice a week. This is in addition to the  moderate-intensity exercise.  Maintain a healthy weight  Body mass index (BMI) is a measurement that can be used to identify possible weight problems. It estimates body fat based on height and weight. Your health care provider can help determine your BMI and help you achieve or maintain a healthy weight.  For females 56 years of age and older:   A BMI below 18.5 is considered underweight.  A BMI of 18.5 to 24.9 is normal.  A BMI of 25 to 29.9 is considered overweight.  A BMI of 30 and above is considered obese.  Watch levels of cholesterol and blood lipids  You should start having your blood tested for lipids and cholesterol at 60 years of age, then have this test every 5 years.  You may need to have your cholesterol levels checked more often if:  Your lipid or cholesterol levels are high.  You are older than 60 years of age.  You are at high risk for heart disease.  CANCER SCREENING   Lung Cancer  Lung cancer screening is recommended for adults 68-15 years old who are at high risk for lung cancer because of a history of smoking.  A yearly low-dose CT scan of the lungs is recommended for people who:  Currently smoke.  Have quit within the past 15 years.  Have at least a 30-pack-year history of smoking. A pack year is smoking an average of one pack of cigarettes a day for 1 year.  Yearly screening should continue until it has been 15 years since you quit.  Yearly screening should stop if you develop a health problem that would prevent you from having lung cancer treatment.  Breast Cancer  Practice breast self-awareness. This means understanding how your breasts normally appear and feel.  It also means doing regular breast self-exams. Let your health care provider know about any changes, no matter how small.  If you are in your 20s or 30s, you should have a clinical breast exam (CBE) by a health care provider every 1-3 years as part of a regular health exam.  If  you are 8 or older, have a CBE every year. Also consider having a breast X-ray (mammogram) every year.  If you have a family history of breast cancer, talk to your health care provider about genetic screening.  If you are at high risk for breast cancer, talk to your health care provider about having an MRI and a mammogram every year.  Breast cancer gene (BRCA) assessment is recommended for women who have family members with BRCA-related cancers. BRCA-related cancers include:  Breast.  Ovarian.  Tubal.  Peritoneal cancers.  Results of the assessment will determine the need for genetic counseling and BRCA1 and BRCA2 testing. Cervical Cancer Your health care provider may recommend that you be screened regularly for cancer of the pelvic organs (ovaries, uterus, and vagina). This screening involves a pelvic examination, including checking for microscopic changes to the surface of your cervix (Pap test). You may be encouraged to have this screening done every 3 years, beginning at age 2.  For women ages 46-65, health care providers may recommend pelvic exams and Pap testing every 3 years, or they may recommend the Pap and pelvic exam, combined with testing for human papilloma virus (HPV), every 5 years. Some types of HPV  increase your risk of cervical cancer. Testing for HPV may also be done on women of any age with unclear Pap test results.  Other health care providers may not recommend any screening for nonpregnant women who are considered low risk for pelvic cancer and who do not have symptoms. Ask your health care provider if a screening pelvic exam is right for you.  If you have had past treatment for cervical cancer or a condition that could lead to cancer, you need Pap tests and screening for cancer for at least 20 years after your treatment. If Pap tests have been discontinued, your risk factors (such as having a new sexual partner) need to be reassessed to determine if screening should  resume. Some women have medical problems that increase the chance of getting cervical cancer. In these cases, your health care provider may recommend more frequent screening and Pap tests. Colorectal Cancer  This type of cancer can be detected and often prevented.  Routine colorectal cancer screening usually begins at 60 years of age and continues through 60 years of age.  Your health care provider may recommend screening at an earlier age if you have risk factors for colon cancer.  Your health care provider may also recommend using home test kits to check for hidden blood in the stool.  A small camera at the end of a tube can be used to examine your colon directly (sigmoidoscopy or colonoscopy). This is done to check for the earliest forms of colorectal cancer.  Routine screening usually begins at age 31.  Direct examination of the colon should be repeated every 5-10 years through 60 years of age. However, you may need to be screened more often if early forms of precancerous polyps or small growths are found. Skin Cancer  Check your skin from head to toe regularly.  Tell your health care provider about any new moles or changes in moles, especially if there is a change in a mole's shape or color.  Also tell your health care provider if you have a mole that is larger than the size of a pencil eraser.  Always use sunscreen. Apply sunscreen liberally and repeatedly throughout the day.  Protect yourself by wearing long sleeves, pants, a wide-brimmed hat, and sunglasses whenever you are outside. HEART DISEASE, DIABETES, AND HIGH BLOOD PRESSURE   High blood pressure causes heart disease and increases the risk of stroke. High blood pressure is more likely to develop in:  People who have blood pressure in the high end of the normal range (130-139/85-89 mm Hg).  People who are overweight or obese.  People who are African American.  If you are 66-40 years of age, have your blood pressure  checked every 3-5 years. If you are 6 years of age or older, have your blood pressure checked every year. You should have your blood pressure measured twice--once when you are at a hospital or clinic, and once when you are not at a hospital or clinic. Record the average of the two measurements. To check your blood pressure when you are not at a hospital or clinic, you can use:  An automated blood pressure machine at a pharmacy.  A home blood pressure monitor.  If you are between 74 years and 19 years old, ask your health care provider if you should take aspirin to prevent strokes.  Have regular diabetes screenings. This involves taking a blood sample to check your fasting blood sugar level.  If you are at a normal weight  and have a low risk for diabetes, have this test once every three years after 60 years of age.  If you are overweight and have a high risk for diabetes, consider being tested at a younger age or more often. PREVENTING INFECTION  Hepatitis B  If you have a higher risk for hepatitis B, you should be screened for this virus. You are considered at high risk for hepatitis B if:  You were born in a country where hepatitis B is common. Ask your health care provider which countries are considered high risk.  Your parents were born in a high-risk country, and you have not been immunized against hepatitis B (hepatitis B vaccine).  You have HIV or AIDS.  You use needles to inject street drugs.  You live with someone who has hepatitis B.  You have had sex with someone who has hepatitis B.  You get hemodialysis treatment.  You take certain medicines for conditions, including cancer, organ transplantation, and autoimmune conditions. Hepatitis C  Blood testing is recommended for:  Everyone born from [birth year] through 1965.  Anyone with known risk factors for hepatitis C. Sexually transmitted infections (STIs)  You should be screened for sexually transmitted infections (STIs)  including gonorrhea and chlamydia if:  You are sexually active and are younger than 60 years of age.  You are older than 60 years of age and your health care provider tells you that you are at risk for this type of infection.  Your sexual activity has changed since you were last screened and you are at an increased risk for chlamydia or gonorrhea. Ask your health care provider if you are at risk.  If you do not have HIV, but are at risk, it may be recommended that you take a prescription medicine daily to prevent HIV infection. This is called pre-exposure prophylaxis (PrEP). You are considered at risk if:  You are sexually active and do not regularly use condoms or know the HIV status of your partner(s).  You take drugs by injection.  You are sexually active with a partner who has HIV. Talk with your health care provider about whether you are at high risk of being infected with HIV. If you choose to begin PrEP, you should first be tested for HIV. You should then be tested every 3 months for as long as you are taking PrEP.  PREGNANCY   If you are premenopausal and you may become pregnant, ask your health care provider about preconception counseling.  If you may become pregnant, take 400 to 800 micrograms (mcg) of folic acid every day.  If you want to prevent pregnancy, talk to your health care provider about birth control (contraception). OSTEOPOROSIS AND MENOPAUSE   Osteoporosis is a disease in which the bones lose minerals and strength with aging. This can result in serious bone fractures. Your risk for osteoporosis can be identified using a bone density scan.  If you are 24 years of age or older, or if you are at risk for osteoporosis and fractures, ask your health care provider if you should be screened.  Ask your health care provider whether you should take a calcium or vitamin D supplement to lower your risk for osteoporosis.  Menopause may have certain physical symptoms and  risks.  Hormone replacement therapy may reduce some of these symptoms and risks. Talk to your health care provider about whether hormone replacement therapy is right for you.  HOME CARE INSTRUCTIONS   Schedule regular health, dental, and eye  exams.  Stay current with your immunizations.   Do not use any tobacco products including cigarettes, chewing tobacco, or electronic cigarettes.  If you are pregnant, do not drink alcohol.  If you are breastfeeding, limit how much and how often you drink alcohol.  Limit alcohol intake to no more than 1 drink per day for nonpregnant women. One drink equals 12 ounces of beer, 5 ounces of wine, or 1 ounces of hard liquor.  Do not use street drugs.  Do not share needles.  Ask your health care provider for help if you need support or information about quitting drugs.  Tell your health care provider if you often feel depressed.  Tell your health care provider if you have ever been abused or do not feel safe at home.   This information is not intended to replace advice given to you by your health care provider. Make sure you discuss any questions you have with your health care provider.   Document Released: 04/07/2011 Document Revised: 10/13/2014 Document Reviewed: 08/24/2013 Elsevier Interactive Patient Education 2016 [place] K. Aragon Scarantino M.D.

## 2016-03-31 NOTE — Patient Instructions (Addendum)
Contact   us after returning about  Changing BP medication .   Possible  Ace inhibitor .  Like lisinopril    Or such and then follow up.   At next blood draw  you can ask for and We can do hep c and hiv  aby  These are screening tests   . You are low risk .   Try  taking your Prilosec every other day to suppress symptoms but minimize dosing.  When you aren't successful for some weight loss again he may not need as much acid blockers.  When you come back follow through with seeing ENT in regard to the right sided swallowing symptoms.  Let us know about the treatment for pinworms it is reasonable to treat the whole family at one person has it.    Health Maintenance, Female Adopting a healthy lifestyle and getting preventive care can go a long way to promote health and wellness. Talk with your health care provider about what schedule of regular examinations is right for you. This is a good chance for you to check in with your provider about disease prevention and staying healthy. In between checkups, there are plenty of things you can do on your own. Experts have done a lot of research about which lifestyle changes and preventive measures are most likely to keep you healthy. Ask your health care provider for more information. WEIGHT AND DIET  Eat a healthy diet  Be sure to include plenty of vegetables, fruits, low-fat dairy products, and lean protein.  Do not eat a lot of foods high in solid fats, added sugars, or salt.  Get regular exercise. This is one of the most important things you can do for your health.  Most adults should exercise for at least 150 minutes each week. The exercise should increase your heart rate and make you sweat (moderate-intensity exercise).  Most adults should also do strengthening exercises at least twice a week. This is in addition to the moderate-intensity exercise.  Maintain a healthy weight  Body mass index (BMI) is a measurement that can be used to  identify possible weight problems. It estimates body fat based on height and weight. Your health care provider can help determine your BMI and help you achieve or maintain a healthy weight.  For females 42 years of age and older:   A BMI below 18.5 is considered underweight.  A BMI of 18.5 to 24.9 is normal.  A BMI of 25 to 29.9 is considered overweight.  A BMI of 30 and above is considered obese.  Watch levels of cholesterol and blood lipids  You should start having your blood tested for lipids and cholesterol at 60 years of age, then have this test every 5 years.  You may need to have your cholesterol levels checked more often if:  Your lipid or cholesterol levels are high.  You are older than 60 years of age.  You are at high risk for heart disease.  CANCER SCREENING   Lung Cancer  Lung cancer screening is recommended for adults 12-48 years old who are at high risk for lung cancer because of a history of smoking.  A yearly low-dose CT scan of the lungs is recommended for people who:  Currently smoke.  Have quit within the past 15 years.  Have at least a 30-pack-year history of smoking. A pack year is smoking an average of one pack of cigarettes a day for 1 year.  Yearly screening should  continue until it has been 15 years since you quit.  Yearly screening should stop if you develop a health problem that would prevent you from having lung cancer treatment.  Breast Cancer  Practice breast self-awareness. This means understanding how your breasts normally appear and feel.  It also means doing regular breast self-exams. Let your health care provider know about any changes, no matter how small.  If you are in your 20s or 30s, you should have a clinical breast exam (CBE) by a health care provider every 1-3 years as part of a regular health exam.  If you are 62 or older, have a CBE every year. Also consider having a breast X-ray (mammogram) every year.  If you have a  family history of breast cancer, talk to your health care provider about genetic screening.  If you are at high risk for breast cancer, talk to your health care provider about having an MRI and a mammogram every year.  Breast cancer gene (BRCA) assessment is recommended for women who have family members with BRCA-related cancers. BRCA-related cancers include:  Breast.  Ovarian.  Tubal.  Peritoneal cancers.  Results of the assessment will determine the need for genetic counseling and BRCA1 and BRCA2 testing. Cervical Cancer Your health care provider may recommend that you be screened regularly for cancer of the pelvic organs (ovaries, uterus, and vagina). This screening involves a pelvic examination, including checking for microscopic changes to the surface of your cervix (Pap test). You may be encouraged to have this screening done every 3 years, beginning at age 79.  For women ages 52-65, health care providers may recommend pelvic exams and Pap testing every 3 years, or they may recommend the Pap and pelvic exam, combined with testing for human papilloma virus (HPV), every 5 years. Some types of HPV increase your risk of cervical cancer. Testing for HPV may also be done on women of any age with unclear Pap test results.  Other health care providers may not recommend any screening for nonpregnant women who are considered low risk for pelvic cancer and who do not have symptoms. Ask your health care provider if a screening pelvic exam is right for you.  If you have had past treatment for cervical cancer or a condition that could lead to cancer, you need Pap tests and screening for cancer for at least 20 years after your treatment. If Pap tests have been discontinued, your risk factors (such as having a new sexual partner) need to be reassessed to determine if screening should resume. Some women have medical problems that increase the chance of getting cervical cancer. In these cases, your health  care provider may recommend more frequent screening and Pap tests. Colorectal Cancer  This type of cancer can be detected and often prevented.  Routine colorectal cancer screening usually begins at 60 years of age and continues through 60 years of age.  Your health care provider may recommend screening at an earlier age if you have risk factors for colon cancer.  Your health care provider may also recommend using home test kits to check for hidden blood in the stool.  A small camera at the end of a tube can be used to examine your colon directly (sigmoidoscopy or colonoscopy). This is done to check for the earliest forms of colorectal cancer.  Routine screening usually begins at age 68.  Direct examination of the colon should be repeated every 5-10 years through 60 years of age. However, you may need to  be screened more often if early forms of precancerous polyps or small growths are found. Skin Cancer  Check your skin from head to toe regularly.  Tell your health care provider about any new moles or changes in moles, especially if there is a change in a mole's shape or color.  Also tell your health care provider if you have a mole that is larger than the size of a pencil eraser.  Always use sunscreen. Apply sunscreen liberally and repeatedly throughout the day.  Protect yourself by wearing long sleeves, pants, a wide-brimmed hat, and sunglasses whenever you are outside. HEART DISEASE, DIABETES, AND HIGH BLOOD PRESSURE   High blood pressure causes heart disease and increases the risk of stroke. High blood pressure is more likely to develop in:  People who have blood pressure in the high end of the normal range (130-139/85-89 mm Hg).  People who are overweight or obese.  People who are African American.  If you are 15-93 years of age, have your blood pressure checked every 3-5 years. If you are 35 years of age or older, have your blood pressure checked every year. You should have  your blood pressure measured twice--once when you are at a hospital or clinic, and once when you are not at a hospital or clinic. Record the average of the two measurements. To check your blood pressure when you are not at a hospital or clinic, you can use:  An automated blood pressure machine at a pharmacy.  A home blood pressure monitor.  If you are between 34 years and 68 years old, ask your health care provider if you should take aspirin to prevent strokes.  Have regular diabetes screenings. This involves taking a blood sample to check your fasting blood sugar level.  If you are at a normal weight and have a low risk for diabetes, have this test once every three years after 60 years of age.  If you are overweight and have a high risk for diabetes, consider being tested at a younger age or more often. PREVENTING INFECTION  Hepatitis B  If you have a higher risk for hepatitis B, you should be screened for this virus. You are considered at high risk for hepatitis B if:  You were born in a country where hepatitis B is common. Ask your health care provider which countries are considered high risk.  Your parents were born in a high-risk country, and you have not been immunized against hepatitis B (hepatitis B vaccine).  You have HIV or AIDS.  You use needles to inject street drugs.  You live with someone who has hepatitis B.  You have had sex with someone who has hepatitis B.  You get hemodialysis treatment.  You take certain medicines for conditions, including cancer, organ transplantation, and autoimmune conditions. Hepatitis C  Blood testing is recommended for:  Everyone born from [birth year] through 1965.  Anyone with known risk factors for hepatitis C. Sexually transmitted infections (STIs)  You should be screened for sexually transmitted infections (STIs) including gonorrhea and chlamydia if:  You are sexually active and are younger than 60 years of age.  You are older than  60 years of age and your health care provider tells you that you are at risk for this type of infection.  Your sexual activity has changed since you were last screened and you are at an increased risk for chlamydia or gonorrhea. Ask your health care provider if you are at risk.  If you  do not have HIV, but are at risk, it may be recommended that you take a prescription medicine daily to prevent HIV infection. This is called pre-exposure prophylaxis (PrEP). You are considered at risk if:  You are sexually active and do not regularly use condoms or know the HIV status of your partner(s).  You take drugs by injection.  You are sexually active with a partner who has HIV. Talk with your health care provider about whether you are at high risk of being infected with HIV. If you choose to begin PrEP, you should first be tested for HIV. You should then be tested every 3 months for as long as you are taking PrEP.  PREGNANCY   If you are premenopausal and you may become pregnant, ask your health care provider about preconception counseling.  If you may become pregnant, take 400 to 800 micrograms (mcg) of folic acid every day.  If you want to prevent pregnancy, talk to your health care provider about birth control (contraception). OSTEOPOROSIS AND MENOPAUSE   Osteoporosis is a disease in which the bones lose minerals and strength with aging. This can result in serious bone fractures. Your risk for osteoporosis can be identified using a bone density scan.  If you are 49 years of age or older, or if you are at risk for osteoporosis and fractures, ask your health care provider if you should be screened.  Ask your health care provider whether you should take a calcium or vitamin D supplement to lower your risk for osteoporosis.  Menopause may have certain physical symptoms and risks.  Hormone replacement therapy may reduce some of these symptoms and risks. Talk to your health care provider about  whether hormone replacement therapy is right for you.  HOME CARE INSTRUCTIONS   Schedule regular health, dental, and eye exams.  Stay current with your immunizations.   Do not use any tobacco products including cigarettes, chewing tobacco, or electronic cigarettes.  If you are pregnant, do not drink alcohol.  If you are breastfeeding, limit how much and how often you drink alcohol.  Limit alcohol intake to no more than 1 drink per day for nonpregnant women. One drink equals 12 ounces of beer, 5 ounces of wine, or 1 ounces of hard liquor.  Do not use street drugs.  Do not share needles.  Ask your health care provider for help if you need support or information about quitting drugs.  Tell your health care provider if you often feel depressed.  Tell your health care provider if you have ever been abused or do not feel safe at home.   This information is not intended to replace advice given to you by your health care provider. Make sure you discuss any questions you have with your health care provider.   Document Released: 04/07/2011 Document Revised: 10/13/2014 Document Reviewed: 08/24/2013 Elsevier Interactive Patient Education [employer].

## 2016-04-01 ENCOUNTER — Ambulatory Visit (INDEPENDENT_AMBULATORY_CARE_PROVIDER_SITE_OTHER): Payer: 59 | Admitting: Internal Medicine

## 2016-04-01 ENCOUNTER — Encounter: Payer: Self-pay | Admitting: Internal Medicine

## 2016-04-01 VITALS — BP 126/72 | Temp 97.8°F | Ht 66.0 in | Wt 234.0 lb

## 2016-04-01 DIAGNOSIS — Z79899 Other long term (current) drug therapy: Secondary | ICD-10-CM

## 2016-04-01 DIAGNOSIS — Z0000 Encounter for general adult medical examination without abnormal findings: Secondary | ICD-10-CM | POA: Diagnosis not present

## 2016-04-01 DIAGNOSIS — E785 Hyperlipidemia, unspecified: Secondary | ICD-10-CM

## 2016-04-01 DIAGNOSIS — R938 Abnormal findings on diagnostic imaging of other specified body structures: Secondary | ICD-10-CM | POA: Diagnosis not present

## 2016-04-01 DIAGNOSIS — I10 Essential (primary) hypertension: Secondary | ICD-10-CM

## 2016-04-01 DIAGNOSIS — E039 Hypothyroidism, unspecified: Secondary | ICD-10-CM | POA: Diagnosis not present

## 2016-04-01 DIAGNOSIS — R7301 Impaired fasting glucose: Secondary | ICD-10-CM

## 2016-04-01 DIAGNOSIS — R931 Abnormal findings on diagnostic imaging of heart and coronary circulation: Secondary | ICD-10-CM

## 2016-04-01 LAB — POCT GLYCOSYLATED HEMOGLOBIN (HGB A1C): Hemoglobin A1C: 5.7

## 2016-04-02 DIAGNOSIS — I10 Essential (primary) hypertension: Secondary | ICD-10-CM | POA: Insufficient documentation

## 2016-04-02 NOTE — Assessment & Plan Note (Signed)
After returning  Contact us about weaning  Med and changing to med like an acei  Or such  Se disc    Then  Can ROV   about 2 months after changing med .

## 2016-06-27 ENCOUNTER — Other Ambulatory Visit: Payer: Self-pay | Admitting: Internal Medicine

## 2016-06-30 ENCOUNTER — Telehealth: Payer: Self-pay | Admitting: Family Medicine

## 2016-06-30 NOTE — Telephone Encounter (Signed)
Ok to renew the labetolol for 6 months

## 2016-06-30 NOTE — Telephone Encounter (Signed)
Spoke to the pt.  She has not yet weaned from the labetolol.  Stated life has been too crazy.  She plans to wean after the new year and come in for follow up.  Will notify WP.

## 2016-06-30 NOTE — Telephone Encounter (Signed)
Pt is due for follow up. Please help her to schedule.  Thanks!!

## 2016-06-30 NOTE — Telephone Encounter (Signed)
Sent to the pharmacy by e-scribe for 90 days.  Pt due for follow up.  Message sent to scheduling.

## 2016-06-30 NOTE — Telephone Encounter (Signed)
Pt was seen on 03-2016. Pt states she only sees dr Regis Bill once a year and will needs refills on her meds

## 2016-09-22 ENCOUNTER — Other Ambulatory Visit: Payer: Self-pay | Admitting: Internal Medicine

## 2016-09-23 NOTE — Telephone Encounter (Signed)
Labetalol was filled for 3 months on 06/30/16 and then sent to [facility] who agreed to refill for 6 months.  Sent in the other 3 months.  Levothyroxine and Crestor sent in for 6 months.  Last cpx and labs 03/2016.

## 2016-11-06 ENCOUNTER — Other Ambulatory Visit: Payer: Self-pay | Admitting: Internal Medicine

## 2016-11-06 DIAGNOSIS — Z1231 Encounter for screening mammogram for malignant neoplasm of breast: Secondary | ICD-10-CM

## 2016-12-08 ENCOUNTER — Ambulatory Visit
Admission: RE | Admit: 2016-12-08 | Discharge: 2016-12-08 | Disposition: A | Discharge status: Home / Self Care | Payer: 59 | Source: Ambulatory Visit | Attending: Internal Medicine | Admitting: Internal Medicine

## 2016-12-08 DIAGNOSIS — Z1231 Encounter for screening mammogram for malignant neoplasm of breast: Secondary | ICD-10-CM

## 2016-12-18 ENCOUNTER — Encounter: Payer: Self-pay | Admitting: Internal Medicine

## 2016-12-18 ENCOUNTER — Ambulatory Visit (INDEPENDENT_AMBULATORY_CARE_PROVIDER_SITE_OTHER): Payer: 59 | Admitting: Internal Medicine

## 2016-12-18 VITALS — BP 110/70 | HR 73 | Temp 97.8°F | Ht 66.0 in | Wt 238.4 lb

## 2016-12-18 DIAGNOSIS — I10 Essential (primary) hypertension: Secondary | ICD-10-CM | POA: Diagnosis not present

## 2016-12-18 DIAGNOSIS — J069 Acute upper respiratory infection, unspecified: Secondary | ICD-10-CM

## 2016-12-18 NOTE — Progress Notes (Signed)
Chief Complaint  Patient presents with  . Nasal Congestion  . Cough    HPI: Alison Matthews 61 y.o.  sda     ithoink this is a cold  Now about 8-9 days  In ur sx  And to travel to [place].     " Girls gave her" the sx   And then she got   Ur sx  . And   St and then ur congested .   And blowing nose a lot .  No ha but pressure  And mild cough .  Pressure. Concerned that she doesn't want to infect her 31 year old healthy mom. Doesn't usually take cold meds and no face pain  .   ROS: See pertinent positives and negatives per HPI. No fever chills sob   Past Medical History:  Diagnosis Date  . Breast pain    right  . Foot pain    bilateral  . H/O bone density study 01-11-13   nl   . History of ETT    nl low exrcise capacity  . History of PFTs Jan 11, 2013   nl  . 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 Morton's neuroma   . PAF (paroxysmal atrial fibrillation) ([place]) 12/25/2011   During pregnancy Jan 12, 2011, s/p DCCV x 2   . Paroxysmal atrial fibrillation ([place])    During pregnancy 2011-01-12, s/p DCCV x 2  . Postmenopausal   . Shoulder pain, left   . Skin cancer of trunk 02/14/2012  . SVT (supraventricular tachycardia) ([place])   . Tingling     Family History  Problem Relation Age of Onset  . Hypertension    . Osteoporosis    . Arthritis    . Heart disease Father   . Breast cancer Maternal Aunt   . Diabetes Paternal Grandmother   . Colon cancer Neg Hx     Social History   Social History  . Marital status: Married    Spouse name: N/A  . Number of children: N/A  . Years of education: N/A   Social History Main Topics  . Smoking status: Never Smoker  . Smokeless tobacco: Never Used  . Alcohol use Yes     Comment:  2 DRINKS A MONTH  . Drug use: No  . Sexual activity: Yes   Other Topics Concern  . 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 aspirin 81 MG tablet Take 81 mg by mouth daily.    Marland Kitchen labetalol (NORMODYNE) 200 MG tablet TAKE 1 TABLET BY MOUTH TWICE DAILY 180 tablet 0  . levothyroxine (SYNTHROID, LEVOTHROID) 25 MCG tablet TAKE 1 TABLET BY MOUTH DAILY 90 tablet 1  . omeprazole (PRILOSEC) 20 MG capsule Take 20 mg by mouth daily. OTC    . rosuvastatin (CRESTOR) 20 MG tablet TAKE 1 TABLET(20 MG TOTAL) BY MOUTH DAILY 90 tablet 1  . ibuprofen (ADVIL,MOTRIN) 200 MG tablet Take 200 mg by mouth every 6 (six) hours as needed for pain.     No facility-administered medications prior to  visit.      EXAM:  BP 110/70 (BP Location: Right Arm, Patient Position: Sitting, Cuff Size: Normal)   Pulse 73   Temp 97.8 F (36.6 C) (Oral)   Ht 5\' 6"  (1.676 m)   Wt 238 lb 6.4 oz (108.1 kg)   SpO2 97%   BMI 38.48 kg/m   Body mass index is 38.48 kg/m. WDWN in NAD  quiet respirations; mildly congested   Non toxic . HEENT: Normocephalic ;atraumatic , Eyes;  PERRL, EOMs  Full, lids and conjunctiva clear,,Ears: no deformities, canals nl, TM landmarks normal, Nose: no deformity or discharge but congested;face minimally tender Mouth : OP clear without lesion or edema . Neck: Supple without adenopathy or masses or bruits Chest:  Clear to A without wheezes rales or rhonchi CV:  S1-S2 no gallops or murmurs peripheral perfusion is normal Skin :nl perfusion and no acute rashes  PSYCH: pleasant and cooperative, no obvious depression or anxiety  ASSESSMENT AND PLAN:  Discussed the following assessment and plan:  URI, acute - viral with sinus componenet.  Essential hypertension URI probably viral with some sinus component. At this point risk more than benefit of antibiotics. Continue saline Afrin daytime Sudafed cautiously for decongestants if persistent  progressive relapsing or alarm findings contact us for intervention.   Expectant management.  In regard to her hypertension medication with her lifestyle intervention  And better control  she could decrease her medicine in half or switch to a different medicine. She will contact us about plan after she is better and back  In town.  -Patient advised to return or notify health care team  if symptoms worsen ,persist or new concerns arise.  Patient Instructions  Your exam is reassuring and I agree that you have a viral upper respiratory infection involving her sinuses that should resolve on its own. However because you are going to get on a plane would use decongestant such as Sudafed generic. And can also use generic Afrin nose spray for 1-2 days and before you get on the plane. Limit use of this 2-3 days to avoid nose congestion. Add nasal saline washes see below out the mucus easier. If after 2 weeks of illness it is not starting to clear up getting worse contact our office. If you're getting fever series pain come for reevaluation. If you have careful hygiene I do not think he will transmit this to your mom as you are in a convalescent phase but hygiene caution.  In regard to your labetalol treatment I will review your chart about transitioning her to other medications. I usually use an ACE inhibitor but the labetalol should be weaned and not stop suddenly. Contact us when you get back from your trip about a plan.  Or can just t take 1/2 dose of meds      Upper Respiratory Infection, Adult Most upper respiratory infections (URIs) are a viral infection of the air passages leading to the lungs. A URI affects the nose, throat, and upper air passages. The most common type of URI is nasopharyngitis and is typically referred to as "the common cold." URIs run their course and usually go away on their own. Most of the time, a URI does not require medical attention, but sometimes a bacterial infection  in the upper airways can follow a viral infection. This is called a secondary infection. Sinus and middle ear infections are common types of secondary upper respiratory infections. Bacterial pneumonia can also complicate a URI. A URI can worsen  asthma and chronic obstructive pulmonary disease (COPD). Sometimes, these complications can require emergency medical care and may be life threatening. What are the causes? Almost all URIs are caused by viruses. A virus is a type of germ and can spread from one person to another. What increases the risk? You may be at risk for a URI if:  You smoke.  You have chronic heart or lung disease.  You have a weakened defense (immune) system.  You are very young or very old.  You have nasal allergies or asthma.  You work in crowded or poorly ventilated areas.  You work in health care facilities or schools. What are the signs or symptoms? Symptoms typically develop 2-3 days after you come in contact with a cold virus. Most viral URIs last 7-10 days. However, viral URIs from the influenza virus (flu virus) can last 14-18 days and are typically more severe. Symptoms may include:  Runny or stuffy (congested) nose.  Sneezing.  Cough.  Sore throat.  Headache.  Fatigue.  Fever.  Loss of appetite.  Pain in your forehead, behind your eyes, and over your cheekbones (sinus pain).  Muscle aches. How is this diagnosed? Your health care provider may diagnose a URI by:  Physical exam.  Tests to check that your symptoms are not due to another condition such as:  Strep throat.  Sinusitis.  Pneumonia.  Asthma. How is this treated? A URI goes away on its own with time. It cannot be cured with medicines, but medicines may be prescribed or recommended to relieve symptoms. Medicines may help:  Reduce your fever.  Reduce your cough.  Relieve nasal congestion. Follow these instructions at home:  Take medicines only as directed by your health  care provider.  Gargle warm saltwater or take cough drops to comfort your throat as directed by your health care provider.  Use a warm mist humidifier or inhale steam from a shower to increase air moisture. This may make it easier to breathe.  Drink enough fluid to keep your urine clear or pale yellow.  Eat soups and other clear broths and maintain good nutrition.  Rest as needed.  Return to work when your temperature has returned to normal or as your health care provider advises. You may need to stay home longer to avoid infecting others. You can also use a face mask and careful hand washing to prevent spread of the virus.  Increase the usage of your inhaler if you have asthma.  Do not use any tobacco products, including cigarettes, chewing tobacco, or electronic cigarettes. If you need help quitting, ask your health care provider. How is this prevented? The best way to protect yourself from getting a cold is to practice good hygiene.  Avoid oral or hand contact with people with cold symptoms.  Wash your hands often if contact occurs. There is no clear evidence that vitamin C, vitamin E, echinacea, or exercise reduces the chance of developing a cold. However, it is always recommended to get plenty of rest, exercise, and practice good nutrition. Contact a health care provider if:  You are getting worse rather than better.  Your symptoms are not controlled by medicine.  You have chills.  You have worsening shortness of breath.  You have brown or red mucus.  You have yellow or brown nasal discharge.  You have pain in your face, especially when you bend forward.  You have a fever.  You have swollen neck glands.  You have pain while swallowing.  You have white areas in the back of your throat. Get help right away if:  You have severe or persistent:  Headache.  Ear pain.  Sinus pain.  Chest pain.  You have chronic lung disease and any of the  following:  Wheezing.  Prolonged cough.  Coughing up blood.  A change in your usual mucus.  You have a stiff neck.  You have changes in your:  Vision.  Hearing.  Thinking.  Mood. This information is not intended to replace advice given to you by your health care provider. Make sure you discuss any questions you have with your health care provider. Document Released: 03/18/2001 Document Revised: 05/25/2016 Document Reviewed: 12/28/2013 Elsevier Interactive Patient Education  2017 [place] K. Matei Magnone M.D.

## 2016-12-18 NOTE — Patient Instructions (Addendum)
Your exam is reassuring and I agree that you have a viral upper respiratory infection involving her sinuses that should resolve on its own. However because you are going to get on a plane would use decongestant such as Sudafed generic. And can also use generic Afrin nose spray for 1-2 days and before you get on the plane. Limit use of this 2-3 days to avoid nose congestion. Add nasal saline washes see below out the mucus easier. If after 2 weeks of illness it is not starting to clear up getting worse contact our office. If you're getting fever series pain come for reevaluation. If you have careful hygiene I do not think he will transmit this to your mom as you are in a convalescent phase but hygiene caution.  In regard to your labetalol treatment I will review your chart about transitioning her to other medications. I usually use an ACE inhibitor but the labetalol should be weaned and not stop suddenly. Contact us when you get back from your trip about a plan.  Or can just t take 1/2 dose of meds      Upper Respiratory Infection, Adult Most upper respiratory infections (URIs) are a viral infection of the air passages leading to the lungs. A URI affects the nose, throat, and upper air passages. The most common type of URI is nasopharyngitis and is typically referred to as "the common cold." URIs run their course and usually go away on their own. Most of the time, a URI does not require medical attention, but sometimes a bacterial infection in the upper airways can follow a viral infection. This is called a secondary infection. Sinus and middle ear infections are common types of secondary upper respiratory infections. Bacterial pneumonia can also complicate a URI. A URI can worsen asthma and chronic obstructive pulmonary disease (COPD). Sometimes, these complications can require emergency medical care and may be life threatening. What are the causes? Almost all URIs are caused by viruses. A virus is  a type of germ and can spread from one person to another. What increases the risk? You may be at risk for a URI if:  You smoke.  You have chronic heart or lung disease.  You have a weakened defense (immune) system.  You are very young or very old.  You have nasal allergies or asthma.  You work in crowded or poorly ventilated areas.  You work in health care facilities or schools. What are the signs or symptoms? Symptoms typically develop 2-3 days after you come in contact with a cold virus. Most viral URIs last 7-10 days. However, viral URIs from the influenza virus (flu virus) can last 14-18 days and are typically more severe. Symptoms may include:  Runny or stuffy (congested) nose.  Sneezing.  Cough.  Sore throat.  Headache.  Fatigue.  Fever.  Loss of appetite.  Pain in your forehead, behind your eyes, and over your cheekbones (sinus pain).  Muscle aches. How is this diagnosed? Your health care provider may diagnose a URI by:  Physical exam.  Tests to check that your symptoms are not due to another condition such as:  Strep throat.  Sinusitis.  Pneumonia.  Asthma. How is this treated? A URI goes away on its own with time. It cannot be cured with medicines, but medicines may be prescribed or recommended to relieve symptoms. Medicines may help:  Reduce your fever.  Reduce your cough.  Relieve nasal congestion. Follow these instructions at home:  Take medicines only as  directed by your health care provider.  Gargle warm saltwater or take cough drops to comfort your throat as directed by your health care provider.  Use a warm mist humidifier or inhale steam from a shower to increase air moisture. This may make it easier to breathe.  Drink enough fluid to keep your urine clear or pale yellow.  Eat soups and other clear broths and maintain good nutrition.  Rest as needed.  Return to work when your temperature has returned to normal or as your  health care provider advises. You may need to stay home longer to avoid infecting others. You can also use a face mask and careful hand washing to prevent spread of the virus.  Increase the usage of your inhaler if you have asthma.  Do not use any tobacco products, including cigarettes, chewing tobacco, or electronic cigarettes. If you need help quitting, ask your health care provider. How is this prevented? The best way to protect yourself from getting a cold is to practice good hygiene.  Avoid oral or hand contact with people with cold symptoms.  Wash your hands often if contact occurs. There is no clear evidence that vitamin C, vitamin E, echinacea, or exercise reduces the chance of developing a cold. However, it is always recommended to get plenty of rest, exercise, and practice good nutrition. Contact a health care provider if:  You are getting worse rather than better.  Your symptoms are not controlled by medicine.  You have chills.  You have worsening shortness of breath.  You have brown or red mucus.  You have yellow or brown nasal discharge.  You have pain in your face, especially when you bend forward.  You have a fever.  You have swollen neck glands.  You have pain while swallowing.  You have white areas in the back of your throat. Get help right away if:  You have severe or persistent:  Headache.  Ear pain.  Sinus pain.  Chest pain.  You have chronic lung disease and any of the following:  Wheezing.  Prolonged cough.  Coughing up blood.  A change in your usual mucus.  You have a stiff neck.  You have changes in your:  Vision.  Hearing.  Thinking.  Mood. This information is not intended to replace advice given to you by your health care provider. Make sure you discuss any questions you have with your health care provider. Document Released: 03/18/2001 Document Revised: 05/25/2016 Document Reviewed: 12/28/2013 Elsevier Interactive  Patient Education  2017 [employer].

## 2016-12-23 ENCOUNTER — Telehealth: Payer: Self-pay | Admitting: Emergency Medicine

## 2016-12-23 ENCOUNTER — Other Ambulatory Visit: Payer: Self-pay | Admitting: Internal Medicine

## 2016-12-23 NOTE — Telephone Encounter (Signed)
Left voicemail for pt to give the office a call back in regards to [place] medication (has she stopped it or how many tabs is she taking daily)? Pt also needs appointment scheduled for annual and labs.

## 2017-01-13 ENCOUNTER — Ambulatory Visit (INDEPENDENT_AMBULATORY_CARE_PROVIDER_SITE_OTHER): Payer: 59 | Admitting: Sports Medicine

## 2017-01-13 ENCOUNTER — Encounter: Payer: Self-pay | Admitting: Sports Medicine

## 2017-01-13 DIAGNOSIS — S61309A Unspecified open wound of unspecified finger with damage to nail, initial encounter: Secondary | ICD-10-CM | POA: Diagnosis not present

## 2017-01-13 DIAGNOSIS — IMO0001 Reserved for inherently not codable concepts without codable children: Secondary | ICD-10-CM

## 2017-01-13 DIAGNOSIS — M79675 Pain in left toe(s): Secondary | ICD-10-CM | POA: Diagnosis not present

## 2017-01-13 DIAGNOSIS — B351 Tinea unguium: Secondary | ICD-10-CM | POA: Diagnosis not present

## 2017-01-13 DIAGNOSIS — M79609 Pain in unspecified limb: Secondary | ICD-10-CM | POA: Diagnosis not present

## 2017-01-13 DIAGNOSIS — M79674 Pain in right toe(s): Secondary | ICD-10-CM

## 2017-01-13 NOTE — Progress Notes (Signed)
Subjective: Alison Matthews is a 61 y.o. female patient presents to office today complaining of  Right 1st toe pain after knocking part of nail off last week was started on antibiotics by urgent care and soaked. Patient is also concerned with the thickness at left 1st toenail and desires treatment. Patient denies any other pedal complaints.   Patient Active Problem List   Diagnosis Date Noted  . Essential hypertension 04/02/2016  . Other and unspecified hyperlipidemia 08/25/2013  . Stress 08/25/2013  . Rectal bleeding 07/20/2013  . Change in bowel habits 07/20/2013  . Hypothyroidism 01/08/2013  . Hx of atrial fibrillation  01/08/2013  . GERD (gastroesophageal reflux disease) 01/08/2013  . 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 Bereavement 01/08/2013  . Visit for preventive health examination 02/13/2012  . Dyslipidemia 07/31/2011  . Atrial fibrillation ([place]) 03/10/2011  . SVT (supraventricular tachycardia) ([place]) 03/10/2011  . TMJ PAIN 04/02/2010  . MORTON'S NEUROMA 01/30/2010  . SHOULDER PAIN, LEFT 01/11/2010  . BREAST PAIN, RIGHT 08/17/2009  . ASYMPTOMATIC POSTMENOPAUSAL STATUS 08/17/2009  . HYPERTENSION 01/15/2009  . FOOT PAIN, BILATERAL 01/15/2009  . TINGLING 01/15/2009    Current Outpatient Prescriptions on File Prior to Visit  Medication Sig Dispense Refill  . aspirin 81 MG tablet Take 81 mg by mouth daily.    Marland Kitchen labetalol (NORMODYNE) 200 MG tablet TAKE 1 TABLET BY MOUTH TWICE DAILY 180 tablet 0  . levothyroxine (SYNTHROID, LEVOTHROID) 25 MCG tablet TAKE 1 TABLET BY MOUTH DAILY 90 tablet 1  . omeprazole (PRILOSEC) 20 MG capsule Take 20 mg by mouth daily. OTC    . rosuvastatin (CRESTOR) 20 MG tablet TAKE 1 TABLET(20 MG TOTAL) BY MOUTH DAILY 90 tablet 1   No current facility-administered medications on file prior to visit.     Allergies  Allergen Reactions  . Latex Itching    Objective:  There were no vitals filed for this visit.  General: Well developed,  nourished, in no acute distress, alert and oriented x3   Dermatology: Skin is warm, dry and supple bilateral. Right hallux nail appears to be partially attached (-) Erythema. (-) Edema. (+) serosanguous  drainage present. The left 1st toenail is thick concerning of mycosis, remaining nails appear unremarkable at this time. There are no open sores, lesions or other signs of infection  present.  Vascular: Dorsalis Pedis artery and Posterior Tibial artery pedal pulses are 2/4 bilateral with immedate capillary fill time. Pedal hair growth present. No lower extremity edema.   Neruologic: Grossly intact via light touch bilateral.  Musculoskeletal: Tenderness to palpation of the Right>left hallux nails. Muscular strength within normal limits in all groups bilateral.   Assesement and Plan: Problem List Items Addressed This Visit    None    Visit Diagnoses    Avulsion of nail plate, initial encounter    -  Primary   right 1st toe   Pain due to onychomycosis of nail       Relevant Medications   cefdinir (OMNICEF) 300 MG capsule   Other Relevant Orders   Culture, fungus without smear   Toe pain, bilateral          -Discussed treatment alternatives and plan of care; Explained permanent/temporary nail avulsion and post procedure course to patient. - After a verbal consent, injected 3 ml of a 50:50 mixture of 2% plain  lidocaine and 0.5% plain marcaine in a normal hallux block fashion. Next, a  betadine prep was performed. Anesthesia was tested and found to  be appropriate.  The right hallux nail was then incised from the hyponychium to the epinychium. The offending nail border was removed and cleared from the field then flushed with alcohol and dressed with antibiotic cream and a dry sterile dressing. -Patient was instructed to leave the dressing intact for today and begin soaking  in a weak solution of betadine or Epsom salt and water tomorrow. Patient was instructed to  soak for 15 minutes  each day and apply neosporin and a gauze or bandaid dressing each day. -Patient was instructed to monitor the toe for signs of infection and return to office if toe becomes red, hot or swollen. -Advised ice, elevation, and tylenol or motrin if needed for pain.  -Obtained nail sample from left hallux and sent to [facility] for fungal culture results  -Patient is to return in 4 weeks for follow up care/nail check and discuss fungal culture results or sooner if problems arise.  Landis Martins, DPM

## 2017-01-13 NOTE — Progress Notes (Signed)
   Subjective:    Patient ID: Alison Matthews, female    DOB: 1956-04-14, 61 y.o.   MRN: [id number]  HPI  Chief Complaint  Patient presents with  . Nail Problem    Right; Great toe; Pt stated over the weekend on a camping trip, was moving a small table and accidentally somehow lifted the toe nail. Denies dropping or injuring the nail. Went to a urgent care and was prescribed Cefedinir and has been soaking the toe in epsom salt. The toe nail is loose.   . Nail Problem    Left; Great Toe; Thick, concerned it is toe nail fungus.        Review of Systems     Objective:   Physical Exam        Assessment & Plan:

## 2017-01-13 NOTE — Patient Instructions (Signed)

## 2017-01-23 ENCOUNTER — Encounter (HOSPITAL_COMMUNITY): Payer: Self-pay | Admitting: Emergency Medicine

## 2017-01-23 ENCOUNTER — Emergency Department (HOSPITAL_COMMUNITY): Payer: 59

## 2017-01-23 ENCOUNTER — Emergency Department (HOSPITAL_COMMUNITY)
Admission: EM | Admit: 2017-01-23 | Discharge: 2017-01-23 | Disposition: A | Discharge status: Home / Self Care | Payer: 59 | Attending: Emergency Medicine | Admitting: Emergency Medicine

## 2017-01-23 DIAGNOSIS — I10 Essential (primary) hypertension: Secondary | ICD-10-CM | POA: Diagnosis not present

## 2017-01-23 DIAGNOSIS — I951 Orthostatic hypotension: Secondary | ICD-10-CM | POA: Diagnosis not present

## 2017-01-23 DIAGNOSIS — R197 Diarrhea, unspecified: Secondary | ICD-10-CM | POA: Diagnosis not present

## 2017-01-23 DIAGNOSIS — Z79899 Other long term (current) drug therapy: Secondary | ICD-10-CM | POA: Diagnosis not present

## 2017-01-23 DIAGNOSIS — Z7982 Long term (current) use of aspirin: Secondary | ICD-10-CM | POA: Insufficient documentation

## 2017-01-23 DIAGNOSIS — R112 Nausea with vomiting, unspecified: Secondary | ICD-10-CM

## 2017-01-23 DIAGNOSIS — Z91040 Latex allergy status: Secondary | ICD-10-CM | POA: Diagnosis not present

## 2017-01-23 DIAGNOSIS — R55 Syncope and collapse: Secondary | ICD-10-CM | POA: Diagnosis present

## 2017-01-23 DIAGNOSIS — E860 Dehydration: Secondary | ICD-10-CM

## 2017-01-23 LAB — I-STAT BETA HCG BLOOD, ED (MC, WL, AP ONLY): I-stat hCG, quantitative: <5 m[IU]/mL (ref <5)

## 2017-01-23 LAB — CBC WITH DIFFERENTIAL/PLATELET
Basophils Absolute: 0 10*3/uL (ref 0.0–0.1)
Basophils Relative: 0 %
Eosinophils Absolute: 0.3 10*3/uL (ref 0.0–0.7)
Eosinophils Relative: 2 %
HCT: 42.3 % (ref 36.0–46.0)
Hemoglobin: 13.6 g/dL (ref 12.0–15.0)
Lymphocytes Relative: 12 %
Lymphs Abs: 1.4 10*3/uL (ref 0.7–4.0)
MCH: 28.6 pg (ref 26.0–34.0)
MCHC: 32.2 g/dL (ref 30.0–36.0)
MCV: 89.1 fL (ref 78.0–100.0)
Monocytes Absolute: 1.2 10*3/uL — ABNORMAL HIGH (ref 0.1–1.0)
Monocytes Relative: 10 %
Neutro Abs: 9.1 10*3/uL — ABNORMAL HIGH (ref 1.7–7.7)
Neutrophils Relative %: 76 %
Platelets: 177 10*3/uL (ref 150–400)
RBC: 4.75 MIL/uL (ref 3.87–5.11)
RDW: 14 % (ref 11.5–15.5)
WBC: 12 10*3/uL — ABNORMAL HIGH (ref 4.0–10.5)

## 2017-01-23 LAB — BASIC METABOLIC PANEL
Anion gap: 11 (ref 5–15)
BUN: 18 mg/dL (ref 6–20)
CO2: 24 mmol/L (ref 22–32)
Calcium: 9.4 mg/dL (ref 8.9–10.3)
Chloride: 105 mmol/L (ref 101–111)
Creatinine, Ser: 0.88 mg/dL (ref 0.44–1.00)
GFR calc Af Amer: >60 mL/min (ref >60)
GFR calc non Af Amer: >60 mL/min (ref >60)
Glucose, Bld: 116 mg/dL — ABNORMAL HIGH (ref 65–99)
Potassium: 4 mmol/L (ref 3.5–5.1)
Sodium: 140 mmol/L (ref 135–145)

## 2017-01-23 LAB — URINALYSIS, ROUTINE W REFLEX MICROSCOPIC
Bilirubin Urine: NEGATIVE
Glucose, UA: NEGATIVE mg/dL
Hgb urine dipstick: NEGATIVE
Ketones, ur: NEGATIVE mg/dL
Leukocytes, UA: NEGATIVE
Nitrite: NEGATIVE
Protein, ur: NEGATIVE mg/dL
Specific Gravity, Urine: 1.02 (ref 1.005–1.030)
pH: 6 (ref 5.0–8.0)

## 2017-01-23 LAB — TROPONIN I: Troponin I: <0.03 ng/mL (ref <0.03)

## 2017-01-23 LAB — CBG MONITORING, ED: Glucose-Capillary: 100 mg/dL — ABNORMAL HIGH (ref 65–99)

## 2017-01-23 MED ORDER — SODIUM CHLORIDE 0.9 % IV SOLN: INTRAVENOUS | Status: DC

## 2017-01-23 MED ORDER — SODIUM CHLORIDE 0.9 % IV BOLUS (SEPSIS)
1000.0000 mL | Freq: Once | INTRAVENOUS | Status: AC
  Administered 2017-01-23: 1000 mL via INTRAVENOUS

## 2017-01-23 MED ORDER — ONDANSETRON 4 MG PO TBDP: 4.0000 mg | ORAL_TABLET | Freq: Three times a day (TID) | ORAL | 0 refills | Status: DC | PRN

## 2017-01-23 MED ORDER — ONDANSETRON HCL 4 MG/2ML IJ SOLN
4.0000 mg | Freq: Once | INTRAMUSCULAR | Status: AC
  Administered 2017-01-23: 4 mg via INTRAVENOUS
  Filled 2017-01-23: qty 2

## 2017-01-23 NOTE — ED Notes (Signed)
Pt given ice upon request.

## 2017-01-23 NOTE — ED Provider Notes (Signed)
[facility] DEPT Provider Note   CSN: [id number] Arrival date & time: 01/23/17  8841  By signing my name below, I, [name], attest that this documentation has been prepared under the direction and in the presence of [name], MD. Electronically Signed: [name], Scribe. 01/23/17. 3:39 AM.  History   Chief Complaint Chief Complaint  Patient presents with  . Loss of Consciousness   HPI Comments:  Alison Matthews is a 61 y.o. female with a history of PAF, SVT, and HTN, who presents to the Emergency Department via EMS, complaining of syncopal episode that happened earlier tonight at [place]. Patient states she woke up feeling nauseous, walked to the bathroom and felt as if she had diarrhea. Patient ran to the bathroom to have a bowel movement. Patient states minutes later she had a syncopal episode on the toilet. Patient came to senses and had another episode of diarrhea. Patient reports associated nausea and diarrhea. No modifying factors indicated. Patient denies any chest pain, shortness of breath, abdominal pain, vomiting, or head injury.   The history is provided by the patient. No language interpreter was used.    Past Medical History:  Diagnosis Date  . Breast pain    right  . Foot pain    bilateral  . H/O bone density study 2014   nl   . History of ETT    nl low exrcise capacity  . History of PFTs 2014   nl  . 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 Morton's neuroma   . PAF (paroxysmal atrial fibrillation) ([place]) 12/25/2011   During pregnancy 2012, s/p DCCV x 2   . Paroxysmal atrial fibrillation ([place])    During pregnancy 2012, s/p DCCV x 2  . Postmenopausal   . Shoulder pain, left   . Skin cancer of trunk 02/14/2012  . SVT (supraventricular tachycardia) ([place])   . Tingling     Patient Active Problem List   Diagnosis Date Noted  . Essential hypertension 04/02/2016  . Other and unspecified hyperlipidemia 08/25/2013  . Stress 08/25/2013  .  Rectal bleeding 07/20/2013  . Change in bowel habits 07/20/2013  . Hypothyroidism 01/08/2013  . Hx of atrial fibrillation  01/08/2013  . GERD (gastroesophageal reflux disease) 01/08/2013  . 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 Bereavement 01/08/2013  . Visit for preventive health examination 02/13/2012  . Dyslipidemia 07/31/2011  . Atrial fibrillation ([place]) 03/10/2011  . SVT (supraventricular tachycardia) ([place]) 03/10/2011  . TMJ PAIN 04/02/2010  . MORTON'S NEUROMA 01/30/2010  . SHOULDER PAIN, LEFT 01/11/2010  . BREAST PAIN, RIGHT 08/17/2009  . ASYMPTOMATIC POSTMENOPAUSAL STATUS 08/17/2009  . HYPERTENSION 01/15/2009  . FOOT PAIN, BILATERAL 01/15/2009  . TINGLING 01/15/2009    Past Surgical History:  Procedure Laterality Date  . CARDIAC CATHETERIZATION  12/24/11   cardiac ablation  . CATARACT EXTRACTION  2/10  . CESAREAN SECTION  10/10  . CESAREAN SECTION  07/14/2011   Procedure: CESAREAN SECTION;  Surgeon: Daria Pastures;  Location: [facility];  Service: Gynecology;  Laterality: N/A;  Repeat  . HYSTEROSCOPY W/D&C N/A 01/17/2015   Procedure: DILATATION AND CURETTAGE /HYSTEROSCOPY/POLYPECTOMY;  Surgeon: Bobbye Charleston, MD;  Location: [facility];  Service: Gynecology;  Laterality: N/A;  . macular surgery    . PARS PLANA VITRECTOMY W/ REPAIR OF MACULAR HOLE  12/09  . POLYPECTOMY  9/09   Uterine  . SUPRAVENTRICULAR TACHYCARDIA ABLATION N/A 12/24/2011   Procedure: SUPRAVENTRICULAR TACHYCARDIA ABLATION;  Surgeon: Champ Mungo  Lovena Le, MD;  Location: [facility] CATH LAB;  Service: Cardiovascular;  Laterality: N/A;  . WISDOM TOOTH EXTRACTION  8/76    OB History    Gravida Para Term Preterm AB Living   2 2 2  0 0 2   SAB TAB Ectopic Multiple Live Births   0 0 0 0 1       Home Medications    Prior to Admission medications   Medication Sig Start Date End Date Taking? Authorizing Provider  aspirin 81 MG tablet Take 81 mg by mouth daily.   Yes Historical Provider, MD  labetalol (NORMODYNE)  200 MG tablet TAKE 1 TABLET BY MOUTH TWICE DAILY 12/23/16  Yes Burnis Medin, MD  levothyroxine (SYNTHROID, LEVOTHROID) 25 MCG tablet TAKE 1 TABLET BY MOUTH DAILY 09/23/16  Yes Burnis Medin, MD  omeprazole (PRILOSEC) 20 MG capsule Take 20 mg by mouth daily. OTC   Yes Historical Provider, MD  rosuvastatin (CRESTOR) 20 MG tablet TAKE 1 TABLET(20 MG TOTAL) BY MOUTH DAILY 09/23/16  Yes Burnis Medin, MD  ondansetron (ZOFRAN ODT) 4 MG disintegrating tablet Take 1 tablet (4 mg total) by mouth every 8 (eight) hours as needed. 01/23/17   [name], MD    Family History Family History  Problem Relation Age of Onset  . Hypertension    . Osteoporosis    . Arthritis    . Heart disease Father   . Breast cancer Maternal Aunt   . Diabetes Paternal Grandmother   . Colon cancer Neg Hx     Social History Social History  Substance Use Topics  . Smoking status: Never Smoker  . Smokeless tobacco: Never Used  . Alcohol use Yes     Comment:  2 DRINKS A MONTH     Allergies   Latex   Review of Systems Review of Systems  Respiratory: Negative for shortness of breath.   Cardiovascular: Negative for chest pain.  Gastrointestinal: Positive for diarrhea and nausea. Negative for abdominal pain and vomiting.  Neurological: Positive for syncope.  All other systems reviewed and are negative.    Physical Exam Updated Vital Signs BP (!) 125/53   Pulse 81   Temp 98 F (36.7 C) (Oral)   Resp (!) 30   Ht 5\' 6"  (1.676 m)   Wt 238 lb (108 kg)   SpO2 99%   BMI 38.41 kg/m   Physical Exam  Constitutional: She is oriented to person, place, and time. She appears well-developed.  HENT:  Head: Normocephalic and atraumatic.  Dry mucous membranes.   Eyes: Conjunctivae and EOM are normal. Pupils are equal, round, and reactive to light.  Neck: Normal range of motion. Neck supple.  Cardiovascular: Normal rate.   Pulmonary/Chest: Effort normal.  Abdominal: Soft. Bowel sounds are normal.    Musculoskeletal: Normal range of motion.  Neurological: She is alert and oriented to person, place, and time.  Skin: Skin is warm and dry.  Psychiatric: She has a normal mood and affect.  Nursing note and vitals reviewed.    ED Treatments / Results  DIAGNOSTIC STUDIES: Oxygen Saturation is 96% on RA, adequate by my interpretation.    COORDINATION OF CARE: 3:38 AM Discussed treatment plan with pt at bedside and pt agreed to plan, which includes fluids, blood work, and EKG.    Labs (all labs ordered are listed, but only abnormal results are displayed) Labs Reviewed  BASIC METABOLIC PANEL - Abnormal; Notable for the following:       Result Value  Glucose, Bld 116 (*)    All other components within normal limits  CBC WITH DIFFERENTIAL/PLATELET - Abnormal; Notable for the following:    WBC 12.0 (*)    Neutro Abs 9.1 (*)    Monocytes Absolute 1.2 (*)    All other components within normal limits  CBG MONITORING, ED - Abnormal; Notable for the following:    Glucose-Capillary 100 (*)    All other components within normal limits  TROPONIN I  URINALYSIS, ROUTINE W REFLEX MICROSCOPIC  I-STAT BETA HCG BLOOD, ED (MC, [facility], AP ONLY)    EKG  EKG Interpretation  Date/Time:  Friday January 23 2017 03:28:13 EDT Ventricular Rate:  75 PR Interval:    QRS Duration: 97 QT Interval:  411 QTC Calculation: 460 R Axis:   74 Text Interpretation:  Sinus rhythm RSR' in V1 or V2, right VCD or RVH Confirmed by [facility] MD, Cashae Weich ([id number]) on 01/23/2017 3:59:41 AM       Radiology Dg Chest 2 View  Result Date: 01/23/2017 CLINICAL DATA:  Shortness of breath, nausea, and syncope tonight. History of ablation. EXAM: CHEST  2 VIEW COMPARISON:  12/23/2011 FINDINGS: The heart size and mediastinal contours are within normal limits. Both lungs are clear. The visualized skeletal structures are unremarkable. IMPRESSION: No active cardiopulmonary disease. Electronically Signed   By: Lucienne Capers M.D.   On:  01/23/2017 04:08    Procedures Procedures (including critical care time)  Medications Ordered in ED Medications  sodium chloride 0.9 % bolus 1,000 mL (1,000 mLs Intravenous New Bag/Given 01/23/17 0508)    And  0.9 %  sodium chloride infusion (not administered)  ondansetron (ZOFRAN) injection 4 mg (4 mg Intravenous Given 01/23/17 0508)     Initial Impression / Assessment and Plan / ED Course  I have reviewed the triage vital signs and the nursing notes.  Pertinent labs & imaging results that were available during my care of the patient were reviewed by me and considered in my medical decision making (see chart for details).   Pt is feeling much better.  She likely passed out due to orthostatic hypotension combination of dehydration/n/v/d.   She is no longer orthostatic.  She knows to return if worse.  Final Clinical Impressions(s) / ED Diagnoses   Final diagnoses:  Dehydration  Orthostatic hypotension  Diarrhea, unspecified type  Non-intractable vomiting with nausea, unspecified vomiting type    New Prescriptions New Prescriptions   ONDANSETRON (ZOFRAN ODT) 4 MG DISINTEGRATING TABLET    Take 1 tablet (4 mg total) by mouth every 8 (eight) hours as needed.   I personally performed the services described in this documentation, which was scribed in my presence. The recorded information has been reviewed and is accurate.     [name], MD 01/23/17 [phone]

## 2017-01-23 NOTE — ED Triage Notes (Signed)
Per EMS pt had a syncopal episode following an episode of diarrhea around 130.  Pt states she called her husband's cell phone from her bedroom and told him she needed assistance and then awoke with him trying to "wake her up".  Pt has a history of cardiac ablation about 8 years ago d/t afib.  Upon arrival BP was 90s palpated.  Consistently systolics in the 850Y since then.  Pt has been alert and oriented with EMS.  Pt complains of feeling hot and clammy.

## 2017-02-10 ENCOUNTER — Encounter: Payer: Self-pay | Admitting: Podiatry

## 2017-02-10 ENCOUNTER — Ambulatory Visit (INDEPENDENT_AMBULATORY_CARE_PROVIDER_SITE_OTHER): Payer: 59 | Admitting: Podiatry

## 2017-02-10 ENCOUNTER — Ambulatory Visit: Payer: 59 | Admitting: Sports Medicine

## 2017-02-10 DIAGNOSIS — Z09 Encounter for follow-up examination after completed treatment for conditions other than malignant neoplasm: Secondary | ICD-10-CM

## 2017-02-10 DIAGNOSIS — B351 Tinea unguium: Secondary | ICD-10-CM

## 2017-02-10 DIAGNOSIS — M79609 Pain in unspecified limb: Secondary | ICD-10-CM

## 2017-02-10 NOTE — Progress Notes (Signed)
This patient returns to the office following nail surgery for the removal of the great toenail of the right foot. 4 weeks ago. She says that her toe is healing well and that there is minimal drainage at this time. She is pleased with her progress. She also returns to the office to receive the lab results from [facility]. The results from her labs results   failed to demonstrate any fungal elements in her big toenail left foot.  Her nail revealed microtrauma.  She presents the office today for continued evaluation and treatment of her nails  GENERAL APPEARANCE: Alert, conversant. Appropriately groomed. No acute distress.  VASCULAR: Pedal pulses are  palpable at  [facility] and PT bilateral.  Capillary refill time is immediate to all digits,  Normal temperature gradient.  Digital hair growth is present bilateral  NEUROLOGIC: sensation is normal to 5.07 monofilament at 5/5 sites bilateral.  Light touch is intact bilateral, Muscle strength normal.  MUSCULOSKELETAL: acceptable muscle strength, tone and stability bilateral.  Intrinsic muscluature intact bilateral.  Rectus appearance of foot and digits noted bilateral.   DERMATOLOGIC: skin color, texture, and turgor are within normal limits.  No preulcerative lesions or ulcers  are seen, no interdigital maceration noted.  No open lesions present.  Digital nails are asymptomatic. No drainage noted. Her left hallux nail is thickened and disfigured.  Her right hallux is healing with no  evidence of redness or drainage.  S/P nail surgery  Onychomycosis    ROV. Confirmed with this patient that her right hallux is healing at this time. Her left hallux is thick and disfigured and this is consistent with the microtrauma found from the lab. We discussed treatment of her nail including additional nail surgery for the left hallux. Finally, we talked about the neuroma that she is experiencing in the third interspace of the left foot. She says she will call the office if she walks and  decides to proceed with injection therapy for the neuroma, left foot.  RTC prn   Gardiner Barefoot DPM

## 2017-03-06 ENCOUNTER — Telehealth: Payer: Self-pay | Admitting: Endocrinology

## 2017-03-06 NOTE — Telephone Encounter (Signed)
Patient called and advised that she went to [facility] and they diagnosed her with 2 thyroid nodules and one of them has calcium. The patient stated that she was very anxious about getting scheduled as soon as possible with an endocrinologist with the possibility of the nodule being cancer.   I advised the patient that Endocrinology only goes by referral and that she can have the [facility] send a referral via fax as soon as possible.   The patient requested Dr. Loanne Drilling to be her endocrinologist and stated that she does have the CD of the ultrasound that [facility] provided her with.   Please be on stand by for this referral and place it as stat immediately. It is okay to leave a detailed message on the patient's phone when calling.

## 2017-03-13 NOTE — Telephone Encounter (Signed)
I checked the status of this referral today and spoke with Almyra Free who advised me that "she asked Urban Gibson and Urban Gibson stated that she thought Colletta Maryland had that referral, and Colletta Maryland is out today. Colletta Maryland may have given it to [place] already." Still awaiting a response for an update from [place].

## 2017-03-16 ENCOUNTER — Other Ambulatory Visit: Payer: Self-pay | Admitting: Internal Medicine

## 2017-03-16 NOTE — Telephone Encounter (Signed)
We have not received the pt's referral at this time. Patient states she has found out that the nodules are benign and no longer is in need of our help.

## 2017-03-16 NOTE — Telephone Encounter (Signed)
Can refill x 1    90 days has ppt for august yearly visit

## 2017-06-01 NOTE — Progress Notes (Deleted)
No chief complaint on file.   HPI: Patient  Alison Matthews  61 y.o. comes in today for Preventive Health Care visit  And Chronic disease management   Health Maintenance  Topic Date Due  . Hepatitis C Screening  Jul 16, 1956  . HIV Screening  03/26/1971  . PAP SMEAR  06/01/2017  . INFLUENZA VACCINE  05/06/2017  . MAMMOGRAM  12/09/2018  . TETANUS/TDAP  07/14/2021  . COLONOSCOPY  08/10/2023   Health Maintenance Review LIFESTYLE:  Exercise:   Tobacco/ETS: Alcohol:  Sugar beverages: Sleep: Drug use: no HH of  Work:    ROS:  GEN/ HEENT: No fever, significant weight changes sweats headaches vision problems hearing changes, CV/ PULM; No chest pain shortness of breath cough, syncope,edema  change in exercise tolerance. GI /GU: No adominal pain, vomiting, change in bowel habits. No blood in the stool. No significant GU symptoms. SKIN/HEME: ,no acute skin rashes suspicious lesions or bleeding. No lymphadenopathy, nodules, masses.  NEURO/ PSYCH:  No neurologic signs such as weakness numbness. No depression anxiety. IMM/ Allergy: No unusual infections.  Allergy .   REST of 12 system review negative except as per HPI   Past Medical History:  Diagnosis Date  . Breast pain    right  . Foot pain    bilateral  . H/O bone density study 2014   nl   . History of ETT    nl low exrcise capacity  . History of PFTs 2014   nl  . 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 Morton's neuroma   . PAF (paroxysmal atrial fibrillation) ([place]) 12/25/2011   During pregnancy 2012, s/p DCCV x 2   . Paroxysmal atrial fibrillation ([place])    During pregnancy 2012, s/p DCCV x 2  . Postmenopausal   . Shoulder pain, left   . Skin cancer of trunk 02/14/2012  . SVT (supraventricular tachycardia) ([place])   . Tingling     Past Surgical History:  Procedure Laterality Date  . CARDIAC CATHETERIZATION  12/24/11   cardiac ablation  . CATARACT EXTRACTION  2/10  . CESAREAN SECTION  10/10  .  CESAREAN SECTION  07/14/2011   Procedure: CESAREAN SECTION;  Surgeon: Daria Pastures;  Location: [facility];  Service: Gynecology;  Laterality: N/A;  Repeat  . HYSTEROSCOPY W/D&C N/A 01/17/2015   Procedure: DILATATION AND CURETTAGE /HYSTEROSCOPY/POLYPECTOMY;  Surgeon: Bobbye Charleston, MD;  Location: [facility];  Service: Gynecology;  Laterality: N/A;  . macular surgery    . PARS PLANA VITRECTOMY W/ REPAIR OF MACULAR HOLE  12/09  . POLYPECTOMY  9/09   Uterine  . SUPRAVENTRICULAR TACHYCARDIA ABLATION N/A 12/24/2011   Procedure: SUPRAVENTRICULAR TACHYCARDIA ABLATION;  Surgeon: Evans Lance, MD;  Location: [facility] CATH LAB;  Service: Cardiovascular;  Laterality: N/A;  . WISDOM TOOTH EXTRACTION  8/76    Family History  Problem Relation Age of Onset  . Hypertension Unknown   . Osteoporosis Unknown   . Arthritis Unknown   . Heart disease Father   . Breast cancer Maternal Aunt   . Diabetes Paternal Grandmother   . Colon cancer Neg Hx     Social History   Social History  . Marital status: Married    Spouse name: N/A  . Number of children: N/A  . Years of education: N/A   Social History Main Topics  . Smoking status: Never Smoker  . Smokeless tobacco: Never Used  . Alcohol use Yes     Comment:  2  DRINKS A MONTH  . Drug use: No  . Sexual activity: Yes   Other Topics Concern  . 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 aspirin 81 MG tablet Take 81 mg by mouth daily.    Marland Kitchen labetalol (NORMODYNE) 200 MG tablet TAKE 1 TABLET BY MOUTH TWICE DAILY 180 tablet 0  . levothyroxine (SYNTHROID, LEVOTHROID) 25 MCG tablet TAKE 1 TABLET BY MOUTH DAILY 90 tablet 0  .  omeprazole (PRILOSEC) 20 MG capsule Take 20 mg by mouth daily. OTC    . ondansetron (ZOFRAN ODT) 4 MG disintegrating tablet Take 1 tablet (4 mg total) by mouth every 8 (eight) hours as needed. 10 tablet 0  . rosuvastatin (CRESTOR) 20 MG tablet TAKE 1 TABLET(20 MG TOTAL) BY MOUTH DAILY 90 tablet 0   No facility-administered medications prior to visit.      EXAM:  There were no vitals taken for this visit.  There is no height or weight on file to calculate BMI. Wt Readings from Last 3 Encounters:  01/23/17 238 lb (108 kg)  12/18/16 238 lb 6.4 oz (108.1 kg)  04/01/16 234 lb (106.1 kg)    Physical Exam: Vital signs reviewed ZOX:WRUE is a well-developed well-nourished alert cooperative    who appearsr stated age in no acute distress.  HEENT: normocephalic atraumatic , Eyes: PERRL EOM's full, conjunctiva clear, Nares: paten,t no deformity discharge or tenderness., Ears: no deformity EAC's clear TMs with normal landmarks. Mouth: clear OP, no lesions, edema.  Moist mucous membranes. Dentition in adequate repair. NECK: supple without masses, thyromegaly or bruits. CHEST/PULM:  Clear to auscultation and percussion breath sounds equal no wheeze , rales or rhonchi. No chest wall deformities or tenderness. Breast: normal by inspection . No dimpling, discharge, masses, tenderness or discharge . CV: PMI is nondisplaced, S1 S2 no gallops, murmurs, rubs. Peripheral pulses are full without delay.No JVD .  ABDOMEN: Bowel sounds normal nontender  No guard or rebound, no hepato splenomegal no CVA tenderness.  No hernia. Extremtities:  No clubbing cyanosis or edema, no acute joint swelling or redness no focal atrophy NEURO:  Oriented x3, cranial nerves 3-12 appear to be intact, no obvious focal weakness,gait within normal limits no abnormal reflexes or asymmetrical SKIN: No acute rashes normal turgor, color, no bruising or petechiae. PSYCH: Oriented, good eye contact, no obvious depression anxiety,  cognition and judgment appear normal. LN: no cervical axillary inguinal adenopathy  Lab Results  Component Value Date   WBC 12.0 (H) 01/23/2017   HGB 13.6 01/23/2017   HCT 42.3 01/23/2017   PLT 177 01/23/2017   GLUCOSE 116 (H) 01/23/2017   CHOL 144 03/18/2016   TRIG 101.0 03/18/2016   HDL 48.20 03/18/2016   LDLDIRECT 149.6 01/11/2010   LDLCALC 75 03/18/2016   ALT 16 03/18/2016   AST 19 03/18/2016   NA 140 01/23/2017   K 4.0 01/23/2017   CL 105 01/23/2017   CREATININE 0.88 01/23/2017   BUN 18 01/23/2017   CO2 24 01/23/2017   TSH 2.54 03/18/2016  INR 0.9 04/08/2008   HGBA1C 5.7 04/01/2016    BP Readings from Last 3 Encounters:  01/23/17 (!) 116/59  12/18/16 110/70  04/01/16 126/72    Lab results reviewed with patient   ASSESSMENT AND PLAN:  Discussed the following assessment and plan:  Visit for preventive health examination  Essential hypertension  Hyperlipidemia, unspecified hyperlipidemia type  Fasting hyperglycemia  Medication management  Other specified hypothyroidism a1c and lipids tsh Patient Care Team: Burnis Medin, MD as PCP - General Evans Lance, MD (Cardiology) Bobbye Charleston, MD as Attending Physician (Obstetrics and Gynecology) Crista Luria, MD as Attending Physician (Dermatology) Griselda Miner, MD as Attending Physician (Dermatology) Viona Gilmore. Erick Alley M D  There are no Patient Instructions on file for this visit.  Standley Brooking. Leiann Sporer M.D.

## 2017-06-02 ENCOUNTER — Other Ambulatory Visit (INDEPENDENT_AMBULATORY_CARE_PROVIDER_SITE_OTHER): Payer: 59

## 2017-06-02 ENCOUNTER — Other Ambulatory Visit: Payer: Self-pay | Admitting: Emergency Medicine

## 2017-06-02 ENCOUNTER — Encounter: Payer: 59 | Admitting: Internal Medicine

## 2017-06-02 DIAGNOSIS — E039 Hypothyroidism, unspecified: Secondary | ICD-10-CM

## 2017-06-02 DIAGNOSIS — Z0000 Encounter for general adult medical examination without abnormal findings: Secondary | ICD-10-CM

## 2017-06-02 DIAGNOSIS — E785 Hyperlipidemia, unspecified: Secondary | ICD-10-CM

## 2017-06-02 DIAGNOSIS — I10 Essential (primary) hypertension: Secondary | ICD-10-CM

## 2017-06-02 DIAGNOSIS — Z79899 Other long term (current) drug therapy: Secondary | ICD-10-CM

## 2017-06-02 DIAGNOSIS — R739 Hyperglycemia, unspecified: Secondary | ICD-10-CM

## 2017-06-02 LAB — CBC WITH DIFFERENTIAL/PLATELET
Basophils Absolute: 0 10*3/uL (ref 0.0–0.1)
Basophils Relative: 0.7 % (ref 0.0–3.0)
Eosinophils Absolute: 0.1 10*3/uL (ref 0.0–0.7)
Eosinophils Relative: 2.1 % (ref 0.0–5.0)
HCT: 42.9 % (ref 36.0–46.0)
Hemoglobin: 13.8 g/dL (ref 12.0–15.0)
Lymphocytes Relative: 32 % (ref 12.0–46.0)
Lymphs Abs: 1.6 10*3/uL (ref 0.7–4.0)
MCHC: 32.2 g/dL (ref 30.0–36.0)
MCV: 89.9 fl (ref 78.0–100.0)
Monocytes Absolute: 0.5 10*3/uL (ref 0.1–1.0)
Monocytes Relative: 9.3 % (ref 3.0–12.0)
Neutro Abs: 2.7 10*3/uL (ref 1.4–7.7)
Neutrophils Relative %: 55.9 % (ref 43.0–77.0)
Platelets: 203 10*3/uL (ref 150.0–400.0)
RBC: 4.77 Mil/uL (ref 3.87–5.11)
RDW: 14.1 % (ref 11.5–15.5)
WBC: 4.9 10*3/uL (ref 4.0–10.5)

## 2017-06-02 LAB — TSH: TSH: 1.71 u[IU]/mL (ref 0.35–4.50)

## 2017-06-02 LAB — BASIC METABOLIC PANEL
BUN: 15 mg/dL (ref 6–23)
CO2: 29 mEq/L (ref 19–32)
Calcium: 9.4 mg/dL (ref 8.4–10.5)
Chloride: 107 mEq/L (ref 96–112)
Creatinine, Ser: 0.83 mg/dL (ref 0.40–1.20)
GFR: 74.23 mL/min (ref >60.00)
Glucose, Bld: 96 mg/dL (ref 70–99)
Potassium: 4.2 mEq/L (ref 3.5–5.1)
Sodium: 141 mEq/L (ref 135–145)

## 2017-06-02 LAB — LIPID PANEL
Cholesterol: 153 mg/dL (ref 0–200)
HDL: 45 mg/dL (ref >39.00)
LDL Cholesterol: 90 mg/dL (ref 0–99)
NonHDL: 107.83
Total CHOL/HDL Ratio: 3
Triglycerides: 88 mg/dL (ref 0.0–149.0)
VLDL: 17.6 mg/dL (ref 0.0–40.0)

## 2017-06-02 LAB — HEMOGLOBIN A1C: Hgb A1c MFr Bld: 6.1 % (ref 4.6–6.5)

## 2017-06-14 ENCOUNTER — Other Ambulatory Visit: Payer: Self-pay | Admitting: Internal Medicine

## 2017-06-17 NOTE — Progress Notes (Signed)
Chief Complaint  Patient presents with  . Annual Exam    HPI: Patient  Alison Matthews  61 y.o. comes in today for Preventive Health Care visit  And ,Chronic disease management To  do pap today as has been a while .   Was seen at [facility] clinic this summer and was unable to do the Pap smear at that time because of time. Thyroid ultrasound noted thyroid nodules that required eventually biopsy 2 on each side at [facility] which were fortunately benign. She's now will be followed at [facility] for these nodules. BP  is still on the labetalol because never got switched but has had a stress test and the cardiology she saw his advised to possibly cut the dose in half until she is finished moving and then medications can be changed. LIPIDS  no problem with medication Hx afib no recurrence by history.  The family is moving to a smaller resident's from [address] ft. house and she is looking forward to this downsizing intentional. However she is doing some moving. She was to have right wrist surgery to make her thumb work better but is still playing at until after the move.     Health Maintenance  Topic Date Due  . Hepatitis C Screening  [birth year]  . HIV Screening  03/26/1971  . INFLUENZA VACCINE  05/06/2017  . PAP SMEAR  06/01/2017  . MAMMOGRAM  12/09/2018  . TETANUS/TDAP  07/14/2021  . COLONOSCOPY  08/10/2023   Health Maintenance Review LIFESTYLE:  Exercise:  Active busty Tobacco/ETS:n Alcohol: ocass to rare Sugar beverages: ocass sprite Sleep: less in move  Drug use: no HH of 4  2 daughter 5 and 8  She has had the first showing Grix about 4 months ago. ROS:  GEN/ HEENT: No fever, significant weight changes sweats headaches vision problems hearing changes, CV/ PULM; No chest pain shortness of breath cough, syncope,edema  change in exercise tolerance. GI /GU: No adominal pain, vomiting, change in bowel habits. No blood in the stool. No significant GU symptoms. SKIN/HEME: ,no acute skin  rashes suspicious lesions or bleeding. No lymphadenopathy, nodules, masses.  NEURO/ PSYCH:  No neurologic signs such as weakness numbness. No depression anxiety. IMM/ Allergy: No unusual infections.  Allergy .   REST of 12 system review negative except as per HPI   Past Medical History:  Diagnosis Date  . Breast pain    right  . Foot pain    bilateral  . H/O bone density study 2014   nl   . History of ETT    nl low exrcise capacity  . History of PFTs 2014   nl  . 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 Morton's neuroma   . PAF (paroxysmal atrial fibrillation) ([place]) 12/25/2011   During pregnancy 2012, s/p DCCV x 2   . Paroxysmal atrial fibrillation ([place])    During pregnancy 2012, s/p DCCV x 2  . Postmenopausal   . Shoulder pain, left   . Skin cancer of trunk 02/14/2012  . SVT (supraventricular tachycardia) ([place])   . Tingling     Past Surgical History:  Procedure Laterality Date  . CARDIAC CATHETERIZATION  12/24/11   cardiac ablation  . CATARACT EXTRACTION  2/10  . CESAREAN SECTION  10/10  . CESAREAN SECTION  07/14/2011   Procedure: CESAREAN SECTION;  Surgeon: Daria Pastures;  Location: [facility];  Service: Gynecology;  Laterality: N/A;  Repeat  . HYSTEROSCOPY W/D&C N/A 01/17/2015  Procedure: DILATATION AND CURETTAGE /HYSTEROSCOPY/POLYPECTOMY;  Surgeon: Bobbye Charleston, MD;  Location: [facility];  Service: Gynecology;  Laterality: N/A;  . macular surgery    . PARS PLANA VITRECTOMY W/ REPAIR OF MACULAR HOLE  12/09  . POLYPECTOMY  9/09   Uterine  . SUPRAVENTRICULAR TACHYCARDIA ABLATION N/A 12/24/2011   Procedure: SUPRAVENTRICULAR TACHYCARDIA ABLATION;  Surgeon: Evans Lance, MD;  Location: [facility] CATH LAB;  Service: Cardiovascular;  Laterality: N/A;  . WISDOM TOOTH EXTRACTION  8/76    Family History  Problem Relation Age of Onset  . Hypertension Unknown   . Osteoporosis Unknown   . Arthritis Unknown   . Heart disease Father   . Breast cancer Maternal Aunt   .  Diabetes Paternal Grandmother   . Colon cancer Neg Hx     Social History   Social History  . Marital status: Married    Spouse name: N/A  . Number of children: N/A  . Years of education: N/A   Social History Main Topics  . Smoking status: Never Smoker  . Smokeless tobacco: Never Used  . Alcohol use Yes     Comment:  2 DRINKS A MONTH  . Drug use: No  . Sexual activity: Yes   Other Topics Concern  . 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 aspirin 81 MG tablet Take 81 mg by mouth daily.    Marland Kitchen labetalol (NORMODYNE) 200 MG tablet TAKE 1 TABLET BY MOUTH TWICE DAILY 180 tablet 0  . levothyroxine (SYNTHROID, LEVOTHROID) 25 MCG tablet TAKE 1 TABLET BY MOUTH DAILY 90 tablet 0  . omeprazole (PRILOSEC) 20 MG capsule Take 20 mg by mouth daily. OTC    . rosuvastatin (CRESTOR) 20 MG tablet TAKE 1 TABLET(20 MG TOTAL) BY MOUTH DAILY 90 tablet 0  . ondansetron (ZOFRAN ODT) 4 MG disintegrating tablet Take 1 tablet (4 mg total) by mouth every 8 (eight) hours as needed. (Patient not taking: Reported on 06/18/2017) 10 tablet 0   No facility-administered medications prior to visit.      EXAM:  BP 106/78 (BP Location: Right Arm, Patient Position: Sitting, Cuff Size: Normal)   Pulse 78   Temp 98.3 F (36.8 C) (Oral)   Ht 5' 5"  (1.651 m)   Wt 236 lb 9.6 oz (107.3 kg)   LMP 09/05/2010 (Approximate)   BMI 39.37 kg/m   Body mass index is 39.37 kg/m. Wt Readings from Last 3 Encounters:  06/18/17 236 lb 9.6 oz (107.3 kg)  01/23/17 238 lb (108 kg)  12/18/16 238 lb 6.4 oz (108.1 kg)    Physical Exam: Vital signs reviewed QAS:TMHD is a well-developed well-nourished  alert cooperative    who appearsr stated age in no acute distress.  HEENT: normocephalic atraumatic , Eyes: PERRL EOM's full, conjunctiva clear, Nares: paten,t no deformity discharge or tenderness., Ears: no deformity EAC's clear TMs with normal landmarks. Mouth: clear OP, no lesions, edema.  Moist mucous membranes. Dentition in adequate repair. NECK: supple without masses, thyromegaly or bruits. CHEST/PULM:  Clear to auscultation and percussion breath  sounds equal no wheeze , rales or rhonchi. No chest wall deformities or tenderness. Breast: normal by inspection . No dimpling, discharge, masses, tenderness or discharge . CV: PMI is nondisplaced, S1 S2 no gallops, murmurs, rubs. Peripheral pulses are full without delay.No JVD .  ABDOMEN: Bowel sounds normal nontender  No guard or rebound, no hepato splenomegal no CVA tenderness.  No hernia. Extremtities:  No clubbing cyanosis or edema, no acute joint swelling or redness no focal atrophy NEURO:  Oriented x3, cranial nerves 3-12 appear to be intact, no obvious focal weakness,gait within normal limits no abnormal reflexes or asymmetrical SKIN: No acute rashes normal turgor, color, no bruising or petechiae. PSYCH: Oriented, good eye contact, no obvious depression anxiety, cognition and judgment appear normal. LN: no cervical axillary inguinal adenopathy Pelvic: NL ext GU, labia clear without lesions or rash . Vagina no lesions .Cervix: clear  UTERUS: Neg CMT Adnexa:  clear no masses . PAP done high risk HPV.    Lab Results  Component Value Date   WBC 4.9 06/02/2017   HGB 13.8 06/02/2017   HCT 42.9 06/02/2017   PLT 203.0 06/02/2017   GLUCOSE 96 06/02/2017   CHOL 153 06/02/2017   TRIG 88.0 06/02/2017   HDL 45.00 06/02/2017   LDLDIRECT 149.6 01/11/2010   LDLCALC 90 06/02/2017   ALT 16 03/18/2016   AST 19 03/18/2016   NA 141 06/02/2017   K 4.2 06/02/2017   CL 107 06/02/2017   CREATININE 0.83 06/02/2017   BUN 15 06/02/2017   CO2 29  06/02/2017   TSH 1.71 06/02/2017   INR 0.9 04/08/2008   HGBA1C 6.1 06/02/2017    BP Readings from Last 3 Encounters:  06/18/17 106/78  01/23/17 (!) 116/59  12/18/16 110/70   Wt Readings from Last 3 Encounters:  06/18/17 236 lb 9.6 oz (107.3 kg)  01/23/17 238 lb (108 kg)  12/18/16 238 lb 6.4 oz (108.1 kg)    Lab results reviewed with patient   ASSESSMENT AND PLAN:  Discussed the following assessment and plan:  Visit for preventive health examination - Plan: Cytology - PAP  Medication management  Essential hypertension  Hyperlipidemia, unspecified hyperlipidemia type  Encounter for gynecological examination without abnormal finding - Plan: Cytology - PAP  Need for shingles vaccine - Plan: Varicella-zoster vaccine IM  Need for influenza vaccination - Plan: Flu Vaccine QUAD 6+ mos PF IM (Fluarix Quad PF)  Multiple thyroid nodules   Patient Care Team: Burnis Medin, MD as PCP - General Evans Lance, MD (Cardiology) Bobbye Charleston, MD as Attending Physician (Obstetrics and Gynecology) Crista Luria, MD as Attending Physician (Dermatology) Griselda Miner, MD as Attending Physician (Dermatology) Viona Gilmore. Erick Alley M D  Patient Instructions   Continue attention to  lifestyle intervention healthy eating and exercise .  Will notify you when pap results are available. Can decrease the labetolol to 1/2 dose as per Advice [facility].   If ok with cardiologist we can transition over to other meds for BP control .          Preventive Care 40-64 Years, Female Preventive care refers to lifestyle choices and visits with your health care provider that can promote health and wellness. What does preventive care include?  A yearly physical exam. This is also called an annual well check.  Dental exams once or twice a year.  Routine eye exams. Ask your health care provider how often you should have your eyes checked.  Personal lifestyle choices,  including: ? Daily  care of your teeth and gums. ? Regular physical activity. ? Eating a healthy diet. ? Avoiding tobacco and drug use. ? Limiting alcohol use. ? Practicing safe sex. ? Taking low-dose aspirin daily starting at age 47. ? Taking vitamin and mineral supplements as recommended by your health care provider. What happens during an annual well check? The services and screenings done by your health care provider during your annual well check will depend on your age, overall health, lifestyle risk factors, and family history of disease. Counseling Your health care provider may ask you questions about your:  Alcohol use.  Tobacco use.  Drug use.  Emotional well-being.  Home and relationship well-being.  Sexual activity.  Eating habits.  Work and work Statistician.  Method of birth control.  Menstrual cycle.  Pregnancy history.  Screening You may have the following tests or measurements:  Height, weight, and BMI.  Blood pressure.  Lipid and cholesterol levels. These may be checked every 5 years, or more frequently if you are over 49 years old.  Skin check.  Lung cancer screening. You may have this screening every year starting at age 48 if you have a 30-pack-year history of smoking and currently smoke or have quit within the past 15 years.  Fecal occult blood test (FOBT) of the stool. You may have this test every year starting at age 51.  Flexible sigmoidoscopy or colonoscopy. You may have a sigmoidoscopy every 5 years or a colonoscopy every 10 years starting at age 95.  Hepatitis C blood test.  Hepatitis B blood test.  Sexually transmitted disease (STD) testing.  Diabetes screening. This is done by checking your blood sugar (glucose) after you have not eaten for a while (fasting). You may have this done every 1-3 years.  Mammogram. This may be done every 1-2 years. Talk to your health care provider about when you should start having regular  mammograms. This may depend on whether you have a family history of breast cancer.  BRCA-related cancer screening. This may be done if you have a family history of breast, ovarian, tubal, or peritoneal cancers.  Pelvic exam and Pap test. This may be done every 3 years starting at age 51. Starting at age 37, this may be done every 5 years if you have a Pap test in combination with an HPV test.  Bone density scan. This is done to screen for osteoporosis. You may have this scan if you are at high risk for osteoporosis.  Discuss your test results, treatment options, and if necessary, the need for more tests with your health care provider. Vaccines Your health care provider may recommend certain vaccines, such as:  Influenza vaccine. This is recommended every year.  Tetanus, diphtheria, and acellular pertussis (Tdap, Td) vaccine. You may need a Td booster every 10 years.  Varicella vaccine. You may need this if you have not been vaccinated.  Zoster vaccine. You may need this after age 12.  Measles, mumps, and rubella (MMR) vaccine. You may need at least one dose of MMR if you were born in 1957 or later. You may also need a second dose.  Pneumococcal 13-valent conjugate (PCV13) vaccine. You may need this if you have certain conditions and were not previously vaccinated.  Pneumococcal polysaccharide (PPSV23) vaccine. You may need one or two doses if you smoke cigarettes or if you have certain conditions.  Meningococcal vaccine. You may need this if you have certain conditions.  Hepatitis A vaccine. You may need this if you  have certain conditions or if you travel or work in places where you may be exposed to hepatitis A.  Hepatitis B vaccine. You may need this if you have certain conditions or if you travel or work in places where you may be exposed to hepatitis B.  Haemophilus influenzae type b (Hib) vaccine. You may need this if you have certain conditions.  Talk to your health care  provider about which screenings and vaccines you need and how often you need them. This information is not intended to replace advice given to you by your health care provider. Make sure you discuss any questions you have with your health care provider. Document Released: 10/19/2015 Document Revised: 06/11/2016 Document Reviewed: 07/24/2015 Elsevier Interactive Patient Education  2017 [employer] K. Mckinzie Saksa M.D.

## 2017-06-18 ENCOUNTER — Ambulatory Visit (INDEPENDENT_AMBULATORY_CARE_PROVIDER_SITE_OTHER): Payer: 59 | Admitting: Internal Medicine

## 2017-06-18 ENCOUNTER — Encounter: Payer: Self-pay | Admitting: Internal Medicine

## 2017-06-18 ENCOUNTER — Other Ambulatory Visit (HOSPITAL_COMMUNITY)
Admission: RE | Admit: 2017-06-18 | Discharge: 2017-06-18 | Disposition: A | Discharge status: Home / Self Care | Payer: 59 | Source: Ambulatory Visit | Attending: Internal Medicine | Admitting: Internal Medicine

## 2017-06-18 VITALS — BP 106/78 | HR 78 | Temp 98.3°F | Ht 65.0 in | Wt 236.6 lb

## 2017-06-18 DIAGNOSIS — E785 Hyperlipidemia, unspecified: Secondary | ICD-10-CM | POA: Diagnosis not present

## 2017-06-18 DIAGNOSIS — E042 Nontoxic multinodular goiter: Secondary | ICD-10-CM

## 2017-06-18 DIAGNOSIS — Z0000 Encounter for general adult medical examination without abnormal findings: Secondary | ICD-10-CM | POA: Diagnosis not present

## 2017-06-18 DIAGNOSIS — I10 Essential (primary) hypertension: Secondary | ICD-10-CM

## 2017-06-18 DIAGNOSIS — Z23 Encounter for immunization: Secondary | ICD-10-CM | POA: Diagnosis not present

## 2017-06-18 DIAGNOSIS — Z01419 Encounter for gynecological examination (general) (routine) without abnormal findings: Secondary | ICD-10-CM | POA: Insufficient documentation

## 2017-06-18 DIAGNOSIS — Z79899 Other long term (current) drug therapy: Secondary | ICD-10-CM | POA: Diagnosis not present

## 2017-06-18 NOTE — Patient Instructions (Addendum)
Continue attention to  lifestyle intervention healthy eating and exercise .  Will notify you when pap results are available. Can decrease the labetolol to 1/2 dose as per Advice [facility].   If ok with cardiologist we can transition over to other meds for BP control .          Preventive Care 40-64 Years, Female Preventive care refers to lifestyle choices and visits with your health care provider that can promote health and wellness. What does preventive care include?  A yearly physical exam. This is also called an annual well check.  Dental exams once or twice a year.  Routine eye exams. Ask your health care provider how often you should have your eyes checked.  Personal lifestyle choices, including: ? Daily care of your teeth and gums. ? Regular physical activity. ? Eating a healthy diet. ? Avoiding tobacco and drug use. ? Limiting alcohol use. ? Practicing safe sex. ? Taking low-dose aspirin daily starting at age 85. ? Taking vitamin and mineral supplements as recommended by your health care provider. What happens during an annual well check? The services and screenings done by your health care provider during your annual well check will depend on your age, overall health, lifestyle risk factors, and family history of disease. Counseling Your health care provider may ask you questions about your:  Alcohol use.  Tobacco use.  Drug use.  Emotional well-being.  Home and relationship well-being.  Sexual activity.  Eating habits.  Work and work Statistician.  Method of birth control.  Menstrual cycle.  Pregnancy history.  Screening You may have the following tests or measurements:  Height, weight, and BMI.  Blood pressure.  Lipid and cholesterol levels. These may be checked every 5 years, or more frequently if you are over 54 years old.  Skin check.  Lung cancer screening. You may have this screening every year starting at age 14 if you have a  30-pack-year history of smoking and currently smoke or have quit within the past 15 years.  Fecal occult blood test (FOBT) of the stool. You may have this test every year starting at age 44.  Flexible sigmoidoscopy or colonoscopy. You may have a sigmoidoscopy every 5 years or a colonoscopy every 10 years starting at age 62.  Hepatitis C blood test.  Hepatitis B blood test.  Sexually transmitted disease (STD) testing.  Diabetes screening. This is done by checking your blood sugar (glucose) after you have not eaten for a while (fasting). You may have this done every 1-3 years.  Mammogram. This may be done every 1-2 years. Talk to your health care provider about when you should start having regular mammograms. This may depend on whether you have a family history of breast cancer.  BRCA-related cancer screening. This may be done if you have a family history of breast, ovarian, tubal, or peritoneal cancers.  Pelvic exam and Pap test. This may be done every 3 years starting at age 51. Starting at age 85, this may be done every 5 years if you have a Pap test in combination with an HPV test.  Bone density scan. This is done to screen for osteoporosis. You may have this scan if you are at high risk for osteoporosis.  Discuss your test results, treatment options, and if necessary, the need for more tests with your health care provider. Vaccines Your health care provider may recommend certain vaccines, such as:  Influenza vaccine. This is recommended every year.  Tetanus, diphtheria, and  acellular pertussis (Tdap, Td) vaccine. You may need a Td booster every 10 years.  Varicella vaccine. You may need this if you have not been vaccinated.  Zoster vaccine. You may need this after age 66.  Measles, mumps, and rubella (MMR) vaccine. You may need at least one dose of MMR if you were born in 1957 or later. You may also need a second dose.  Pneumococcal 13-valent conjugate (PCV13) vaccine. You may  need this if you have certain conditions and were not previously vaccinated.  Pneumococcal polysaccharide (PPSV23) vaccine. You may need one or two doses if you smoke cigarettes or if you have certain conditions.  Meningococcal vaccine. You may need this if you have certain conditions.  Hepatitis A vaccine. You may need this if you have certain conditions or if you travel or work in places where you may be exposed to hepatitis A.  Hepatitis B vaccine. You may need this if you have certain conditions or if you travel or work in places where you may be exposed to hepatitis B.  Haemophilus influenzae type b (Hib) vaccine. You may need this if you have certain conditions.  Talk to your health care provider about which screenings and vaccines you need and how often you need them. This information is not intended to replace advice given to you by your health care provider. Make sure you discuss any questions you have with your health care provider. Document Released: 10/19/2015 Document Revised: 06/11/2016 Document Reviewed: 07/24/2015 Elsevier Interactive Patient Education  2017 [employer].

## 2017-06-18 NOTE — Assessment & Plan Note (Signed)
Can decrease labetalol to half dose and then when things are settled contact us or her other providers about change in blood pressure medication.

## 2017-06-23 LAB — CYTOLOGY - PAP
Diagnosis: NEGATIVE
HPV: NOT DETECTED

## 2017-06-23 NOTE — Progress Notes (Signed)
PAP is normal. HPV high  risk is negative

## 2017-06-26 ENCOUNTER — Encounter: Payer: Self-pay | Admitting: Internal Medicine

## 2017-09-08 ENCOUNTER — Other Ambulatory Visit: Payer: Self-pay | Admitting: Internal Medicine

## 2017-09-16 ENCOUNTER — Ambulatory Visit (INDEPENDENT_AMBULATORY_CARE_PROVIDER_SITE_OTHER): Payer: 59 | Admitting: Family Medicine

## 2017-09-16 ENCOUNTER — Encounter: Payer: Self-pay | Admitting: Family Medicine

## 2017-09-16 VITALS — BP 110/70 | HR 78 | Temp 97.8°F | Wt 233.9 lb

## 2017-09-16 DIAGNOSIS — B9789 Other viral agents as the cause of diseases classified elsewhere: Secondary | ICD-10-CM

## 2017-09-16 DIAGNOSIS — J069 Acute upper respiratory infection, unspecified: Secondary | ICD-10-CM

## 2017-09-16 MED ORDER — FLUTICASONE PROPIONATE 50 MCG/ACT NA SUSP: 2.0000 | Freq: Every day | NASAL | 6 refills | Status: DC

## 2017-09-16 NOTE — Patient Instructions (Addendum)
I have sent in a prescription for flonase nasal spray to your pharmacy.    You can use Coricidin HBP or  Delsym cough syrup for your cough.  It is available over the counter at the pharmacy. Cough, Adult Coughing is a reflex that clears your throat and your airways. Coughing helps to heal and protect your lungs. It is normal to cough occasionally, but a cough that happens with other symptoms or lasts a long time may be a sign of a condition that needs treatment. A cough may last only 2-3 weeks (acute), or it may last longer than 8 weeks (chronic). What are the causes? Coughing is commonly caused by:  Breathing in substances that irritate your lungs.  A viral or bacterial respiratory infection.  Allergies.  Asthma.  Postnasal drip.  Smoking.  Acid backing up from the stomach into the esophagus (gastroesophageal reflux).  Certain medicines.  Chronic lung problems, including COPD (or rarely, lung cancer).  Other medical conditions such as heart failure.  Follow these instructions at home: Pay attention to any changes in your symptoms. Take these actions to help with your discomfort:  Take medicines only as told by your health care provider. ? If you were prescribed an antibiotic medicine, take it as told by your health care provider. Do not stop taking the antibiotic even if you start to feel better. ? Talk with your health care provider before you take a cough suppressant medicine.  Drink enough fluid to keep your urine clear or pale yellow.  If the air is dry, use a cold steam vaporizer or humidifier in your bedroom or your home to help loosen secretions.  Avoid anything that causes you to cough at work or at home.  If your cough is worse at night, try sleeping in a semi-upright position.  Avoid cigarette smoke. If you smoke, quit smoking. If you need help quitting, ask your health care provider.  Avoid caffeine.  Avoid alcohol.  Rest as needed.  Contact a health  care provider if:  You have new symptoms.  You cough up pus.  Your cough does not get better after 2-3 weeks, or your cough gets worse.  You cannot control your cough with suppressant medicines and you are losing sleep.  You develop pain that is getting worse or pain that is not controlled with pain medicines.  You have a fever.  You have unexplained weight loss.  You have night sweats. Get help right away if:  You cough up blood.  You have difficulty breathing.  Your heartbeat is very fast. This information is not intended to replace advice given to you by your health care provider. Make sure you discuss any questions you have with your health care provider. Document Released: 03/21/2011 Document Revised: 02/28/2016 Document Reviewed: 11/29/2014 Elsevier Interactive Patient Education  2017 [employer].  Upper Respiratory Infection, Adult Most upper respiratory infections (URIs) are a viral infection of the air passages leading to the lungs. A URI affects the nose, throat, and upper air passages. The most common type of URI is nasopharyngitis and is typically referred to as "the common cold." URIs run their course and usually go away on their own. Most of the time, a URI does not require medical attention, but sometimes a bacterial infection in the upper airways can follow a viral infection. This is called a secondary infection. Sinus and middle ear infections are common types of secondary upper respiratory infections. Bacterial pneumonia can also complicate a URI. A URI  can worsen asthma and chronic obstructive pulmonary disease (COPD). Sometimes, these complications can require emergency medical care and may be life threatening. What are the causes? Almost all URIs are caused by viruses. A virus is a type of germ and can spread from one person to another. What increases the risk? You may be at risk for a URI if:  You smoke.  You have chronic heart or lung disease.  You  have a weakened defense (immune) system.  You are very young or very old.  You have nasal allergies or asthma.  You work in crowded or poorly ventilated areas.  You work in health care facilities or schools.  What are the signs or symptoms? Symptoms typically develop 2-3 days after you come in contact with a cold virus. Most viral URIs last 7-10 days. However, viral URIs from the influenza virus (flu virus) can last 14-18 days and are typically more severe. Symptoms may include:  Runny or stuffy (congested) nose.  Sneezing.  Cough.  Sore throat.  Headache.  Fatigue.  Fever.  Loss of appetite.  Pain in your forehead, behind your eyes, and over your cheekbones (sinus pain).  Muscle aches.  How is this diagnosed? Your health care provider may diagnose a URI by:  Physical exam.  Tests to check that your symptoms are not due to another condition such as: ? Strep throat. ? Sinusitis. ? Pneumonia. ? Asthma.  How is this treated? A URI goes away on its own with time. It cannot be cured with medicines, but medicines may be prescribed or recommended to relieve symptoms. Medicines may help:  Reduce your fever.  Reduce your cough.  Relieve nasal congestion.  Follow these instructions at home:  Take medicines only as directed by your health care provider.  Gargle warm saltwater or take cough drops to comfort your throat as directed by your health care provider.  Use a warm mist humidifier or inhale steam from a shower to increase air moisture. This may make it easier to breathe.  Drink enough fluid to keep your urine clear or pale yellow.  Eat soups and other clear broths and maintain good nutrition.  Rest as needed.  Return to work when your temperature has returned to normal or as your health care provider advises. You may need to stay home longer to avoid infecting others. You can also use a face mask and careful hand washing to prevent spread of the  virus.  Increase the usage of your inhaler if you have asthma.  Do not use any tobacco products, including cigarettes, chewing tobacco, or electronic cigarettes. If you need help quitting, ask your health care provider. How is this prevented? The best way to protect yourself from getting a cold is to practice good hygiene.  Avoid oral or hand contact with people with cold symptoms.  Wash your hands often if contact occurs.  There is no clear evidence that vitamin C, vitamin E, echinacea, or exercise reduces the chance of developing a cold. However, it is always recommended to get plenty of rest, exercise, and practice good nutrition. Contact a health care provider if:  You are getting worse rather than better.  Your symptoms are not controlled by medicine.  You have chills.  You have worsening shortness of breath.  You have brown or red mucus.  You have yellow or brown nasal discharge.  You have pain in your face, especially when you bend forward.  You have a fever.  You have swollen neck  glands.  You have pain while swallowing.  You have white areas in the back of your throat. Get help right away if:  You have severe or persistent: ? Headache. ? Ear pain. ? Sinus pain. ? Chest pain.  You have chronic lung disease and any of the following: ? Wheezing. ? Prolonged cough. ? Coughing up blood. ? A change in your usual mucus.  You have a stiff neck.  You have changes in your: ? Vision. ? Hearing. ? Thinking. ? Mood. This information is not intended to replace advice given to you by your health care provider. Make sure you discuss any questions you have with your health care provider. Document Released: 03/18/2001 Document Revised: 05/25/2016 Document Reviewed: 12/28/2013 Elsevier Interactive Patient Education  2017 [employer].

## 2017-09-16 NOTE — Progress Notes (Signed)
Subjective:    Patient ID: [name], female    DOB: [birth year], 61 y.o.   MRN: [id number]  Chief Complaint  Patient presents with  . Cough  . Nasal Congestion    chest congestion    HPI Patient was seen today for acute concern.  Cough, nasal congestion, chest congestion, rhinorrhea, slightly sore throat, hoarse voice, and post nasal drainage since Sunday.  Pt tried chloraseptic lozenges.  Pt concerned she will not be well enough to fly to [place] on Saturday for 2 wks.  Past Medical History:  Diagnosis Date  . Breast pain    right  . Foot pain    bilateral  . H/O bone density study 2014   nl   . History of ETT    nl low exrcise capacity  . History of PFTs 2014   nl  . 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 Morton's neuroma   . PAF (paroxysmal atrial fibrillation) ([place]) 12/25/2011   During pregnancy 2012, s/p DCCV x 2   . Paroxysmal atrial fibrillation ([place])    During pregnancy 2012, s/p DCCV x 2  . Postmenopausal   . Shoulder pain, left   . Skin cancer of trunk 02/14/2012  . SVT (supraventricular tachycardia) ([place])   . Tingling     Allergies  Allergen Reactions  . Latex Itching    ROS General: Denies fever, chills, night sweats, changes in weight, changes in appetite HEENT: Denies headaches, ear pain, changes in vision,  +nasal congestion, post nasal drainage, rhinorrhea, sore throat CV: Denies CP, palpitations, SOB, orthopnea Pulm: Denies SOB, wheezing   +cough, chest congestion GI: Denies abdominal pain, nausea, vomiting, diarrhea, constipation GU: Denies dysuria, hematuria, frequency, vaginal discharge Msk: Denies muscle cramps, joint pains Neuro: Denies weakness, numbness, tingling Skin: Denies rashes, bruising Psych: Denies depression, anxiety, hallucinations     Objective:    Blood pressure 110/70, pulse 78, temperature 97.8 F (36.6 C), temperature source Oral, weight 233 lb 14.4 oz (106.1 kg), last menstrual period 09/05/2010, SpO2  98 %.   Gen. Pleasant, well-nourished, in no distress, normal affect HEENT: [place]/AT, face symmetric, conjunctiva clear, no scleral icterus, PERRLA, EOMI, nares patent with clear drainage, pharynx with mild erythema and postnasal drainage, no exudate. Lungs: no accessory muscle use, CTAB, no wheezes or rales Cardiovascular: RRR, no m/r/g, no peripheral edema Musculoskeletal: No deformities, no cyanosis or clubbing, normal tone Neuro:  A&Ox3, CN II-XII intact, normal gait Skin:  Warm, no lesions/ rash   Wt Readings from Last 3 Encounters:  09/16/17 233 lb 14.4 oz (106.1 kg)  06/18/17 236 lb 9.6 oz (107.3 kg)  01/23/17 238 lb (108 kg)    Lab Results  Component Value Date   WBC 4.9 06/02/2017   HGB 13.8 06/02/2017   HCT 42.9 06/02/2017   PLT 203.0 06/02/2017   GLUCOSE 96 06/02/2017   CHOL 153 06/02/2017   TRIG 88.0 06/02/2017   HDL 45.00 06/02/2017   LDLDIRECT 149.6 01/11/2010   LDLCALC 90 06/02/2017   ALT 16 03/18/2016   AST 19 03/18/2016   NA 141 06/02/2017   K 4.2 06/02/2017   CL 107 06/02/2017   CREATININE 0.83 06/02/2017   BUN 15 06/02/2017   CO2 29 06/02/2017   TSH 1.71 06/02/2017   INR 0.9 04/08/2008   HGBA1C 6.1 06/02/2017    Assessment/Plan:  Viral URI with cough  -Supportive care. -Ok to use warm salt water or chloraseptic spray for Sore throat. -Given pt's  HTN, advised to use Coricidin HBP or similar formulation cold medicine if needed. -Can use Delsym for cough -Plan: fluticasone (FLONASE) 50 MCG/ACT nasal spray -Pt to contact clinic on Fri if feeling worse or develops fever, etc.  F/u prn.

## 2018-05-06 ENCOUNTER — Other Ambulatory Visit: Payer: Self-pay | Admitting: Internal Medicine

## 2018-12-01 ENCOUNTER — Other Ambulatory Visit: Payer: Self-pay | Admitting: Internal Medicine

## 2018-12-06 NOTE — Progress Notes (Signed)
Chief Complaint  Patient presents with  . Annual Exam    pt is healthy and well has no concerns today     HPI: Patient  Alison Matthews  63 y.o. comes in today for Preventive Health Care visit  Medicine refill s   BP  : getting better  saxenda and weight loss   Down to 50 big of  labetalol  And reading at home in the 110 range     had right thumb surgery   Bone on bone  Bone removeal  Ok.  Elevated IRC:VELFYBO     Trial  For about  8-9 months   Without exercise .     Lost 25 pounds   Down to 2  Then work  Issues many things  Going on and not eating as healthy gaine some back   asks other advice   mng had bx neg and followed   Health Maintenance  Topic Date Due  . Hepatitis C Screening  11-Jan-1956  . HIV Screening  03/26/1971  . MAMMOGRAM  12/09/2018  . PAP SMEAR-Modifier  06/18/2020  . TETANUS/TDAP  07/14/2021  . COLONOSCOPY  08/10/2023  . 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    Home maker and  Working  And  Other endeavors   ROS:  GEN/ HEENT: No fever, significant weight changes sweats headaches vision problems hearing changes, CV/ PULM; No chest pain shortness of breath cough, syncope,edema  change in exercise tolerance. GI /GU: No adominal pain, vomiting, change in bowel habits. No blood in the stool. No significant GU symptoms. SKIN/HEME: ,no acute skin rashes suspicious lesions or bleeding. No lymphadenopathy, nodules, masses.  NEURO/ PSYCH:  No neurologic signs such as weakness numbness. No depression anxiety. IMM/ Allergy: No unusual infections.  Allergy .   REST of 12 system review negative except as per HPI   Past Medical History:  Diagnosis Date  . Breast pain    right  . Foot pain    bilateral  . H/O bone density study 2014   nl     . History of ETT    nl low exrcise capacity  . History of PFTs 2014   nl  . 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 Morton's neuroma   . PAF (paroxysmal atrial fibrillation) ([place]) 12/25/2011   During pregnancy 2012, s/p DCCV x 2   . Paroxysmal atrial fibrillation ([place])    During pregnancy 2012, s/p DCCV x 2  . Postmenopausal   . Shoulder pain, left   . Skin cancer of trunk 02/14/2012  . SVT (supraventricular tachycardia) ([place])   . Tingling     Past Surgical History:  Procedure Laterality Date  . CARDIAC CATHETERIZATION  12/24/11   cardiac ablation  . CATARACT EXTRACTION  2/10  . CESAREAN SECTION  10/10  . CESAREAN SECTION  07/14/2011   Procedure: CESAREAN SECTION;  Surgeon: Daria Pastures;  Location: [facility];  Service: Gynecology;  Laterality: N/A;  Repeat  . HYSTEROSCOPY W/D&C N/A 01/17/2015   Procedure: DILATATION AND CURETTAGE /HYSTEROSCOPY/POLYPECTOMY;  Surgeon: Bobbye Charleston, MD;  Location: [facility];  Service: Gynecology;  Laterality: N/A;  .  macular surgery    . PARS PLANA VITRECTOMY W/ REPAIR OF MACULAR HOLE  12/09  . POLYPECTOMY  9/09   Uterine  . SUPRAVENTRICULAR TACHYCARDIA ABLATION N/A 12/24/2011   Procedure: SUPRAVENTRICULAR TACHYCARDIA ABLATION;  Surgeon: Evans Lance, MD;  Location: [facility] CATH LAB;  Service: Cardiovascular;  Laterality: N/A;  . WISDOM TOOTH EXTRACTION  8/76    Family History  Problem Relation Age of Onset  . Hypertension Unknown   . Osteoporosis Unknown   . Arthritis Unknown   . Heart disease Father   . Breast cancer Maternal Aunt   . Diabetes Paternal Grandmother   . Colon cancer Neg Hx     Social History   Socioeconomic History  . Marital status: Married    Spouse name: Not on file  . Number of children: Not on file  . Years of education: Not on file  . Highest education level: Not on file  Occupational History  . Not on file  Social Needs  . Financial resource strain: Not on file  . Food insecurity:    Worry: Not on  file    Inability: Not on file  . Transportation needs:    Medical: Not on file    Non-medical: Not on file  Tobacco Use  . Smoking status: Never Smoker  . Smokeless tobacco: Never Used  Substance and Sexual Activity  . Alcohol use: Yes    Comment:  2 DRINKS A MONTH  . Drug use: No  . Sexual activity: Yes  Lifestyle  . Physical activity:    Days per week: Not on file    Minutes per session: Not on file  . Stress: Not on file  Relationships  . Social connections:    Talks on phone: Not on file    Gets together: Not on file    Attends religious service: Not on file    Active member of club or organization: Not on file    Attends meetings of clubs or organizations: Not on file    Relationship status: Not on file  Other Topics Concern  . 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 aspirin 81 MG tablet Take 81 mg by mouth daily.    Marland Kitchen labetalol (NORMODYNE) 100 MG tablet TAKE 1 TABLET BY MOUTH TWICE DAILY (Patient taking differently: 50 mg. 1/2 tablet twice daily) 30 tablet 0  . levothyroxine (SYNTHROID, LEVOTHROID) 25 MCG tablet TAKE 1 TABLET BY MOUTH DAILY 30 tablet 0  . Liraglutide -Weight Management (SAXENDA) 18 MG/3ML SOPN Saxenda 3 mg/0.5 mL (18 mg/3 mL) subcutaneous pen injector    . omeprazole (PRILOSEC) 20 MG capsule Take 20 mg by mouth daily. OTC    . rosuvastatin (CRESTOR) 20 MG tablet TAKE 1 TABLET(20 MG TOTAL) BY MOUTH DAILY 30 tablet 0  . fluticasone (FLONASE) 50 MCG/ACT nasal spray Place 2 sprays into both nostrils daily. (Patient not taking: Reported on 12/07/2018) 16 g 6   No facility-administered medications prior to visit.       EXAM:  BP 122/66 (BP Location:  Right Arm, Patient Position: Sitting, Cuff Size: Large)   Pulse 91   Temp 98.1 F (36.7 C) (Oral)   Ht 5' 5.5" (1.664 m)   Wt 229 lb 3.2 oz (104 kg)   LMP 09/05/2010 (Approximate)   BMI 37.56 kg/m   Body mass index is 37.56 kg/m. Wt Readings from Last 3 Encounters:  12/07/18 229 lb 3.2 oz (104 kg)  09/16/17 233 lb 14.4 oz (106.1 kg)  06/18/17 236 lb 9.6 oz (107.3 kg)    Physical Exam: Vital signs reviewed KPV:VZSM is a well-developed well-nourished alert cooperative    who appearsr stated age in no acute distress.  HEENT: normocephalic atraumatic , Eyes: PERRL EOM's full, conjunctiva clear, Nares: paten,t no deformity discharge or tenderness., Ears: no deformity EAC's clear TMs with normal landmarks. Mouth: clear OP, no lesions, edema.  Moist mucous membranes. Dentition in adequate repair. NECK: supple without masses, thyromegaly or bruits. CHEST/PULM:  Clear to auscultation and percussion breath sounds equal no wheeze , rales or rhonchi. No chest wall deformities or tenderness. Breast: normal by inspection . No dimpling, discharge, masses, tenderness or discharge . CV: PMI is nondisplaced, S1 S2 no gallops, murmurs, rubs. Peripheral pulses are full without delay.No JVD.  ABDOMEN: Bowel sounds normal nontender  No guard or rebound, no hepato splenomegal no CVA tenderness.   Extremtities:  No clubbing cyanosis or edema, no acute joint swelling or redness no focal atrophy NEURO:  Oriented x3, cranial nerves 3-12 appear to be intact, no obvious focal weakness,gait within normal limits no abnormal reflexes or asymmetrical SKIN: No acute rashes normal turgor, color, no bruising or petechiae. PSYCH: Oriented, good eye contact, no obvious depression anxiety, cognition and judgment appear normal. LN: no cervical axillary inguinal adenopathy  Lab Results  Component Value Date   WBC 4.5 12/07/2018   HGB 13.8 12/07/2018   HCT 42.4 12/07/2018   PLT  180.0 12/07/2018   GLUCOSE 77 12/07/2018   CHOL 145 12/07/2018   TRIG 92.0 12/07/2018   HDL 52.80 12/07/2018   LDLDIRECT 149.6 01/11/2010   LDLCALC 74 12/07/2018   ALT 20 12/07/2018   AST 23 12/07/2018   NA 141 12/07/2018   K 4.4 12/07/2018   CL 106 12/07/2018   CREATININE 0.74 12/07/2018   BUN 11 12/07/2018   CO2 27 12/07/2018   TSH 1.21 12/07/2018   INR 0.9 04/08/2008   HGBA1C 5.8 12/07/2018    BP Readings from Last 3 Encounters:  12/07/18 122/66  09/16/17 110/70  06/18/17 106/78   Wt Readings from Last 3 Encounters:  12/07/18 229 lb 3.2 oz (104 kg)  09/16/17 233 lb 14.4 oz (106.1 kg)  06/18/17 236 lb 9.6 oz (107.3 kg)     Lab plan  reviewed with patient .     ASSESSMENT AND PLAN:  Discussed the following assessment and plan:  Visit for preventive health examination - Plan: Basic metabolic panel, CBC with Differential/Platelet, Hemoglobin A1c, Hepatic function panel, Lipid panel, TSH, T4, free  Medication management - Plan: Basic metabolic panel, CBC with Differential/Platelet, Hemoglobin A1c, Hepatic function panel, Lipid panel, TSH, T4, free  Hypothyroidism, unspecified type - MNG  bx   per [facility]  - Plan: Basic metabolic panel, CBC with Differential/Platelet, Hemoglobin A1c, Hepatic function panel, Lipid panel, TSH, T4, free  Agatston coronary artery calcium score between 100 and 199 - Plan: Basic metabolic panel, CBC with Differential/Platelet, Hemoglobin A1c, Hepatic function panel, Lipid panel, TSH, T4, free  Multiple thyroid nodules - Plan: Basic metabolic panel,  CBC with Differential/Platelet, Hemoglobin A1c, Hepatic function panel, Lipid panel, TSH, T4, free  Hyperglycemia - Plan: Basic metabolic panel, CBC with Differential/Platelet, Hemoglobin A1c, Hepatic function panel, Lipid panel, TSH, T4, free  Essential hypertension - Plan: Basic metabolic panel, CBC with Differential/Platelet, Hemoglobin A1c, Hepatic function panel, Lipid panel, TSH, T4,  free  Need for hepatitis C screening test - Plan: Hepatitis C antibody  Screening for HIV (human immunodeficiency virus) - Plan: HIV Antibody (routine testing w rflx) disc bmi and   Medications   At this time weaning down ofn labetalol and my be able to transition and  Go to a different medication  if needed . Continued weigh loss may help  Counseled.  Patient Care Team: Burnis Medin, MD as PCP - General Evans Lance, MD (Cardiology) Bobbye Charleston, MD as Attending Physician (Obstetrics and Gynecology) Griselda Miner, MD as Attending Physician (Dermatology) Viona Gilmore. Dwan Bolt D  Patient Instructions  Continue  Attention to lifestyle intervention healthy eating and exercise . Stay on BP med for now until getting back down on the   Eating better .    Tr acking   Plans for  When you are busy  .  Nothing out of bag 'plan which fast foods you can get in a pinch   Ex arbys french dip 510 calories   Chick filet grilled chicken 310     Preventive Care 40-64 Years, Female Preventive care refers to lifestyle choices and visits with your health care provider that can promote health and wellness. What does preventive care include?   A yearly physical exam. This is also called an annual well check.  Dental exams once or twice a year.  Routine eye exams. Ask your health care provider how often you should have your eyes checked.  Personal lifestyle choices, including: ? Daily care of your teeth and gums. ? Regular physical activity. ? Eating a healthy diet. ? Avoiding tobacco and drug use. ? Limiting alcohol use. ? Practicing safe sex. ? Taking low-dose aspirin daily starting at age 57. ? Taking vitamin and mineral supplements as recommended by your health care provider. What happens during an annual well check? The services and screenings done by your health care provider during your annual well check will depend on your age, overall health, lifestyle risk factors, and family  history of disease. Counseling Your health care provider may ask you questions about your:  Alcohol use.  Tobacco use.  Drug use.  Emotional well-being.  Home and relationship well-being.  Sexual activity.  Eating habits.  Work and work Statistician.  Method of birth control.  Menstrual cycle.  Pregnancy history. Screening You may have the following tests or measurements:  Height, weight, and BMI.  Blood pressure.  Lipid and cholesterol levels. These may be checked every 5 years, or more frequently if you are over 50 years old.  Skin check.  Lung cancer screening. You may have this screening every year starting at age 35 if you have a 30-pack-year history of smoking and currently smoke or have quit within the past 15 years.  Colorectal cancer screening. All adults should have this screening starting at age 64 and continuing until age 65. Your health care provider may recommend screening at age 46. You will have tests every 1-10 years, depending on your results and the type of screening test. People at increased risk should start screening at an earlier age. Screening tests may include: ? Guaiac-based fecal occult blood testing. ? Fecal  immunochemical test (FIT). ? Stool DNA test. ? Virtual colonoscopy. ? Sigmoidoscopy. During this test, a flexible tube with a tiny camera (sigmoidoscope) is used to examine your rectum and lower colon. The sigmoidoscope is inserted through your anus into your rectum and lower colon. ? Colonoscopy. During this test, a long, thin, flexible tube with a tiny camera (colonoscope) is used to examine your entire colon and rectum.  Hepatitis C blood test.  Hepatitis B blood test.  Sexually transmitted disease (STD) testing.  Diabetes screening. This is done by checking your blood sugar (glucose) after you have not eaten for a while (fasting). You may have this done every 1-3 years.  Mammogram. This may be done every 1-2 years. Talk to your  health care provider about when you should start having regular mammograms. This may depend on whether you have a family history of breast cancer.  BRCA-related cancer screening. This may be done if you have a family history of breast, ovarian, tubal, or peritoneal cancers.  Pelvic exam and Pap test. This may be done every 3 years starting at age 53. Starting at age 88, this may be done every 5 years if you have a Pap test in combination with an HPV test.  Bone density scan. This is done to screen for osteoporosis. You may have this scan if you are at high risk for osteoporosis. Discuss your test results, treatment options, and if necessary, the need for more tests with your health care provider. Vaccines Your health care provider may recommend certain vaccines, such as:  Influenza vaccine. This is recommended every year.  Tetanus, diphtheria, and acellular pertussis (Tdap, Td) vaccine. You may need a Td booster every 10 years.  Varicella vaccine. You may need this if you have not been vaccinated.  Zoster vaccine. You may need this after age 58.  Measles, mumps, and rubella (MMR) vaccine. You may need at least one dose of MMR if you were born in 1957 or later. You may also need a second dose.  Pneumococcal 13-valent conjugate (PCV13) vaccine. You may need this if you have certain conditions and were not previously vaccinated.  Pneumococcal polysaccharide (PPSV23) vaccine. You may need one or two doses if you smoke cigarettes or if you have certain conditions.  Meningococcal vaccine. You may need this if you have certain conditions.  Hepatitis A vaccine. You may need this if you have certain conditions or if you travel or work in places where you may be exposed to hepatitis A.  Hepatitis B vaccine. You may need this if you have certain conditions or if you travel or work in places where you may be exposed to hepatitis B.  Haemophilus influenzae type b (Hib) vaccine. You may need this if  you have certain conditions. Talk to your health care provider about which screenings and vaccines you need and how often you need them. This information is not intended to replace advice given to you by your health care provider. Make sure you discuss any questions you have with your health care provider. Document Released: 10/19/2015 Document Revised: 11/12/2017 Document Reviewed: 07/24/2015 Elsevier Interactive Patient Education  2019 Franky Macho K. Ashawnti Tangen M.D.

## 2018-12-07 ENCOUNTER — Ambulatory Visit (INDEPENDENT_AMBULATORY_CARE_PROVIDER_SITE_OTHER): Payer: No Typology Code available for payment source | Admitting: Internal Medicine

## 2018-12-07 ENCOUNTER — Encounter: Payer: Self-pay | Admitting: Internal Medicine

## 2018-12-07 VITALS — BP 122/66 | HR 91 | Temp 98.1°F | Ht 65.5 in | Wt 229.2 lb

## 2018-12-07 DIAGNOSIS — Z114 Encounter for screening for human immunodeficiency virus [HIV]: Secondary | ICD-10-CM

## 2018-12-07 DIAGNOSIS — Z79899 Other long term (current) drug therapy: Secondary | ICD-10-CM

## 2018-12-07 DIAGNOSIS — E039 Hypothyroidism, unspecified: Secondary | ICD-10-CM | POA: Diagnosis not present

## 2018-12-07 DIAGNOSIS — Z0000 Encounter for general adult medical examination without abnormal findings: Secondary | ICD-10-CM

## 2018-12-07 DIAGNOSIS — R739 Hyperglycemia, unspecified: Secondary | ICD-10-CM

## 2018-12-07 DIAGNOSIS — I10 Essential (primary) hypertension: Secondary | ICD-10-CM

## 2018-12-07 DIAGNOSIS — Z1159 Encounter for screening for other viral diseases: Secondary | ICD-10-CM

## 2018-12-07 DIAGNOSIS — E042 Nontoxic multinodular goiter: Secondary | ICD-10-CM

## 2018-12-07 DIAGNOSIS — R931 Abnormal findings on diagnostic imaging of heart and coronary circulation: Secondary | ICD-10-CM | POA: Diagnosis not present

## 2018-12-07 LAB — HEPATIC FUNCTION PANEL
ALT: 20 U/L (ref 0–35)
AST: 23 U/L (ref 0–37)
Albumin: 4.3 g/dL (ref 3.5–5.2)
Alkaline Phosphatase: 77 U/L (ref 39–117)
Bilirubin, Direct: 0.1 mg/dL (ref 0.0–0.3)
Total Bilirubin: 0.5 mg/dL (ref 0.2–1.2)
Total Protein: 6.6 g/dL (ref 6.0–8.3)

## 2018-12-07 LAB — CBC WITH DIFFERENTIAL/PLATELET
Basophils Absolute: 0 10*3/uL (ref 0.0–0.1)
Basophils Relative: 0.6 % (ref 0.0–3.0)
Eosinophils Absolute: 0.1 10*3/uL (ref 0.0–0.7)
Eosinophils Relative: 2.9 % (ref 0.0–5.0)
HCT: 42.4 % (ref 36.0–46.0)
Hemoglobin: 13.8 g/dL (ref 12.0–15.0)
Lymphocytes Relative: 33.5 % (ref 12.0–46.0)
Lymphs Abs: 1.5 10*3/uL (ref 0.7–4.0)
MCHC: 32.6 g/dL (ref 30.0–36.0)
MCV: 89.1 fl (ref 78.0–100.0)
Monocytes Absolute: 0.4 10*3/uL (ref 0.1–1.0)
Monocytes Relative: 8.6 % (ref 3.0–12.0)
Neutro Abs: 2.4 10*3/uL (ref 1.4–7.7)
Neutrophils Relative %: 54.4 % (ref 43.0–77.0)
Platelets: 180 10*3/uL (ref 150.0–400.0)
RBC: 4.76 Mil/uL (ref 3.87–5.11)
RDW: 13.8 % (ref 11.5–15.5)
WBC: 4.5 10*3/uL (ref 4.0–10.5)

## 2018-12-07 LAB — BASIC METABOLIC PANEL
BUN: 11 mg/dL (ref 6–23)
CO2: 27 mEq/L (ref 19–32)
Calcium: 9.3 mg/dL (ref 8.4–10.5)
Chloride: 106 mEq/L (ref 96–112)
Creatinine, Ser: 0.74 mg/dL (ref 0.40–1.20)
GFR: 79.34 mL/min (ref >60.00)
Glucose, Bld: 77 mg/dL (ref 70–99)
Potassium: 4.4 mEq/L (ref 3.5–5.1)
Sodium: 141 mEq/L (ref 135–145)

## 2018-12-07 LAB — LIPID PANEL
Cholesterol: 145 mg/dL (ref 0–200)
HDL: 52.8 mg/dL (ref >39.00)
LDL Cholesterol: 74 mg/dL (ref 0–99)
NonHDL: 92.21
Total CHOL/HDL Ratio: 3
Triglycerides: 92 mg/dL (ref 0.0–149.0)
VLDL: 18.4 mg/dL (ref 0.0–40.0)

## 2018-12-07 LAB — TSH: TSH: 1.21 u[IU]/mL (ref 0.35–4.50)

## 2018-12-07 LAB — HEMOGLOBIN A1C: Hgb A1c MFr Bld: 5.8 % (ref 4.6–6.5)

## 2018-12-07 LAB — T4, FREE: Free T4: 0.83 ng/dL (ref 0.60–1.60)

## 2018-12-07 NOTE — Patient Instructions (Addendum)
Continue  Attention to lifestyle intervention healthy eating and exercise . Stay on BP med for now until getting back down on the   Eating better .    Tr acking   Plans for  When you are busy  .  Nothing out of bag 'plan which fast foods you can get in a pinch   Ex arbys french dip 510 calories   Chick filet grilled chicken 310     Preventive Care 40-64 Years, Female Preventive care refers to lifestyle choices and visits with your health care provider that can promote health and wellness. What does preventive care include?   A yearly physical exam. This is also called an annual well check.  Dental exams once or twice a year.  Routine eye exams. Ask your health care provider how often you should have your eyes checked.  Personal lifestyle choices, including: ? Daily care of your teeth and gums. ? Regular physical activity. ? Eating a healthy diet. ? Avoiding tobacco and drug use. ? Limiting alcohol use. ? Practicing safe sex. ? Taking low-dose aspirin daily starting at age 43. ? Taking vitamin and mineral supplements as recommended by your health care provider. What happens during an annual well check? The services and screenings done by your health care provider during your annual well check will depend on your age, overall health, lifestyle risk factors, and family history of disease. Counseling Your health care provider may ask you questions about your:  Alcohol use.  Tobacco use.  Drug use.  Emotional well-being.  Home and relationship well-being.  Sexual activity.  Eating habits.  Work and work Statistician.  Method of birth control.  Menstrual cycle.  Pregnancy history. Screening You may have the following tests or measurements:  Height, weight, and BMI.  Blood pressure.  Lipid and cholesterol levels. These may be checked every 5 years, or more frequently if you are over 48 years old.  Skin check.  Lung cancer screening. You may have this screening  every year starting at age 1 if you have a 30-pack-year history of smoking and currently smoke or have quit within the past 15 years.  Colorectal cancer screening. All adults should have this screening starting at age 1 and continuing until age 39. Your health care provider may recommend screening at age 20. You will have tests every 1-10 years, depending on your results and the type of screening test. People at increased risk should start screening at an earlier age. Screening tests may include: ? Guaiac-based fecal occult blood testing. ? Fecal immunochemical test (FIT). ? Stool DNA test. ? Virtual colonoscopy. ? Sigmoidoscopy. During this test, a flexible tube with a tiny camera (sigmoidoscope) is used to examine your rectum and lower colon. The sigmoidoscope is inserted through your anus into your rectum and lower colon. ? Colonoscopy. During this test, a long, thin, flexible tube with a tiny camera (colonoscope) is used to examine your entire colon and rectum.  Hepatitis C blood test.  Hepatitis B blood test.  Sexually transmitted disease (STD) testing.  Diabetes screening. This is done by checking your blood sugar (glucose) after you have not eaten for a while (fasting). You may have this done every 1-3 years.  Mammogram. This may be done every 1-2 years. Talk to your health care provider about when you should start having regular mammograms. This may depend on whether you have a family history of breast cancer.  BRCA-related cancer screening. This may be done if you have a family  history of breast, ovarian, tubal, or peritoneal cancers.  Pelvic exam and Pap test. This may be done every 3 years starting at age 16. Starting at age 54, this may be done every 5 years if you have a Pap test in combination with an HPV test.  Bone density scan. This is done to screen for osteoporosis. You may have this scan if you are at high risk for osteoporosis. Discuss your test results, treatment  options, and if necessary, the need for more tests with your health care provider. Vaccines Your health care provider may recommend certain vaccines, such as:  Influenza vaccine. This is recommended every year.  Tetanus, diphtheria, and acellular pertussis (Tdap, Td) vaccine. You may need a Td booster every 10 years.  Varicella vaccine. You may need this if you have not been vaccinated.  Zoster vaccine. You may need this after age 35.  Measles, mumps, and rubella (MMR) vaccine. You may need at least one dose of MMR if you were born in 1957 or later. You may also need a second dose.  Pneumococcal 13-valent conjugate (PCV13) vaccine. You may need this if you have certain conditions and were not previously vaccinated.  Pneumococcal polysaccharide (PPSV23) vaccine. You may need one or two doses if you smoke cigarettes or if you have certain conditions.  Meningococcal vaccine. You may need this if you have certain conditions.  Hepatitis A vaccine. You may need this if you have certain conditions or if you travel or work in places where you may be exposed to hepatitis A.  Hepatitis B vaccine. You may need this if you have certain conditions or if you travel or work in places where you may be exposed to hepatitis B.  Haemophilus influenzae type b (Hib) vaccine. You may need this if you have certain conditions. Talk to your health care provider about which screenings and vaccines you need and how often you need them. This information is not intended to replace advice given to you by your health care provider. Make sure you discuss any questions you have with your health care provider. Document Released: 10/19/2015 Document Revised: 11/12/2017 Document Reviewed: 07/24/2015 Elsevier Interactive Patient Education  2019 Alison Matthews

## 2018-12-08 ENCOUNTER — Other Ambulatory Visit: Payer: Self-pay | Admitting: Internal Medicine

## 2018-12-08 LAB — HIV ANTIBODY (ROUTINE TESTING W REFLEX): HIV 1&2 Ab, 4th Generation: NONREACTIVE

## 2018-12-08 LAB — HEPATITIS C ANTIBODY
Hepatitis C Ab: NONREACTIVE
SIGNAL TO CUT-OFF: 0.01 (ref <1.00)

## 2018-12-09 ENCOUNTER — Other Ambulatory Visit: Payer: Self-pay | Admitting: Internal Medicine

## 2018-12-30 ENCOUNTER — Other Ambulatory Visit: Payer: Self-pay | Admitting: Internal Medicine

## 2018-12-31 MED ORDER — ROSUVASTATIN CALCIUM 20 MG PO TABS: 20.0000 mg | ORAL_TABLET | Freq: Every day | ORAL | 0 refills | Status: AC

## 2018-12-31 NOTE — Telephone Encounter (Signed)
Please advise on refill request, on the rosuvastatin pt was only given #30 on 12/08/18

## 2019-02-18 ENCOUNTER — Other Ambulatory Visit: Payer: Self-pay | Admitting: Internal Medicine

## 2019-04-18 ENCOUNTER — Other Ambulatory Visit: Payer: Self-pay | Admitting: Internal Medicine

## 2019-04-18 NOTE — Telephone Encounter (Signed)
Patient is wanting prescription sent in for the remanding 9 months as she is having to call in every month to get them refilled. Patient is wanting this done so she can have her prescriptions delivered to her house. Please advise.

## 2019-07-19 ENCOUNTER — Other Ambulatory Visit: Payer: Self-pay | Admitting: Internal Medicine

## 2019-07-19 NOTE — Telephone Encounter (Signed)
Medication Refill - Medication: levothyroxine (SYNTHROID) 25 MCG tablet   Preferred Pharmacy (with phone number or street name):  [facility] [id number] Cyndi Bender, [place] - [address] AT [address]  [address] [place] [address]  Phone: [phone] Fax: [phone]     Agent: Please be advised that RX refills may take up to 3 business days. We ask that you follow-up with your pharmacy.    Patient called to follow up on Medication Rx. Patient has been without Medication for 3 days is away on vacation and would like to get this prescription filled asap . Please advise

## 2019-08-14 ENCOUNTER — Other Ambulatory Visit: Payer: Self-pay | Admitting: Internal Medicine

## 2019-08-18 ENCOUNTER — Telehealth: Payer: Self-pay | Admitting: Internal Medicine

## 2019-08-18 NOTE — Telephone Encounter (Signed)
Medication Refill - Medication: levothyroxine (SYNTHROID) 25 MCG tablet  Requesting 5 month prescription, pt is completely out. Please advise, pt is requesting 5 month, she says it is crazy to call every month.   Has the patient contacted their pharmacy? Yes.   (Agent: If no, request that the patient contact the pharmacy for the refill.) (Agent: If yes, when and what did the pharmacy advise?)  Preferred Pharmacy (with phone number or street name):  [facility], [place] - [address]  [place] [address]  Phone: [phone] Fax: [phone]     Agent: Please be advised that RX refills may take up to 3 business days. We ask that you follow-up with your pharmacy.

## 2019-08-19 ENCOUNTER — Other Ambulatory Visit: Payer: Self-pay | Admitting: Internal Medicine

## 2019-08-19 MED ORDER — LEVOTHYROXINE SODIUM 25 MCG PO TABS: 25.0000 ug | ORAL_TABLET | Freq: Every day | ORAL | 0 refills | Status: DC

## 2019-08-19 NOTE — Telephone Encounter (Signed)
Medication has been sent in 

## 2019-10-23 ENCOUNTER — Other Ambulatory Visit: Payer: Self-pay | Admitting: Internal Medicine

## 2019-12-20 ENCOUNTER — Other Ambulatory Visit: Payer: Self-pay | Admitting: Internal Medicine

## 2019-12-28 DIAGNOSIS — M5030 Other cervical disc degeneration, unspecified cervical region: Secondary | ICD-10-CM | POA: Diagnosis not present

## 2019-12-28 DIAGNOSIS — M47812 Spondylosis without myelopathy or radiculopathy, cervical region: Secondary | ICD-10-CM | POA: Diagnosis not present

## 2019-12-28 DIAGNOSIS — M542 Cervicalgia: Secondary | ICD-10-CM | POA: Diagnosis not present

## 2020-01-30 ENCOUNTER — Encounter (INDEPENDENT_AMBULATORY_CARE_PROVIDER_SITE_OTHER): Payer: Self-pay | Admitting: Ophthalmology

## 2020-02-01 DIAGNOSIS — E039 Hypothyroidism, unspecified: Secondary | ICD-10-CM | POA: Diagnosis not present

## 2020-02-01 DIAGNOSIS — E042 Nontoxic multinodular goiter: Secondary | ICD-10-CM | POA: Diagnosis not present

## 2020-02-23 DIAGNOSIS — H35373 Puckering of macula, bilateral: Secondary | ICD-10-CM | POA: Diagnosis not present

## 2020-02-23 DIAGNOSIS — H04123 Dry eye syndrome of bilateral lacrimal glands: Secondary | ICD-10-CM | POA: Diagnosis not present

## 2020-02-23 DIAGNOSIS — H43391 Other vitreous opacities, right eye: Secondary | ICD-10-CM | POA: Diagnosis not present

## 2020-02-23 DIAGNOSIS — H26492 Other secondary cataract, left eye: Secondary | ICD-10-CM | POA: Diagnosis not present

## 2020-02-29 ENCOUNTER — Encounter (INDEPENDENT_AMBULATORY_CARE_PROVIDER_SITE_OTHER): Payer: Self-pay | Admitting: Ophthalmology

## 2020-02-29 ENCOUNTER — Other Ambulatory Visit: Payer: Self-pay

## 2020-02-29 ENCOUNTER — Ambulatory Visit (INDEPENDENT_AMBULATORY_CARE_PROVIDER_SITE_OTHER): Payer: BC Managed Care – PPO | Admitting: Ophthalmology

## 2020-02-29 DIAGNOSIS — H35372 Puckering of macula, left eye: Secondary | ICD-10-CM

## 2020-02-29 DIAGNOSIS — H35371 Puckering of macula, right eye: Secondary | ICD-10-CM | POA: Diagnosis not present

## 2020-02-29 DIAGNOSIS — H35342 Macular cyst, hole, or pseudohole, left eye: Secondary | ICD-10-CM | POA: Diagnosis not present

## 2020-02-29 DIAGNOSIS — H43811 Vitreous degeneration, right eye: Secondary | ICD-10-CM | POA: Diagnosis not present

## 2020-02-29 NOTE — Assessment & Plan Note (Signed)

## 2020-02-29 NOTE — Assessment & Plan Note (Signed)
OD with minor prefoveal tissue, no foveal distortion therefore of no pathologic consequence.  Observe

## 2020-02-29 NOTE — Progress Notes (Signed)
02/29/2020     CHIEF COMPLAINT Patient presents for Retina Follow Up and PHQ-9 12 Week Follow-up   HISTORY OF PRESENT ILLNESS: Alison Matthews is a 64 y.o. female who presents to the clinic today for:   HPI    Retina Follow Up    Patient presents with  Other (ERM).  In both eyes.  Severity is moderate.  Duration of 1 year.  Since onset it is stable.  I, the attending physician,  performed the HPI with the patient and updated documentation appropriately.          Comments    1 Year f\u OU. OCT  Pt states she is having to use stronger reading glasses. Pt recently saw Dr. Patrice Paradise last week. Pt states Dr. Patrice Paradise saw a large floater in OD.       Last edited by Gerda Diss on 02/29/2020  8:23 AM. (History)      Referring physician: Burnis Medin, MD [place],  [place] [id number]  HISTORICAL INFORMATION:   Selected notes from the MEDICAL RECORD NUMBER    Lab Results  Component Value Date   HGBA1C 5.8 12/07/2018     CURRENT MEDICATIONS: No current outpatient medications on file. (Ophthalmic Drugs)   No current facility-administered medications for this visit. (Ophthalmic Drugs)   Current Outpatient Medications (Other)  Medication Sig  . aspirin 81 MG tablet Take 81 mg by mouth daily.  . fluticasone (FLONASE) 50 MCG/ACT nasal spray Place 2 sprays into both nostrils daily. (Patient not taking: Reported on 12/07/2018)  . labetalol (NORMODYNE) 100 MG tablet TAKE 1 TABLET BY MOUTH TWICE DAILY  . levothyroxine (SYNTHROID) 25 MCG tablet TAKE 1 TABLET(25 MCG) BY MOUTH DAILY  . Liraglutide -Weight Management (SAXENDA) 18 MG/3ML SOPN Saxenda 3 mg/0.5 mL (18 mg/3 mL) subcutaneous pen injector  . omeprazole (PRILOSEC) 20 MG capsule Take 20 mg by mouth daily. OTC  . rosuvastatin (CRESTOR) 20 MG tablet Take 1 tablet (20 mg total) by mouth daily.   No current facility-administered medications for this visit. (Other)      REVIEW OF  SYSTEMS:    ALLERGIES Allergies  Allergen Reactions  . Latex Itching    PAST MEDICAL HISTORY Past Medical History:  Diagnosis Date  . Breast pain    right  . Foot pain    bilateral  . H/O bone density study 2014   nl   . History of ETT    nl low exrcise capacity  . History of PFTs 2014   nl  . 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 Morton's neuroma   . PAF (paroxysmal atrial fibrillation) ([place]) 12/25/2011   During pregnancy 2012, s/p DCCV x 2   . Paroxysmal atrial fibrillation ([place])    During pregnancy 2012, s/p DCCV x 2  . Postmenopausal   . Shoulder pain, left   . Skin cancer of trunk 02/14/2012  . SVT (supraventricular tachycardia) ([place])   . Tingling    Past Surgical History:  Procedure Laterality Date  . CARDIAC CATHETERIZATION  12/24/11   cardiac ablation  . CATARACT EXTRACTION  2/10  . CESAREAN SECTION  10/10  . CESAREAN SECTION  07/14/2011   Procedure: CESAREAN SECTION;  Surgeon: Daria Pastures;  Location: [facility];  Service: Gynecology;  Laterality: N/A;  Repeat  . HYSTEROSCOPY WITH D & C N/A 01/17/2015   Procedure: DILATATION AND CURETTAGE /HYSTEROSCOPY/POLYPECTOMY;  Surgeon: Bobbye Charleston, MD;  Location: [facility];  Service: Gynecology;  Laterality: N/A;  . macular surgery    . PARS PLANA VITRECTOMY W/ REPAIR OF MACULAR HOLE  12/09  . POLYPECTOMY  9/09   Uterine  . SUPRAVENTRICULAR TACHYCARDIA ABLATION N/A 12/24/2011   Procedure: SUPRAVENTRICULAR TACHYCARDIA ABLATION;  Surgeon: Evans Lance, MD;  Location[facility];  Service: Cardiovascular;  Laterality: N/A;  . WISDOM TOOTH EXTRACTION  8/76    FAMILY HISTORY Family History  Problem Relation Age of Onset  . Hypertension Unknown   . Osteoporosis Unknown   . Arthritis Unknown   . Heart disease Father   . Breast cancer Maternal Aunt   . Diabetes Paternal Grandmother   . Colon cancer Neg Hx     SOCIAL HISTORY Social History   Tobacco Use  . Smoking status: Never Smoker  .  Smokeless tobacco: Never Used  Substance Use Topics  . Alcohol use: Yes    Comment:  2 DRINKS A MONTH  . Drug use: No         OPHTHALMIC EXAM:  Base Eye Exam    Visual Acuity (Snellen - Linear)      Right Left   Dist Shorter 20/20 -2 20/25 +       Tonometry (Tonopen, 8:17 AM)      Right Left   Pressure 18 15       Pupils      Pupils Dark Light Shape React APD   Right PERRL 3 2 Round Brisk None   Left PERRL 3 2 Round Brisk None       Visual Fields (Counting fingers)      Left Right    Full Full       Neuro/Psych    Oriented x3: Yes   Mood/Affect: Normal       Dilation    Both eyes: 1.0% Mydriacyl, 2.5% Phenylephrine @ 8:17 AM        Slit Lamp and Fundus Exam    External Exam      Right Left   External Normal Normal       Slit Lamp Exam      Right Left   Lids/Lashes Normal Normal   Conjunctiva/Sclera White and quiet White and quiet   Cornea Clear Clear   Anterior Chamber Deep and quiet Deep and quiet   Iris Round and reactive Round and reactive   Lens 2+ Nuclear sclerosis  Centered posterior chamber intraocular lens   Anterior Vitreous Normal Normal       Fundus Exam      Right Left   Posterior Vitreous Posterior vitreous detachment VITRECTOMIZED   Disc Normal Normal   C/D Ratio 0.25 0.4   Macula Normal Normal   Vessels Normal Normal   Periphery Normal, no holes or tears No holes or tears          IMAGING AND PROCEDURES  Imaging and Procedures for 02/29/20  OCT, Retina - OU - Both Eyes       Right Eye Quality was good. Scan locations included subfoveal. Central Foveal Thickness: 280. Findings include normal observations.   Left Eye Quality was good. Scan locations included subfoveal. Central Foveal Thickness: 318.   Notes OD, small preretinal at the retinal tissue, not touching the retina thus likely a dehiscence of a small internal limiting membrane.  Small perifoveal floater is noted centrally.  There is no macular hole  OS, status  post macular hole repair some 12 years ago.  Stable macular findings.  ASSESSMENT/PLAN:  Right epiretinal membrane OD with minor prefoveal tissue, no foveal distortion therefore of no pathologic consequence.  Observe  Posterior vitreous detachment of right eye   The nature of posterior vitreous detachment was discussed with the patient as well as its physiology, its age prevalence, and its possible implication regarding retinal breaks and detachment.  An informational brochure was given to the patient.  All the patient's questions were answered.  The patient was asked to return if new or different flashes or floaters develops.   Patient was instructed to contact office immediately if any changes were noticed. I explained to the patient that vitreous inside the eye is similar to jello inside a bowl. As the jello melts it can start to pull away from the bowl, similarly the vitreous throughout our lives can begin to pull away from the retina. That process is called a posterior vitreous detachment. In some cases, the vitreous can tug hard enough on the retina to form a retinal tear. I discussed with the patient the signs and symptoms of a retinal detachment.  Do not rub the eye.      ICD-10-CM   1. Right epiretinal membrane  H35.371 OCT, Retina - OU - Both Eyes  2. Posterior vitreous detachment of right eye  H43.811   3. Macular pseudohole of left eye  H35.342   4. Left epiretinal membrane  H35.372     1.  New PVD OD, no retinal holes or tears.  No macular topographic distortion OD.  Observe  2.  3.  Ophthalmic Meds Ordered this visit:  No orders of the defined types were placed in this encounter.      Return in about 1 year (around 02/28/2021) for DILATE OU, OCT.  There are no Patient Instructions on file for this visit.   Explained the diagnoses, plan, and follow up with the patient and they expressed understanding.  Patient expressed understanding of the  importance of proper follow up care.   Clent Demark Pecola Haxton M.D. Diseases & Surgery of the Retina and Vitreous [facility]ina [facility] Diabetic [place] 02/29/20     Abbreviations: M myopia (nearsighted); A astigmatism; H hyperopia (farsighted); P presbyopia; Mrx spectacle prescription;  CTL contact lenses; OD right eye; OS left eye; OU both eyes  XT exotropia; ET esotropia; PEK punctate epithelial keratitis; PEE punctate epithelial erosions; DES dry eye syndrome; MGD meibomian gland dysfunction; ATs artificial tears; PFAT's preservative free artificial tears; [place] nuclear sclerotic cataract; PSC posterior subcapsular cataract; ERM epi-retinal membrane; PVD posterior vitreous detachment; RD retinal detachment; DM diabetes mellitus; DR diabetic retinopathy; NPDR non-proliferative diabetic retinopathy; PDR proliferative diabetic retinopathy; CSME clinically significant macular edema; DME diabetic macular edema; dbh dot blot hemorrhages; CWS cotton wool spot; POAG primary open angle glaucoma; C/D cup-to-disc ratio; HVF humphrey visual field; GVF goldmann visual field; OCT optical coherence tomography; IOP intraocular pressure; BRVO Branch retinal vein occlusion; CRVO central retinal vein occlusion; CRAO central retinal artery occlusion; BRAO branch retinal artery occlusion; RT retinal tear; SB scleral buckle; PPV pars plana vitrectomy; VH Vitreous hemorrhage; PRP panretinal laser photocoagulation; IVK intravitreal kenalog; VMT vitreomacular traction; MH Macular hole;  NVD neovascularization of the disc; NVE neovascularization elsewhere; AREDS age related eye disease study; ARMD age related macular degeneration; POAG primary open angle glaucoma; EBMD epithelial/anterior basement membrane dystrophy; ACIOL anterior chamber intraocular lens; IOL intraocular lens; PCIOL posterior chamber intraocular lens; Phaco/IOL phacoemulsification with intraocular lens placement; PRK photorefractive keratectomy; LASIK laser assisted in situ  keratomileusis; HTN hypertension; DM diabetes  mellitus; COPD chronic obstructive pulmonary disease

## 2020-05-05 ENCOUNTER — Other Ambulatory Visit: Payer: Self-pay | Admitting: Internal Medicine

## 2020-05-07 ENCOUNTER — Other Ambulatory Visit: Payer: Self-pay | Admitting: Internal Medicine

## 2020-06-05 DIAGNOSIS — L738 Other specified follicular disorders: Secondary | ICD-10-CM | POA: Diagnosis not present

## 2020-06-05 DIAGNOSIS — Z85828 Personal history of other malignant neoplasm of skin: Secondary | ICD-10-CM | POA: Diagnosis not present

## 2020-06-05 DIAGNOSIS — D225 Melanocytic nevi of trunk: Secondary | ICD-10-CM | POA: Diagnosis not present

## 2020-06-05 DIAGNOSIS — D2239 Melanocytic nevi of other parts of face: Secondary | ICD-10-CM | POA: Diagnosis not present

## 2020-06-13 ENCOUNTER — Ambulatory Visit: Payer: BC Managed Care – PPO | Admitting: Internal Medicine

## 2020-06-13 DIAGNOSIS — Z7189 Other specified counseling: Secondary | ICD-10-CM | POA: Diagnosis not present

## 2020-06-13 DIAGNOSIS — K219 Gastro-esophageal reflux disease without esophagitis: Secondary | ICD-10-CM | POA: Diagnosis not present

## 2020-06-13 DIAGNOSIS — R931 Abnormal findings on diagnostic imaging of heart and coronary circulation: Secondary | ICD-10-CM | POA: Diagnosis not present

## 2020-06-13 NOTE — Progress Notes (Signed)
Chief Complaint  Patient presents with  . Annual Exam    Doing well  . Follow-up    HPI: Patient  Alison Matthews  64 y.o. comes in today for Preventive Health Care visit  And evaluation.  Did  j and j  Vaccine  And  Antibody  Testing negative . Advised to get a" moderna"booster"  Off and on saxenda  Non for 6 months   Will be working on weight and self care  Bp has been good Off thyroid med after check by endo  For at least 4 months  Please send results to Illene Regulus  Fax [phone] No af episodes  Health Maintenance  Topic Date Due  . MAMMOGRAM  12/09/2018  . INFLUENZA VACCINE  05/06/2020  . PAP SMEAR-Modifier  06/18/2020  . TETANUS/TDAP  07/14/2021  . COLONOSCOPY  08/10/2023  . COVID-19 Vaccine  Completed  . Hepatitis C Screening  Completed  . 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 GI /GU: No adominal pain, vomiting, change in bowel habits. No blood in the stool. No significant GU symptoms. SKIN/HEME: ,no acute skin rashes suspicious lesions or bleeding. No lymphadenopathy, nodules, masses.  NEURO/ PSYCH:  No neurologic signs such as weakness numbness. No depression anxiety. IMM/ Allergy: No unusual infections.  Allergy .   REST of 12 system review negative except as per HPI   Past Medical History:  Diagnosis Date  . Breast pain    right  . Foot pain    bilateral  . H/O bone density study 2014   nl   . History of ETT    nl low exrcise capacity  . History of PFTs 2014   nl  . 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 Morton's neuroma   . PAF (paroxysmal atrial fibrillation) ([place]) 12/25/2011   During pregnancy 2012, s/p DCCV x 2   . Paroxysmal atrial fibrillation ([place])    During pregnancy 2012, s/p DCCV x 2  . Postmenopausal   . Shoulder pain, left   . Skin cancer of trunk 02/14/2012  . SVT (supraventricular tachycardia) ([place])   . Tingling     Past Surgical History:  Procedure Laterality Date  . CARDIAC CATHETERIZATION  12/24/11   cardiac ablation  . CATARACT EXTRACTION  2/10  . CESAREAN SECTION  10/10  . CESAREAN SECTION  07/14/2011   Procedure: CESAREAN SECTION;  Surgeon: Daria Pastures;  Location: [facility];  Service: Gynecology;  Laterality: N/A;  Repeat  . HYSTEROSCOPY WITH D & C N/A 01/17/2015   Procedure: DILATATION AND CURETTAGE /HYSTEROSCOPY/POLYPECTOMY;  Surgeon: Bobbye Charleston, MD;  Location: [facility];  Service: Gynecology;  Laterality: N/A;  . macular surgery    . PARS PLANA VITRECTOMY W/ REPAIR OF MACULAR HOLE  12/09  . POLYPECTOMY  9/09   Uterine  . SUPRAVENTRICULAR TACHYCARDIA ABLATION N/A 12/24/2011   Procedure: SUPRAVENTRICULAR TACHYCARDIA ABLATION;  Surgeon: Evans Lance, MD;  Location: [facility] CATH LAB;  Service: Cardiovascular;  Laterality: N/A;  . WISDOM TOOTH EXTRACTION  8/76    Family History  Problem Relation Age of Onset  . Hypertension Other   . Osteoporosis Other   . Arthritis Other   . Heart disease Father   . Breast cancer Maternal Aunt   . Diabetes Paternal Grandmother   . Colon cancer Neg Hx     Social History   Socioeconomic History  . Marital status: Married    Spouse name: Not on file  . Number of children: Not on file  . Years of education: Not on file  . Highest education level: Not on file  Occupational History  . Not on file  Tobacco Use  . Smoking status: Never Smoker  . Smokeless tobacco: Never Used  Vaping Use  . Vaping Use: Never used  Substance and Sexual Activity  . Alcohol use: Yes    Comment:  2 DRINKS A MONTH  . Drug  use: No  . Sexual activity: Yes  Other Topics Concern  . 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 Difficulty of Paying Living Expenses: Not on file  Food Insecurity:   . Worried About Charity fundraiser in the Last Year: Not on file  . Ran Out of Food in the Last Year: Not on file  Transportation Needs:   . Lack of Transportation (Medical): Not on file  . Lack of Transportation (Non-Medical): Not on file  Physical Activity:   . Days of Exercise per Week: Not on file  . Minutes of Exercise per Session: Not on file  Stress:   . Feeling of Stress : Not on file  Social Connections:   . Frequency of Communication with Friends and Family: Not on file  . Frequency of Social Gatherings with Friends and Family: Not on file  . Attends Religious Services: Not on file  . Active Member of Clubs or Organizations: Not on file  . Attends Archivist Meetings: Not on file  . Marital Status: Not on file    Outpatient Medications Prior to Visit  Medication Sig Dispense Refill  . ascorbic acid (VITAMIN C) 500 MG tablet Take by mouth.    Marland Kitchen aspirin 81 MG tablet Take 81 mg by mouth daily.    . Cholecalciferol 25 MCG (1000 UT) tablet Take by mouth.    . cyanocobalamin 1000 MCG tablet Take by mouth.    . labetalol (NORMODYNE) 100 MG tablet Take 1 tablet (100 mg total) by mouth 2 (two) times daily. Please schedule visit with labs for further refills. [phone] (Patient taking differently: Take 50 mg by mouth 2 (two) times daily. Please schedule visit with labs for further refills. [phone]) 60 tablet 0  . Liraglutide -Weight Management (SAXENDA) 18 MG/3ML SOPN Saxenda 3  mg/0.5 mL (18 mg/3 mL) subcutaneous pen injector    . methocarbamol (ROBAXIN) 500 MG tablet methocarbamol 500 mg tablet  TAKE 2 TABLETS BY MOUTH FOUR TIMES DAILY    . Multiple Vitamins-Minerals (COMPLETE) TABS Take 1 tablet by mouth daily.    Marland Kitchen  omeprazole (PRILOSEC) 20 MG capsule Take 20 mg by mouth daily. OTC    . rosuvastatin (CRESTOR) 20 MG tablet Take 1 tablet (20 mg total) by mouth daily. 90 tablet 0  . fluticasone (FLONASE) 50 MCG/ACT nasal spray Place 2 sprays into both nostrils daily. (Patient not taking: Reported on 12/07/2018) 16 g 6  . labetalol (NORMODYNE) 100 MG tablet Take by mouth. (Patient not taking: Reported on 06/15/2020)    . levothyroxine (SYNTHROID) 25 MCG tablet TAKE 1 TABLET(25 MCG) BY MOUTH DAILY (Patient not taking: Reported on 06/15/2020) 90 tablet 0   No facility-administered medications prior to visit.     EXAM:  BP 122/82   Pulse 73   Temp 98.2 F (36.8 C) (Oral)   Ht 5' 5.75" (1.67 m)   Wt 237 lb 3.2 oz (107.6 kg)   LMP 09/05/2010 (Approximate)   SpO2 97%   BMI 38.58 kg/m   Body mass index is 38.58 kg/m. Wt Readings from Last 3 Encounters:  06/15/20 237 lb 3.2 oz (107.6 kg)  12/07/18 229 lb 3.2 oz (104 kg)  09/16/17 233 lb 14.4 oz (106.1 kg)    Physical Exam: Vital signs reviewed CLE:XNTZ is a well-developed well-nourished alert cooperative    who appearsr stated age in no acute distress.  HEENT: normocephalic atraumatic , Eyes: PERRL EOM's full, conjunctiva clear, Nares: paten,t no deformity discharge or tenderness., Ears: no deformity EAC's clear TMs with normal landmarks. Mouth: masked . NECK: supple without masses, thyromegaly or bruits. CHEST/PULM:  Clear to auscultation and percussion breath sounds equal no wheeze , rales or rhonchi. No chest wall deformities or tenderness. Breast: normal by inspection . No dimpling, discharge, masses, tenderness or discharge . CV: PMI is nondisplaced, S1 S2 no gallops, murmurs, rubs. Peripheral pulses are  full without delay.No JVD .  ABDOMEN: Bowel sounds normal nontender  No guard or rebound, no hepato splenomegal no CVA tenderness.   Extremtities:  No clubbing cyanosis or edema, no acute joint swelling or redness no focal atrophy NEURO:  Oriented x3, cranial nerves 3-12 appear to be intact, no obvious focal weakness,gait within normal limits no abnormal reflexes or asymmetrical SKIN: No acute rashes normal turgor, color, no bruising or petechiae. PSYCH: Oriented, good eye contact, no obvious depression anxiety, cognition and judgment appear normal. LN: no cervical axillary inguinal adenopathy  Lab Results  Component Value Date   WBC 4.5 12/07/2018   HGB 13.8 12/07/2018   HCT 42.4 12/07/2018   PLT 180.0 12/07/2018   GLUCOSE 77 12/07/2018   CHOL 145 12/07/2018   TRIG 92.0 12/07/2018   HDL 52.80 12/07/2018   LDLDIRECT 149.6 01/11/2010   LDLCALC 74 12/07/2018   ALT 20 12/07/2018   AST 23 12/07/2018   NA 141 12/07/2018   K 4.4 12/07/2018   CL 106 12/07/2018   CREATININE 0.74 12/07/2018   BUN 11 12/07/2018   CO2 27 12/07/2018   TSH 1.21 12/07/2018   INR 0.9 04/08/2008   HGBA1C 5.8 12/07/2018    BP Readings from Last 3 Encounters:  06/15/20 122/82  12/07/18 122/66  09/16/17 110/70    Lab plan  reviewed with patient  Fasted   ASSESSMENT AND PLAN:  Discussed the following assessment and plan:    ICD-10-CM   1. Visit for preventive health examination  Z00.00 CBC with Differential/Platelet    Hepatic function panel    Hemoglobin A1c    Lipid panel    TSH    T4, free    BASIC METABOLIC  PANEL WITH GFR    VITAMIN D 25 Hydroxy (Vit-D Deficiency, Fractures)    Vitamin B12    Vitamin B12    VITAMIN D 25 Hydroxy (Vit-D Deficiency, Fractures)    BASIC METABOLIC PANEL WITH GFR    T4, free    TSH    Lipid panel    Hemoglobin A1c    Hepatic function panel    CBC with Differential/Platelet  2. Medication management  Z79.899 CBC with Differential/Platelet    Hepatic function  panel    Hemoglobin A1c    Lipid panel    TSH    T4, free    BASIC METABOLIC PANEL WITH GFR    VITAMIN D 25 Hydroxy (Vit-D Deficiency, Fractures)    Vitamin B12    Vitamin B12    VITAMIN D 25 Hydroxy (Vit-D Deficiency, Fractures)    BASIC METABOLIC PANEL WITH GFR    T4, free    TSH    Lipid panel    Hemoglobin A1c    Hepatic function panel    CBC with Differential/Platelet  3. Essential hypertension  I10 CBC with Differential/Platelet    Hepatic function panel    Hemoglobin A1c    Lipid panel    TSH    T4, free    BASIC METABOLIC PANEL WITH GFR    VITAMIN D 25 Hydroxy (Vit-D Deficiency, Fractures)    Vitamin B12    Vitamin B12    VITAMIN D 25 Hydroxy (Vit-D Deficiency, Fractures)    BASIC METABOLIC PANEL WITH GFR    T4, free    TSH    Lipid panel    Hemoglobin A1c    Hepatic function panel    CBC with Differential/Platelet  4. Hypothyroidism, unspecified type  E03.9 CBC with Differential/Platelet    Hepatic function panel    Hemoglobin A1c    Lipid panel    TSH    T4, free    BASIC METABOLIC PANEL WITH GFR    VITAMIN D 25 Hydroxy (Vit-D Deficiency, Fractures)    Vitamin B12    Vitamin B12    VITAMIN D 25 Hydroxy (Vit-D Deficiency, Fractures)    BASIC METABOLIC PANEL WITH GFR    T4, free    TSH    Lipid panel    Hemoglobin A1c    Hepatic function panel    CBC with Differential/Platelet  5. Hyperglycemia  R73.9 CBC with Differential/Platelet    Hepatic function panel    Hemoglobin A1c    Lipid panel    TSH    T4, free    BASIC METABOLIC PANEL WITH GFR    VITAMIN D 25 Hydroxy (Vit-D Deficiency, Fractures)    Vitamin B12    Vitamin B12    VITAMIN D 25 Hydroxy (Vit-D Deficiency, Fractures)    BASIC METABOLIC PANEL WITH GFR    T4, free    TSH    Lipid panel    Hemoglobin A1c    Hepatic function panel    CBC with Differential/Platelet  6. Agatston coronary artery calcium score between 100 and 199  R93.1   7. Multiple thyroid nodules  E04.2   8.  Obesity (BMI 30-39.9)  E66.9   labs   Thyroid off meds  Get mammo  Disc   Immunization    Has a plan  moderna flu  shingrix etc  Return in about 1 year (around 06/15/2021) for preventive /cpx and medications.  Patient Care Team: Burnis Medin, MD as PCP - General  Evans Lance, MD (Cardiology) Bobbye Charleston, MD as Attending Physician (Obstetrics and Gynecology) Griselda Miner, MD as Attending Physician (Dermatology) Viona Gilmore. Erick Alley M D  Patient Instructions  Will notify you  of labs when available.  Get  Your mammo colon screen.  BP is good today .  Weight management as possible .   Health Maintenance, Female Adopting a healthy lifestyle and getting preventive care are important in promoting health and wellness. Ask your health care provider about:  The right schedule for you to have regular tests and exams.  Things you can do on your own to prevent diseases and keep yourself healthy. What should I know about diet, weight, and exercise? Eat a healthy diet   Eat a diet that includes plenty of vegetables, fruits, low-fat dairy products, and lean protein.  Do not eat a lot of foods that are high in solid fats, added sugars, or sodium. Maintain a healthy weight Body mass index (BMI) is used to identify weight problems. It estimates body fat based on height and weight. Your health care provider can help determine your BMI and help you achieve or maintain a healthy weight. Get regular exercise Get regular exercise. This is one of the most important things you can do for your health. Most adults should:  Exercise for at least 150 minutes each week. The exercise should increase your heart rate and make you sweat (moderate-intensity exercise).  Do strengthening exercises at least twice a week. This is in addition to the moderate-intensity exercise.  Spend less time sitting. Even light physical activity can be beneficial. Watch cholesterol and blood lipids Have your blood  tested for lipids and cholesterol at 64 years of age, then have this test every 5 years. Have your cholesterol levels checked more often if:  Your lipid or cholesterol levels are high.  You are older than 64 years of age.  You are at high risk for heart disease. What should I know about cancer screening? Depending on your health history and family history, you may need to have cancer screening at various ages. This may include screening for:  Breast cancer.  Cervical cancer.  Colorectal cancer.  Skin cancer.  Lung cancer. What should I know about heart disease, diabetes, and high blood pressure? Blood pressure and heart disease  High blood pressure causes heart disease and increases the risk of stroke. This is more likely to develop in people who have high blood pressure readings, are of African descent, or are overweight.  Have your blood pressure checked: ? Every 3-5 years if you are 17-64 years of age. ? Every year if you are 93 years old or older. Diabetes Have regular diabetes screenings. This checks your fasting blood sugar level. Have the screening done:  Once every three years after age 74 if you are at a normal weight and have a low risk for diabetes.  More often and at a younger age if you are overweight or have a high risk for diabetes. What should I know about preventing infection? Hepatitis B If you have a higher risk for hepatitis B, you should be screened for this virus. Talk with your health care provider to find out if you are at risk for hepatitis B infection. Hepatitis C Testing is recommended for:  Everyone born from [birth year] through 1965.  Anyone with known risk factors for hepatitis C. Sexually transmitted infections (STIs)  Get screened for STIs, including gonorrhea and chlamydia, if: ? You are sexually active and are  younger than 64 years of age. ? You are older than 64 years of age and your health care provider tells you that you are at risk for  this type of infection. ? Your sexual activity has changed since you were last screened, and you are at increased risk for chlamydia or gonorrhea. Ask your health care provider if you are at risk.  Ask your health care provider about whether you are at high risk for HIV. Your health care provider may recommend a prescription medicine to help prevent HIV infection. If you choose to take medicine to prevent HIV, you should first get tested for HIV. You should then be tested every 3 months for as long as you are taking the medicine. Pregnancy  If you are about to stop having your period (premenopausal) and you may become pregnant, seek counseling before you get pregnant.  Take 400 to 800 micrograms (mcg) of folic acid every day if you become pregnant.  Ask for birth control (contraception) if you want to prevent pregnancy. Osteoporosis and menopause Osteoporosis is a disease in which the bones lose minerals and strength with aging. This can result in bone fractures. If you are 23 years old or older, or if you are at risk for osteoporosis and fractures, ask your health care provider if you should:  Be screened for bone loss.  Take a calcium or vitamin D supplement to lower your risk of fractures.  Be given hormone replacement therapy (HRT) to treat symptoms of menopause. Follow these instructions at home: Lifestyle  Do not use any products that contain nicotine or tobacco, such as cigarettes, e-cigarettes, and chewing tobacco. If you need help quitting, ask your health care provider.  Do not use street drugs.  Do not share needles.  Ask your health care provider for help if you need support or information about quitting drugs. Alcohol use  Do not drink alcohol if: ? Your health care provider tells you not to drink. ? You are pregnant, may be pregnant, or are planning to become pregnant.  If you drink alcohol: ? Limit how much you use to 0-1 drink a day. ? Limit intake if you are  breastfeeding.  Be aware of how much alcohol is in your drink. In the [place] one drink equals one 12 oz bottle of beer (355 mL), one 5 oz glass of wine (148 mL), or one 1 oz glass of hard liquor (44 mL). General instructions  Schedule regular health, dental, and eye exams.  Stay current with your vaccines.  Tell your health care provider if: ? You often feel depressed. ? You have ever been abused or do not feel safe at home. Summary  Adopting a healthy lifestyle and getting preventive care are important in promoting health and wellness.  Follow your health care provider's instructions about healthy diet, exercising, and getting tested or screened for diseases.  Follow your health care provider's instructions on monitoring your cholesterol and blood pressure. This information is not intended to replace advice given to you by your health care provider. Make sure you discuss any questions you have with your health care provider. Document Revised: 09/15/2018 Document Reviewed: 09/15/2018 Elsevier Patient Education  2020 [employer] Rhandi Despain M.D.

## 2020-06-15 ENCOUNTER — Other Ambulatory Visit: Payer: Self-pay

## 2020-06-15 ENCOUNTER — Ambulatory Visit (INDEPENDENT_AMBULATORY_CARE_PROVIDER_SITE_OTHER): Payer: BC Managed Care – PPO | Admitting: Internal Medicine

## 2020-06-15 ENCOUNTER — Encounter: Payer: Self-pay | Admitting: Internal Medicine

## 2020-06-15 VITALS — BP 122/82 | HR 73 | Temp 98.2°F | Ht 65.75 in | Wt 237.2 lb

## 2020-06-15 DIAGNOSIS — R739 Hyperglycemia, unspecified: Secondary | ICD-10-CM

## 2020-06-15 DIAGNOSIS — Z0000 Encounter for general adult medical examination without abnormal findings: Secondary | ICD-10-CM | POA: Diagnosis not present

## 2020-06-15 DIAGNOSIS — E669 Obesity, unspecified: Secondary | ICD-10-CM

## 2020-06-15 DIAGNOSIS — Z79899 Other long term (current) drug therapy: Secondary | ICD-10-CM

## 2020-06-15 DIAGNOSIS — E039 Hypothyroidism, unspecified: Secondary | ICD-10-CM | POA: Diagnosis not present

## 2020-06-15 DIAGNOSIS — R931 Abnormal findings on diagnostic imaging of heart and coronary circulation: Secondary | ICD-10-CM

## 2020-06-15 DIAGNOSIS — I10 Essential (primary) hypertension: Secondary | ICD-10-CM

## 2020-06-15 DIAGNOSIS — E042 Nontoxic multinodular goiter: Secondary | ICD-10-CM

## 2020-06-15 NOTE — Patient Instructions (Signed)
Will notify you  of labs when available.  Get  Your mammo colon screen.  BP is good today .  Weight management as possible .   Health Maintenance, Female Adopting a healthy lifestyle and getting preventive care are important in promoting health and wellness. Ask your health care provider about:  The right schedule for you to have regular tests and exams.  Things you can do on your own to prevent diseases and keep yourself healthy. What should I know about diet, weight, and exercise? Eat a healthy diet   Eat a diet that includes plenty of vegetables, fruits, low-fat dairy products, and lean protein.  Do not eat a lot of foods that are high in solid fats, added sugars, or sodium. Maintain a healthy weight Body mass index (BMI) is used to identify weight problems. It estimates body fat based on height and weight. Your health care provider can help determine your BMI and help you achieve or maintain a healthy weight. Get regular exercise Get regular exercise. This is one of the most important things you can do for your health. Most adults should:  Exercise for at least 150 minutes each week. The exercise should increase your heart rate and make you sweat (moderate-intensity exercise).  Do strengthening exercises at least twice a week. This is in addition to the moderate-intensity exercise.  Spend less time sitting. Even light physical activity can be beneficial. Watch cholesterol and blood lipids Have your blood tested for lipids and cholesterol at 64 years of age, then have this test every 5 years. Have your cholesterol levels checked more often if:  Your lipid or cholesterol levels are high.  You are older than 64 years of age.  You are at high risk for heart disease. What should I know about cancer screening? Depending on your health history and family history, you may need to have cancer screening at various ages. This may include screening for:  Breast cancer.  Cervical  cancer.  Colorectal cancer.  Skin cancer.  Lung cancer. What should I know about heart disease, diabetes, and high blood pressure? Blood pressure and heart disease  High blood pressure causes heart disease and increases the risk of stroke. This is more likely to develop in people who have high blood pressure readings, are of African descent, or are overweight.  Have your blood pressure checked: ? Every 3-5 years if you are 43-24 years of age. ? Every year if you are 26 years old or older. Diabetes Have regular diabetes screenings. This checks your fasting blood sugar level. Have the screening done:  Once every three years after age 69 if you are at a normal weight and have a low risk for diabetes.  More often and at a younger age if you are overweight or have a high risk for diabetes. What should I know about preventing infection? Hepatitis B If you have a higher risk for hepatitis B, you should be screened for this virus. Talk with your health care provider to find out if you are at risk for hepatitis B infection. Hepatitis C Testing is recommended for:  Everyone born from [birth year] through 1965.  Anyone with known risk factors for hepatitis C. Sexually transmitted infections (STIs)  Get screened for STIs, including gonorrhea and chlamydia, if: ? You are sexually active and are younger than 64 years of age. ? You are older than 64 years of age and your health care provider tells you that you are at risk for this type  of infection. ? Your sexual activity has changed since you were last screened, and you are at increased risk for chlamydia or gonorrhea. Ask your health care provider if you are at risk.  Ask your health care provider about whether you are at high risk for HIV. Your health care provider may recommend a prescription medicine to help prevent HIV infection. If you choose to take medicine to prevent HIV, you should first get tested for HIV. You should then be tested every 3  months for as long as you are taking the medicine. Pregnancy  If you are about to stop having your period (premenopausal) and you may become pregnant, seek counseling before you get pregnant.  Take 400 to 800 micrograms (mcg) of folic acid every day if you become pregnant.  Ask for birth control (contraception) if you want to prevent pregnancy. Osteoporosis and menopause Osteoporosis is a disease in which the bones lose minerals and strength with aging. This can result in bone fractures. If you are 32 years old or older, or if you are at risk for osteoporosis and fractures, ask your health care provider if you should:  Be screened for bone loss.  Take a calcium or vitamin D supplement to lower your risk of fractures.  Be given hormone replacement therapy (HRT) to treat symptoms of menopause. Follow these instructions at home: Lifestyle  Do not use any products that contain nicotine or tobacco, such as cigarettes, e-cigarettes, and chewing tobacco. If you need help quitting, ask your health care provider.  Do not use street drugs.  Do not share needles.  Ask your health care provider for help if you need support or information about quitting drugs. Alcohol use  Do not drink alcohol if: ? Your health care provider tells you not to drink. ? You are pregnant, may be pregnant, or are planning to become pregnant.  If you drink alcohol: ? Limit how much you use to 0-1 drink a day. ? Limit intake if you are breastfeeding.  Be aware of how much alcohol is in your drink. In the [place] one drink equals one 12 oz bottle of beer (355 mL), one 5 oz glass of wine (148 mL), or one 1 oz glass of hard liquor (44 mL). General instructions  Schedule regular health, dental, and eye exams.  Stay current with your vaccines.  Tell your health care provider if: ? You often feel depressed. ? You have ever been abused or do not feel safe at home. Summary  Adopting a healthy lifestyle and getting  preventive care are important in promoting health and wellness.  Follow your health care provider's instructions about healthy diet, exercising, and getting tested or screened for diseases.  Follow your health care provider's instructions on monitoring your cholesterol and blood pressure. This information is not intended to replace advice given to you by your health care provider. Make sure you discuss any questions you have with your health care provider. Document Revised: 09/15/2018 Document Reviewed: 09/15/2018 Elsevier Patient Education  2020 [employer].

## 2020-06-16 LAB — HEPATIC FUNCTION PANEL
AG Ratio: 1.8 (calc) (ref 1.0–2.5)
ALT: 14 U/L (ref 6–29)
AST: 17 U/L (ref 10–35)
Albumin: 4.4 g/dL (ref 3.6–5.1)
Alkaline phosphatase (APISO): 79 U/L (ref 37–153)
Bilirubin, Direct: 0.1 mg/dL (ref 0.0–0.2)
Globulin: 2.4 g/dL (calc) (ref 1.9–3.7)
Indirect Bilirubin: 0.5 mg/dL (calc) (ref 0.2–1.2)
Total Bilirubin: 0.6 mg/dL (ref 0.2–1.2)
Total Protein: 6.8 g/dL (ref 6.1–8.1)

## 2020-06-16 LAB — BASIC METABOLIC PANEL WITH GFR
BUN: 14 mg/dL (ref 7–25)
CO2: 25 mmol/L (ref 20–32)
Calcium: 9.3 mg/dL (ref 8.6–10.4)
Chloride: 106 mmol/L (ref 98–110)
Creat: 0.83 mg/dL (ref 0.50–0.99)
GFR, Est African American: 86 mL/min/{1.73_m2} (ref >=60)
GFR, Est Non African American: 75 mL/min/{1.73_m2} (ref >=60)
Glucose, Bld: 95 mg/dL (ref 65–99)
Potassium: 4.1 mmol/L (ref 3.5–5.3)
Sodium: 142 mmol/L (ref 135–146)

## 2020-06-16 LAB — VITAMIN D 25 HYDROXY (VIT D DEFICIENCY, FRACTURES): Vit D, 25-Hydroxy: 29 ng/mL — ABNORMAL LOW (ref 30–100)

## 2020-06-16 LAB — LIPID PANEL
Cholesterol: 166 mg/dL (ref <200)
HDL: 53 mg/dL (ref >=50)
LDL Cholesterol (Calc): 88 mg/dL (calc)
Non-HDL Cholesterol (Calc): 113 mg/dL (calc) (ref <130)
Total CHOL/HDL Ratio: 3.1 (calc) (ref <5.0)
Triglycerides: 152 mg/dL — ABNORMAL HIGH (ref <150)

## 2020-06-16 LAB — CBC WITH DIFFERENTIAL/PLATELET
Absolute Monocytes: 546 cells/uL (ref 200–950)
Basophils Absolute: 32 cells/uL (ref 0–200)
Basophils Relative: 0.6 %
Eosinophils Absolute: 159 cells/uL (ref 15–500)
Eosinophils Relative: 3 %
HCT: 45.2 % — ABNORMAL HIGH (ref 35.0–45.0)
Hemoglobin: 14.5 g/dL (ref 11.7–15.5)
Lymphs Abs: 1405 cells/uL (ref 850–3900)
MCH: 29.2 pg (ref 27.0–33.0)
MCHC: 32.1 g/dL (ref 32.0–36.0)
MCV: 91.1 fL (ref 80.0–100.0)
MPV: 10.6 fL (ref 7.5–12.5)
Monocytes Relative: 10.3 %
Neutro Abs: 3159 cells/uL (ref 1500–7800)
Neutrophils Relative %: 59.6 %
Platelets: 208 10*3/uL (ref 140–400)
RBC: 4.96 10*6/uL (ref 3.80–5.10)
RDW: 13 % (ref 11.0–15.0)
Total Lymphocyte: 26.5 %
WBC: 5.3 10*3/uL (ref 3.8–10.8)

## 2020-06-16 LAB — HEMOGLOBIN A1C
Hgb A1c MFr Bld: 5.8 % of total Hgb — ABNORMAL HIGH (ref <5.7)
Mean Plasma Glucose: 120 (calc)
eAG (mmol/L): 6.6 (calc)

## 2020-06-16 LAB — VITAMIN B12: Vitamin B-12: 424 pg/mL (ref 200–1100)

## 2020-06-16 LAB — T4, FREE: Free T4: 1 ng/dL (ref 0.8–1.8)

## 2020-06-16 LAB — TSH: TSH: 2.64 mIU/L (ref 0.40–4.50)

## 2020-06-18 DIAGNOSIS — M542 Cervicalgia: Secondary | ICD-10-CM | POA: Diagnosis not present

## 2020-06-19 NOTE — Progress Notes (Signed)
Thyroid in normal range,  Vit d borderline low . Blood sugar stable will arrange for dr Truett Mainland to get the results  Illene Regulus  Fax [phone]

## 2020-06-27 DIAGNOSIS — Z6837 Body mass index (BMI) 37.0-37.9, adult: Secondary | ICD-10-CM | POA: Diagnosis not present

## 2020-10-25 ENCOUNTER — Other Ambulatory Visit: Payer: Self-pay | Admitting: Internal Medicine

## 2020-10-25 DIAGNOSIS — Z1231 Encounter for screening mammogram for malignant neoplasm of breast: Secondary | ICD-10-CM

## 2020-12-03 ENCOUNTER — Other Ambulatory Visit: Payer: Self-pay | Admitting: Internal Medicine

## 2020-12-12 ENCOUNTER — Ambulatory Visit: Payer: BC Managed Care – PPO

## 2021-01-03 ENCOUNTER — Other Ambulatory Visit: Payer: Self-pay

## 2021-01-03 ENCOUNTER — Ambulatory Visit
Admission: RE | Admit: 2021-01-03 | Discharge: 2021-01-03 | Disposition: A | Discharge status: Home / Self Care | Payer: BC Managed Care – PPO | Source: Ambulatory Visit | Attending: Internal Medicine | Admitting: Internal Medicine

## 2021-01-03 DIAGNOSIS — Z1231 Encounter for screening mammogram for malignant neoplasm of breast: Secondary | ICD-10-CM

## 2021-01-09 DIAGNOSIS — Z01818 Encounter for other preprocedural examination: Secondary | ICD-10-CM | POA: Diagnosis not present

## 2021-01-14 DIAGNOSIS — K219 Gastro-esophageal reflux disease without esophagitis: Secondary | ICD-10-CM | POA: Diagnosis not present

## 2021-02-11 DIAGNOSIS — Z1211 Encounter for screening for malignant neoplasm of colon: Secondary | ICD-10-CM | POA: Diagnosis not present

## 2021-02-11 DIAGNOSIS — D123 Benign neoplasm of transverse colon: Secondary | ICD-10-CM | POA: Diagnosis not present

## 2021-02-11 LAB — HM COLONOSCOPY

## 2021-02-12 DIAGNOSIS — E785 Hyperlipidemia, unspecified: Secondary | ICD-10-CM | POA: Diagnosis not present

## 2021-02-12 DIAGNOSIS — R7303 Prediabetes: Secondary | ICD-10-CM | POA: Diagnosis not present

## 2021-02-12 DIAGNOSIS — E042 Nontoxic multinodular goiter: Secondary | ICD-10-CM | POA: Diagnosis not present

## 2021-02-14 DIAGNOSIS — Z6836 Body mass index (BMI) 36.0-36.9, adult: Secondary | ICD-10-CM | POA: Diagnosis not present

## 2021-02-26 ENCOUNTER — Encounter: Payer: Self-pay | Admitting: Internal Medicine

## 2021-02-27 ENCOUNTER — Ambulatory Visit (INDEPENDENT_AMBULATORY_CARE_PROVIDER_SITE_OTHER): Payer: BC Managed Care – PPO | Admitting: Ophthalmology

## 2021-02-27 ENCOUNTER — Encounter (INDEPENDENT_AMBULATORY_CARE_PROVIDER_SITE_OTHER): Payer: Self-pay | Admitting: Ophthalmology

## 2021-02-27 ENCOUNTER — Other Ambulatory Visit: Payer: Self-pay

## 2021-02-27 DIAGNOSIS — H2511 Age-related nuclear cataract, right eye: Secondary | ICD-10-CM | POA: Diagnosis not present

## 2021-02-27 DIAGNOSIS — H35371 Puckering of macula, right eye: Secondary | ICD-10-CM | POA: Diagnosis not present

## 2021-02-27 DIAGNOSIS — H43811 Vitreous degeneration, right eye: Secondary | ICD-10-CM | POA: Diagnosis not present

## 2021-02-27 DIAGNOSIS — H35372 Puckering of macula, left eye: Secondary | ICD-10-CM | POA: Diagnosis not present

## 2021-02-27 NOTE — Progress Notes (Signed)
02/27/2021     CHIEF COMPLAINT Patient presents for Retina Follow Up (1 Year F/U OU//Pt denies noticeable changes to [place] OU since last visit. Pt denies ocular pain, flashes of light, or floaters OU. Pt reports intermittent twitching OS.)   HISTORY OF PRESENT ILLNESS: Alison Matthews is a 65 y.o. female who presents to the clinic today for:   HPI    Retina Follow Up    Diagnosis: Other   Laterality: right eye   Onset: 1 year ago   Severity: mild   Duration: 1 year   Course: stable   Comments: 1 Year F/U OU  Pt denies noticeable changes to [place] OU since last visit. Pt denies ocular pain, flashes of light, or floaters OU. Pt reports intermittent twitching OS.       Last edited by Rockie Neighbours, Louise on 02/27/2021  8:08 AM. (History)      Referring physician: Burnis Medin, MD [place],  [place] [address]  HISTORICAL INFORMATION:   Selected notes from the MEDICAL RECORD NUMBER    Lab Results  Component Value Date   HGBA1C 5.8 (H) 06/15/2020     CURRENT MEDICATIONS: No current outpatient medications on file. (Ophthalmic Drugs)   No current facility-administered medications for this visit. (Ophthalmic Drugs)   Current Outpatient Medications (Other)  Medication Sig  . ascorbic acid (VITAMIN C) 500 MG tablet Take by mouth.  Marland Kitchen aspirin 81 MG tablet Take 81 mg by mouth daily.  . Cholecalciferol 25 MCG (1000 UT) tablet Take by mouth.  . cyanocobalamin 1000 MCG tablet Take by mouth.  . labetalol (NORMODYNE) 100 MG tablet Take 0.5 tablets (50 mg total) by mouth 2 (two) times daily. Please schedule visit with labs for further refills. [phone]  . Liraglutide -Weight Management (SAXENDA) 18 MG/3ML SOPN Saxenda 3 mg/0.5 mL (18 mg/3 mL) subcutaneous pen injector  . methocarbamol (ROBAXIN) 500 MG tablet methocarbamol 500 mg tablet  TAKE 2 TABLETS BY MOUTH FOUR TIMES DAILY  . Multiple Vitamins-Minerals (COMPLETE) TABS Take 1 tablet by mouth daily.  Marland Kitchen  omeprazole (PRILOSEC) 20 MG capsule Take 20 mg by mouth daily. OTC  . rosuvastatin (CRESTOR) 20 MG tablet Take 1 tablet (20 mg total) by mouth daily.   No current facility-administered medications for this visit. (Other)      REVIEW OF SYSTEMS:    ALLERGIES Allergies  Allergen Reactions  . Latex Itching    PAST MEDICAL HISTORY Past Medical History:  Diagnosis Date  . Breast pain    right  . Foot pain    bilateral  . H/O bone density study 2014   nl   . History of ETT    nl low exrcise capacity  . History of PFTs 2014   nl  . 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 Morton's neuroma   . PAF (paroxysmal atrial fibrillation) ([place]) 12/25/2011   During pregnancy 2012, s/p DCCV x 2   . Paroxysmal atrial fibrillation ([place])    During pregnancy 2012, s/p DCCV x 2  . Postmenopausal   . Shoulder pain, left   . Skin cancer of trunk 02/14/2012  . SVT (supraventricular tachycardia) ([place])   . Tingling    Past Surgical History:  Procedure Laterality Date  . CARDIAC CATHETERIZATION  12/24/11   cardiac ablation  . CATARACT EXTRACTION  2/10  . CESAREAN SECTION  10/10  . CESAREAN SECTION  07/14/2011   Procedure: CESAREAN SECTION;  Surgeon: Sharyn Lull  A Horvath;  Location: [facility];  Service: Gynecology;  Laterality: N/A;  Repeat  . HYSTEROSCOPY WITH D & C N/A 01/17/2015   Procedure: DILATATION AND CURETTAGE /HYSTEROSCOPY/POLYPECTOMY;  Surgeon: Bobbye Charleston, MD;  Location: [facility];  Service: Gynecology;  Laterality: N/A;  . macular surgery    . PARS PLANA VITRECTOMY W/ REPAIR OF MACULAR HOLE  12/09  . POLYPECTOMY  9/09   Uterine  . SUPRAVENTRICULAR TACHYCARDIA ABLATION N/A 12/24/2011   Procedure: SUPRAVENTRICULAR TACHYCARDIA ABLATION;  Surgeon: Evans Lance, MD;  Location: [facility] CATH LAB;  Service: Cardiovascular;  Laterality: N/A;  . WISDOM TOOTH EXTRACTION  8/76    FAMILY HISTORY Family History  Problem Relation Age of Onset  . Hypertension Other   . Osteoporosis Other    . Arthritis Other   . Heart disease Father   . Breast cancer Maternal Aunt   . Diabetes Paternal Grandmother   . Colon cancer Neg Hx     SOCIAL HISTORY Social History   Tobacco Use  . Smoking status: Never Smoker  . Smokeless tobacco: Never Used  Vaping Use  . Vaping Use: Never used  Substance Use Topics  . Alcohol use: Yes    Comment:  2 DRINKS A MONTH  . Drug use: No         OPHTHALMIC EXAM:  Base Eye Exam    Visual Acuity (ETDRS)      Right Left   Dist [place] 20/25 +1 20/25       Tonometry (Tonopen, 8:11 AM)      Right Left   Pressure 12 10       Pupils      Pupils Dark Light Shape React APD   Right PERRL 3 2 Round Brisk None   Left PERRL 3 2 Round Brisk None       Visual Fields (Counting fingers)      Left Right    Full Full       Extraocular Movement      Right Left    Full Full       Neuro/Psych    Oriented x3: Yes   Mood/Affect: Normal       Dilation    Both eyes: 1.0% Mydriacyl, 2.5% Phenylephrine @ 8:11 AM        Slit Lamp and Fundus Exam    External Exam      Right Left   External Normal Normal       Slit Lamp Exam      Right Left   Lids/Lashes Normal Normal   Conjunctiva/Sclera White and quiet White and quiet   Cornea Clear Clear   Anterior Chamber Deep and quiet Deep and quiet   Iris Round and reactive Round and reactive   Lens 2+ Nuclear sclerosis  Centered posterior chamber intraocular lens   Anterior Vitreous Normal Normal       Fundus Exam      Right Left   Posterior Vitreous Posterior vitreous detachment VITRECTOMIZED   Disc Normal Normal   C/D Ratio 0.25 0.4   Macula , No topographic distortion, shiny ILM normal Normal   Vessels Normal Normal   Periphery Normal, no holes or tears No holes or tears          IMAGING AND PROCEDURES  Imaging and Procedures for 02/27/21  OCT, Retina - OU - Both Eyes       Right Eye Quality was good. Scan locations included subfoveal. Central Foveal Thickness: 278.  Progression has been  stable. Findings include normal observations.   Left Eye Quality was good. Scan locations included subfoveal. Central Foveal Thickness: 316. Progression has improved. Findings include abnormal foveal contour.   Notes OD, small preretinal at the retinal tissue, not touching the retina thus likely a dehiscence of a small internal limiting membrane.  Small perifoveal floater is noted centrally.  There is no macular hole  OS, status post macular hole repair some 13 years ago.  Stable macular findings.                ASSESSMENT/PLAN:  Posterior vitreous detachment of right eye OD no change over time  Right epiretinal membrane Minor epiretinal membrane seen only on OCT With no macular thickening and no distortion to the fovea  Left epiretinal membrane History of macular hole repair OS  Nuclear sclerotic cataract of right eye The nature of cataract was discussed with the patient as well as the elective nature of surgery. The patient was reassured that surgery at a later date does not put the patient at risk for a worse outcome. It was emphasized that the need for surgery is dictated by the patient's quality of life as influenced by the cataract. Patient was instructed to maintain close follow up with their general eye care doctor.      ICD-10-CM   1. Right epiretinal membrane  H35.371 OCT, Retina - OU - Both Eyes  2. Posterior vitreous detachment of right eye  H43.811   3. Left epiretinal membrane  H35.372   4. Nuclear sclerotic cataract of right eye  H25.11     1.  OU stable, excellent visual acuity.  2.  No significant progression and nuclear sclerotic cataract in the right eye.  3.  No macular distortion in either eye.  Ophthalmic Meds Ordered this visit:  No orders of the defined types were placed in this encounter.      Return in about 1 year (around 02/27/2022) for DILATE OU, OCT.  There are no Patient Instructions on file for this  visit.   Explained the diagnoses, plan, and follow up with the patient and they expressed understanding.  Patient expressed understanding of the importance of proper follow up care.   Clent Demark Everett Ehrler M.D. Diseases & Surgery of the Retina and Vitreous [facility] [place] 02/27/21     Abbreviations: M myopia (nearsighted); A astigmatism; H hyperopia (farsighted); P presbyopia; Mrx spectacle prescription;  CTL contact lenses; OD right eye; OS left eye; OU both eyes  XT exotropia; ET esotropia; PEK punctate epithelial keratitis; PEE punctate epithelial erosions; DES dry eye syndrome; MGD meibomian gland dysfunction; ATs artificial tears; PFAT's preservative free artificial tears; [place] nuclear sclerotic cataract; PSC posterior subcapsular cataract; ERM epi-retinal membrane; PVD posterior vitreous detachment; RD retinal detachment; DM diabetes mellitus; DR diabetic retinopathy; NPDR non-proliferative diabetic retinopathy; PDR proliferative diabetic retinopathy; CSME clinically significant macular edema; DME diabetic macular edema; dbh dot blot hemorrhages; CWS cotton wool spot; POAG primary open angle glaucoma; C/D cup-to-disc ratio; HVF humphrey visual field; GVF goldmann visual field; OCT optical coherence tomography; IOP intraocular pressure; BRVO Branch retinal vein occlusion; CRVO central retinal vein occlusion; CRAO central retinal artery occlusion; BRAO branch retinal artery occlusion; RT retinal tear; SB scleral buckle; PPV pars plana vitrectomy; VH Vitreous hemorrhage; PRP panretinal laser photocoagulation; IVK intravitreal kenalog; VMT vitreomacular traction; MH Macular hole;  NVD neovascularization of the disc; NVE neovascularization elsewhere; AREDS age related eye disease study; ARMD age related macular degeneration; POAG primary open angle glaucoma;  EBMD epithelial/anterior basement membrane dystrophy; ACIOL anterior chamber intraocular lens; IOL intraocular lens; PCIOL posterior chamber  intraocular lens; Phaco/IOL phacoemulsification with intraocular lens placement; [place] photorefractive keratectomy; LASIK laser assisted in situ keratomileusis; HTN hypertension; DM diabetes mellitus; COPD chronic obstructive pulmonary disease

## 2021-02-27 NOTE — Assessment & Plan Note (Signed)
OD no change over time

## 2021-02-27 NOTE — Assessment & Plan Note (Signed)

## 2021-02-27 NOTE — Assessment & Plan Note (Signed)
History of macular hole repair OS

## 2021-02-27 NOTE — Assessment & Plan Note (Signed)
Minor epiretinal membrane seen only on OCT With no macular thickening and no distortion to the fovea

## 2021-03-17 ENCOUNTER — Other Ambulatory Visit: Payer: Self-pay | Admitting: Internal Medicine

## 2021-05-20 DIAGNOSIS — M25561 Pain in right knee: Secondary | ICD-10-CM | POA: Diagnosis not present

## 2021-05-24 DIAGNOSIS — M25561 Pain in right knee: Secondary | ICD-10-CM | POA: Diagnosis not present

## 2021-05-27 DIAGNOSIS — M25561 Pain in right knee: Secondary | ICD-10-CM | POA: Diagnosis not present

## 2021-05-31 DIAGNOSIS — M25561 Pain in right knee: Secondary | ICD-10-CM | POA: Diagnosis not present

## 2021-05-31 DIAGNOSIS — M6281 Muscle weakness (generalized): Secondary | ICD-10-CM | POA: Diagnosis not present

## 2021-05-31 DIAGNOSIS — M25661 Stiffness of right knee, not elsewhere classified: Secondary | ICD-10-CM | POA: Diagnosis not present

## 2021-05-31 DIAGNOSIS — S83281D Other tear of lateral meniscus, current injury, right knee, subsequent encounter: Secondary | ICD-10-CM | POA: Diagnosis not present

## 2021-06-05 DIAGNOSIS — D225 Melanocytic nevi of trunk: Secondary | ICD-10-CM | POA: Diagnosis not present

## 2021-06-05 DIAGNOSIS — D2262 Melanocytic nevi of left upper limb, including shoulder: Secondary | ICD-10-CM | POA: Diagnosis not present

## 2021-06-05 DIAGNOSIS — L820 Inflamed seborrheic keratosis: Secondary | ICD-10-CM | POA: Diagnosis not present

## 2021-06-05 DIAGNOSIS — Z85828 Personal history of other malignant neoplasm of skin: Secondary | ICD-10-CM | POA: Diagnosis not present

## 2021-06-05 DIAGNOSIS — D2261 Melanocytic nevi of right upper limb, including shoulder: Secondary | ICD-10-CM | POA: Diagnosis not present

## 2021-06-19 DIAGNOSIS — E6609 Other obesity due to excess calories: Secondary | ICD-10-CM | POA: Diagnosis not present

## 2021-06-19 DIAGNOSIS — Z6833 Body mass index (BMI) 33.0-33.9, adult: Secondary | ICD-10-CM | POA: Diagnosis not present

## 2021-06-26 DIAGNOSIS — G8918 Other acute postprocedural pain: Secondary | ICD-10-CM | POA: Diagnosis not present

## 2021-06-26 DIAGNOSIS — X58XXXA Exposure to other specified factors, initial encounter: Secondary | ICD-10-CM | POA: Diagnosis not present

## 2021-06-26 DIAGNOSIS — S83231A Complex tear of medial meniscus, current injury, right knee, initial encounter: Secondary | ICD-10-CM | POA: Diagnosis not present

## 2021-06-26 DIAGNOSIS — Y999 Unspecified external cause status: Secondary | ICD-10-CM | POA: Diagnosis not present

## 2021-06-26 DIAGNOSIS — S83281A Other tear of lateral meniscus, current injury, right knee, initial encounter: Secondary | ICD-10-CM | POA: Diagnosis not present

## 2021-06-26 DIAGNOSIS — M1711 Unilateral primary osteoarthritis, right knee: Secondary | ICD-10-CM | POA: Diagnosis not present

## 2021-07-01 DIAGNOSIS — S83281D Other tear of lateral meniscus, current injury, right knee, subsequent encounter: Secondary | ICD-10-CM | POA: Diagnosis not present

## 2021-07-01 DIAGNOSIS — M25661 Stiffness of right knee, not elsewhere classified: Secondary | ICD-10-CM | POA: Diagnosis not present

## 2021-07-01 DIAGNOSIS — M6281 Muscle weakness (generalized): Secondary | ICD-10-CM | POA: Diagnosis not present

## 2021-07-01 DIAGNOSIS — M25561 Pain in right knee: Secondary | ICD-10-CM | POA: Diagnosis not present

## 2021-07-04 DIAGNOSIS — S83281D Other tear of lateral meniscus, current injury, right knee, subsequent encounter: Secondary | ICD-10-CM | POA: Diagnosis not present

## 2021-07-15 DIAGNOSIS — M25561 Pain in right knee: Secondary | ICD-10-CM | POA: Diagnosis not present

## 2021-07-15 DIAGNOSIS — M25661 Stiffness of right knee, not elsewhere classified: Secondary | ICD-10-CM | POA: Diagnosis not present

## 2021-07-15 DIAGNOSIS — M6281 Muscle weakness (generalized): Secondary | ICD-10-CM | POA: Diagnosis not present

## 2021-07-15 DIAGNOSIS — S83281D Other tear of lateral meniscus, current injury, right knee, subsequent encounter: Secondary | ICD-10-CM | POA: Diagnosis not present

## 2021-07-25 DIAGNOSIS — M545 Low back pain, unspecified: Secondary | ICD-10-CM | POA: Diagnosis not present

## 2021-07-25 DIAGNOSIS — M25561 Pain in right knee: Secondary | ICD-10-CM | POA: Diagnosis not present

## 2021-07-29 DIAGNOSIS — S83281D Other tear of lateral meniscus, current injury, right knee, subsequent encounter: Secondary | ICD-10-CM | POA: Diagnosis not present

## 2021-07-29 DIAGNOSIS — M25561 Pain in right knee: Secondary | ICD-10-CM | POA: Diagnosis not present

## 2021-07-29 DIAGNOSIS — M25661 Stiffness of right knee, not elsewhere classified: Secondary | ICD-10-CM | POA: Diagnosis not present

## 2021-07-29 DIAGNOSIS — M6281 Muscle weakness (generalized): Secondary | ICD-10-CM | POA: Diagnosis not present

## 2021-08-06 DIAGNOSIS — M545 Low back pain, unspecified: Secondary | ICD-10-CM | POA: Diagnosis not present

## 2021-08-20 DIAGNOSIS — S83281D Other tear of lateral meniscus, current injury, right knee, subsequent encounter: Secondary | ICD-10-CM | POA: Diagnosis not present

## 2021-08-20 DIAGNOSIS — M6281 Muscle weakness (generalized): Secondary | ICD-10-CM | POA: Diagnosis not present

## 2021-08-20 DIAGNOSIS — M25561 Pain in right knee: Secondary | ICD-10-CM | POA: Diagnosis not present

## 2021-08-20 DIAGNOSIS — M25661 Stiffness of right knee, not elsewhere classified: Secondary | ICD-10-CM | POA: Diagnosis not present

## 2021-08-22 DIAGNOSIS — M545 Low back pain, unspecified: Secondary | ICD-10-CM | POA: Diagnosis not present

## 2021-09-09 DIAGNOSIS — M48061 Spinal stenosis, lumbar region without neurogenic claudication: Secondary | ICD-10-CM | POA: Diagnosis not present

## 2021-09-09 DIAGNOSIS — M542 Cervicalgia: Secondary | ICD-10-CM | POA: Diagnosis not present

## 2021-10-29 DIAGNOSIS — L578 Other skin changes due to chronic exposure to nonionizing radiation: Secondary | ICD-10-CM | POA: Diagnosis not present

## 2021-10-29 DIAGNOSIS — Z85828 Personal history of other malignant neoplasm of skin: Secondary | ICD-10-CM | POA: Diagnosis not present

## 2021-12-09 DIAGNOSIS — M79675 Pain in left toe(s): Secondary | ICD-10-CM | POA: Diagnosis not present

## 2021-12-09 DIAGNOSIS — M25561 Pain in right knee: Secondary | ICD-10-CM | POA: Diagnosis not present

## 2021-12-10 DIAGNOSIS — Z780 Asymptomatic menopausal state: Secondary | ICD-10-CM | POA: Diagnosis not present

## 2021-12-17 DIAGNOSIS — M25561 Pain in right knee: Secondary | ICD-10-CM | POA: Diagnosis not present

## 2021-12-19 DIAGNOSIS — M25561 Pain in right knee: Secondary | ICD-10-CM | POA: Diagnosis not present

## 2021-12-31 DIAGNOSIS — E6609 Other obesity due to excess calories: Secondary | ICD-10-CM | POA: Diagnosis not present

## 2021-12-31 DIAGNOSIS — N952 Postmenopausal atrophic vaginitis: Secondary | ICD-10-CM | POA: Diagnosis not present

## 2021-12-31 DIAGNOSIS — E559 Vitamin D deficiency, unspecified: Secondary | ICD-10-CM | POA: Diagnosis not present

## 2021-12-31 DIAGNOSIS — N888 Other specified noninflammatory disorders of cervix uteri: Secondary | ICD-10-CM | POA: Diagnosis not present

## 2021-12-31 DIAGNOSIS — E039 Hypothyroidism, unspecified: Secondary | ICD-10-CM | POA: Diagnosis not present

## 2021-12-31 DIAGNOSIS — Z6833 Body mass index (BMI) 33.0-33.9, adult: Secondary | ICD-10-CM | POA: Diagnosis not present

## 2021-12-31 DIAGNOSIS — Z124 Encounter for screening for malignant neoplasm of cervix: Secondary | ICD-10-CM | POA: Diagnosis not present

## 2021-12-31 DIAGNOSIS — K219 Gastro-esophageal reflux disease without esophagitis: Secondary | ICD-10-CM | POA: Diagnosis not present

## 2021-12-31 DIAGNOSIS — E785 Hyperlipidemia, unspecified: Secondary | ICD-10-CM | POA: Diagnosis not present

## 2021-12-31 DIAGNOSIS — R87616 Satisfactory cervical smear but lacking transformation zone: Secondary | ICD-10-CM | POA: Diagnosis not present

## 2021-12-31 DIAGNOSIS — Z0000 Encounter for general adult medical examination without abnormal findings: Secondary | ICD-10-CM | POA: Diagnosis not present

## 2021-12-31 DIAGNOSIS — R7303 Prediabetes: Secondary | ICD-10-CM | POA: Diagnosis not present

## 2022-01-21 ENCOUNTER — Other Ambulatory Visit: Payer: Self-pay | Admitting: Internal Medicine

## 2022-01-21 DIAGNOSIS — Z1231 Encounter for screening mammogram for malignant neoplasm of breast: Secondary | ICD-10-CM

## 2022-02-04 ENCOUNTER — Ambulatory Visit
Admission: RE | Admit: 2022-02-04 | Discharge: 2022-02-04 | Disposition: A | Discharge status: Home / Self Care | Payer: BC Managed Care – PPO | Source: Ambulatory Visit | Attending: Internal Medicine | Admitting: Internal Medicine

## 2022-02-04 ENCOUNTER — Ambulatory Visit: Payer: BC Managed Care – PPO

## 2022-02-04 DIAGNOSIS — Z1231 Encounter for screening mammogram for malignant neoplasm of breast: Secondary | ICD-10-CM

## 2022-02-26 ENCOUNTER — Encounter (INDEPENDENT_AMBULATORY_CARE_PROVIDER_SITE_OTHER): Payer: Self-pay | Admitting: Ophthalmology

## 2022-02-26 ENCOUNTER — Ambulatory Visit (INDEPENDENT_AMBULATORY_CARE_PROVIDER_SITE_OTHER): Payer: BC Managed Care – PPO | Admitting: Ophthalmology

## 2022-02-26 DIAGNOSIS — H35373 Puckering of macula, bilateral: Secondary | ICD-10-CM | POA: Diagnosis not present

## 2022-02-26 DIAGNOSIS — H2511 Age-related nuclear cataract, right eye: Secondary | ICD-10-CM

## 2022-02-26 DIAGNOSIS — H35371 Puckering of macula, right eye: Secondary | ICD-10-CM | POA: Diagnosis not present

## 2022-02-26 DIAGNOSIS — H35342 Macular cyst, hole, or pseudohole, left eye: Secondary | ICD-10-CM | POA: Diagnosis not present

## 2022-02-26 DIAGNOSIS — H35372 Puckering of macula, left eye: Secondary | ICD-10-CM | POA: Diagnosis not present

## 2022-02-26 NOTE — Assessment & Plan Note (Signed)
Changes from prior macular hole repair, with excellent acuity

## 2022-02-26 NOTE — Assessment & Plan Note (Signed)
Mild OD, no significant progression

## 2022-02-26 NOTE — Assessment & Plan Note (Signed)
Minor epiretinal tissue, but no distorting features OD.  For this reason will asked patient to follow-up with regular eye doctor, Dr. Annia Belt and here in 2 to 3 years

## 2022-02-26 NOTE — Progress Notes (Signed)
02/26/2022     CHIEF COMPLAINT Patient presents for  Chief Complaint  Patient presents with   Retina Follow Up      HISTORY OF PRESENT ILLNESS: Alison Matthews is a 66 y.o. female who presents to the clinic today for:   HPI     Retina Follow Up           Diagnosis: Other (ERM)   Laterality: right eye   Onset: 1 year ago         Comments   1 yr fu OU oct. Patient states vision is stable and unchanged since last visit. Denies any new floaters or FOL.        Last edited by Laurin Coder on 02/26/2022  8:03 AM.      Referring physician: Burnis Medin, MD [address],  [place] [address]  HISTORICAL INFORMATION:   Selected notes from the MEDICAL RECORD NUMBER    Lab Results  Component Value Date   HGBA1C 5.8 (H) 06/15/2020     CURRENT MEDICATIONS: No current outpatient medications on file. (Ophthalmic Drugs)   No current facility-administered medications for this visit. (Ophthalmic Drugs)   Current Outpatient Medications (Other)  Medication Sig   ascorbic acid (VITAMIN C) 500 MG tablet Take by mouth.   aspirin 81 MG tablet Take 81 mg by mouth daily.   Cholecalciferol 25 MCG (1000 UT) tablet Take by mouth.   cyanocobalamin 1000 MCG tablet Take by mouth.   labetalol (NORMODYNE) 100 MG tablet TAKE ONE-HALF TABLET(50 MG) BY MOUTH TWICE DAILY   Liraglutide -Weight Management (SAXENDA) 18 MG/3ML SOPN Saxenda 3 mg/0.5 mL (18 mg/3 mL) subcutaneous pen injector   methocarbamol (ROBAXIN) 500 MG tablet methocarbamol 500 mg tablet  TAKE 2 TABLETS BY MOUTH FOUR TIMES DAILY   Multiple Vitamins-Minerals (COMPLETE) TABS Take 1 tablet by mouth daily.   omeprazole (PRILOSEC) 20 MG capsule Take 20 mg by mouth daily. OTC   rosuvastatin (CRESTOR) 20 MG tablet Take 1 tablet (20 mg total) by mouth daily.   No current facility-administered medications for this visit. (Other)      REVIEW OF SYSTEMS: ROS   Negative for: Constitutional,  Gastrointestinal, Neurological, Skin, Genitourinary, Musculoskeletal, HENT, Endocrine, Cardiovascular, Eyes, Respiratory, Psychiatric, Allergic/Imm, Heme/Lymph Last edited by Hurman Horn, MD on 02/26/2022  8:41 AM.       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   Morton's neuroma    PAF (paroxysmal atrial fibrillation) ([place]) 12/25/2011   During pregnancy 2012, s/p DCCV x 2    Paroxysmal atrial fibrillation ([place])    During pregnancy 2012, s/p DCCV x 2   Postmenopausal    Shoulder pain, left    Skin cancer of trunk 02/14/2012   SVT (supraventricular tachycardia) ([place])    Tingling    Past Surgical History:  Procedure Laterality Date   CARDIAC CATHETERIZATION  12/24/11   cardiac ablation   CATARACT EXTRACTION  2/10   CESAREAN SECTION  10/10   CESAREAN SECTION  07/14/2011   Procedure: CESAREAN SECTION;  Surgeon: Sharyn Lull  A Horvath;  Location: [facility];  Service: Gynecology;  Laterality: N/A;  Repeat   HYSTEROSCOPY WITH D & C N/A 01/17/2015   Procedure: DILATATION AND CURETTAGE /HYSTEROSCOPY/POLYPECTOMY;  Surgeon: Bobbye Charleston, MD;  Location: [facility];  Service: Gynecology;  Laterality: N/A;   macular surgery     PARS PLANA VITRECTOMY W/ REPAIR OF MACULAR HOLE  12/09   POLYPECTOMY  9/09   Uterine   SUPRAVENTRICULAR TACHYCARDIA ABLATION N/A 12/24/2011   Procedure: SUPRAVENTRICULAR TACHYCARDIA ABLATION;  Surgeon: Evans Lance, MD;  Location: [facility] C[facility];  Service: Cardiovascular;  Laterality: N/A;   WISDOM TOOTH EXTRACTION  8/76    FAMILY HISTORY Family History  Problem Relation Age of Onset   Hypertension Other    Osteoporosis Other    Arthritis Other    Heart disease Father    Breast  cancer Maternal Aunt    Diabetes Paternal Grandmother    Colon cancer Neg Hx     SOCIAL HISTORY Social History   Tobacco Use   Smoking status: Never   Smokeless tobacco: Never  Vaping Use   Vaping Use: Never used  Substance Use Topics   Alcohol use: Yes    Comment:  2 DRINKS A MONTH   Drug use: No         OPHTHALMIC EXAM:  Base Eye Exam     Visual Acuity (ETDRS)       Right Left   Dist [place] 20/25 -1+2 20/25         Tonometry (Tonopen, 8:04 AM)       Right Left   Pressure 5 5         Pupils       Pupils Dark Light APD   Right PERRL 3 2 None   Left PERRL 3 2 None         Extraocular Movement       Right Left    Full Full         Neuro/Psych     Oriented x3: Yes   Mood/Affect: Normal         Dilation     Both eyes: 1.0% Mydriacyl, 2.5% Phenylephrine @ 8:04 AM           Slit Lamp and Fundus Exam     External Exam       Right Left   External Normal Normal         Slit Lamp Exam       Right Left   Lids/Lashes Normal Normal   Conjunctiva/Sclera White and quiet White and quiet   Cornea Clear Clear   Anterior Chamber Deep and quiet Deep and quiet   Iris Round and reactive Round and reactive   Lens 2+ Nuclear sclerosis  Centered posterior chamber intraocular lens   Anterior Vitreous Normal Normal         Fundus Exam       Right Left   Posterior Vitreous Posterior vitreous detachment VITRECTOMIZED   Disc Normal Normal   C/D Ratio 0.25 0.4   Macula , No topographic distortion, shiny ILM normal Normal   Vessels Normal Normal   Periphery Normal, no holes or tears No holes or tears            IMAGING AND PROCEDURES  Imaging and Procedures for 02/26/22  OCT, Retina - OU - Both Eyes       Right Eye Quality was good. Scan locations included subfoveal. Central Foveal Thickness: 273. Progression has been stable.  Findings include normal observations.   Left Eye Quality was good. Scan locations included subfoveal.  Central Foveal Thickness: 313. Progression has improved. Findings include abnormal foveal contour.   Notes OD, small preretinal at the retinal tissue, not touching the retina thus likely a dehiscence of a small internal limiting membrane.  Small perifoveal floater is noted centrally.  There is no macular hole  OS, status post macular hole repair some 14 years ago.  Stable macular findings.             ASSESSMENT/PLAN:  Right epiretinal membrane Minor epiretinal tissue, but no distorting features OD.  For this reason will asked patient to follow-up with regular eye doctor, Dr. Annia Belt and here in 2 to 3 years  Nuclear sclerotic cataract of right eye Mild OD, no significant progression  Left epiretinal membrane History of macular hole repair OS, stable  Macular pseudohole of left eye Changes from prior macular hole repair, with excellent acuity     ICD-10-CM   1. Right epiretinal membrane  H35.371 OCT, Retina - OU - Both Eyes    2. Nuclear sclerotic cataract of right eye  H25.11     3. Left epiretinal membrane  H35.372     4. Macular pseudohole of left eye  H35.342       1.  OD, minor epiretinal membrane but no interval change of the year of year and over the last several years.  For this reason we will asked patient to follow-up with her regular eye physician, Dr. Annia Belt and here in every 3 years  2.  OS, history of macular hole and closure.  Great acuity remains.  3.  Ophthalmic Meds Ordered this visit:  No orders of the defined types were placed in this encounter.      No follow-ups on file.  There are no Patient Instructions on file for this visit.   Explained the diagnoses, plan, and follow up with the patient and they expressed understanding.  Patient expressed understanding of the importance of proper follow up care.   Clent Demark Malayah Demuro M.D. Diseases & Surgery of the Retina and Vitreous [facility] [place] 02/26/22     Abbreviations: M myopia (nearsighted); A astigmatism; H hyperopia (farsighted); P presbyopia; Mrx spectacle prescription;  CTL contact lenses; OD right eye; OS left eye; OU both eyes  XT exotropia; ET esotropia; PEK punctate epithelial keratitis; PEE punctate epithelial erosions; DES dry eye syndrome; MGD meibomian gland dysfunction; ATs artificial tears; PFAT's preservative free artificial tears; [place] nuclear sclerotic cataract; PSC posterior subcapsular cataract; ERM epi-retinal membrane; PVD posterior vitreous detachment; RD retinal detachment; DM diabetes mellitus; DR diabetic retinopathy; NPDR non-proliferative diabetic retinopathy; PDR proliferative diabetic retinopathy; CSME clinically significant macular edema; DME diabetic macular edema; dbh dot blot hemorrhages; CWS cotton wool spot; POAG primary open angle glaucoma; C/D cup-to-disc ratio; HVF humphrey visual field; GVF goldmann visual field; OCT optical coherence tomography; IOP intraocular pressure; BRVO Branch retinal vein occlusion; CRVO central retinal vein occlusion; CRAO central retinal artery occlusion; BRAO branch retinal artery occlusion; RT retinal tear; SB scleral buckle; PPV pars plana vitrectomy; VH Vitreous hemorrhage; PRP panretinal laser photocoagulation; IVK intravitreal kenalog; VMT vitreomacular traction; MH Macular hole;  NVD neovascularization of the disc; NVE neovascularization elsewhere; AREDS age related eye disease study; ARMD age related macular degeneration; POAG primary open angle glaucoma; EBMD epithelial/anterior basement membrane dystrophy; ACIOL anterior chamber intraocular lens; IOL intraocular lens; PCIOL posterior chamber intraocular lens; Phaco/IOL phacoemulsification with intraocular lens  placement; [place] photorefractive keratectomy; LASIK laser assisted in situ keratomileusis; HTN hypertension; DM diabetes mellitus; COPD chronic obstructive pulmonary disease

## 2022-02-26 NOTE — Assessment & Plan Note (Signed)
History of macular hole repair OS, stable

## 2022-03-04 ENCOUNTER — Encounter (INDEPENDENT_AMBULATORY_CARE_PROVIDER_SITE_OTHER): Payer: BC Managed Care – PPO | Admitting: Ophthalmology

## 2022-03-10 DIAGNOSIS — M25561 Pain in right knee: Secondary | ICD-10-CM | POA: Diagnosis not present

## 2022-03-11 ENCOUNTER — Encounter (INDEPENDENT_AMBULATORY_CARE_PROVIDER_SITE_OTHER): Payer: BC Managed Care – PPO | Admitting: Ophthalmology

## 2022-06-17 DIAGNOSIS — D2261 Melanocytic nevi of right upper limb, including shoulder: Secondary | ICD-10-CM | POA: Diagnosis not present

## 2022-06-17 DIAGNOSIS — D2262 Melanocytic nevi of left upper limb, including shoulder: Secondary | ICD-10-CM | POA: Diagnosis not present

## 2022-06-17 DIAGNOSIS — D2271 Melanocytic nevi of right lower limb, including hip: Secondary | ICD-10-CM | POA: Diagnosis not present

## 2022-06-17 DIAGNOSIS — Z85828 Personal history of other malignant neoplasm of skin: Secondary | ICD-10-CM | POA: Diagnosis not present

## 2022-07-23 DIAGNOSIS — H35373 Puckering of macula, bilateral: Secondary | ICD-10-CM | POA: Diagnosis not present

## 2022-07-23 DIAGNOSIS — H26492 Other secondary cataract, left eye: Secondary | ICD-10-CM | POA: Diagnosis not present

## 2022-07-23 DIAGNOSIS — H04123 Dry eye syndrome of bilateral lacrimal glands: Secondary | ICD-10-CM | POA: Diagnosis not present

## 2022-07-23 DIAGNOSIS — Z961 Presence of intraocular lens: Secondary | ICD-10-CM | POA: Diagnosis not present

## 2022-09-01 DIAGNOSIS — M545 Low back pain, unspecified: Secondary | ICD-10-CM | POA: Diagnosis not present

## 2022-09-01 DIAGNOSIS — Z23 Encounter for immunization: Secondary | ICD-10-CM | POA: Diagnosis not present

## 2022-09-01 DIAGNOSIS — R7303 Prediabetes: Secondary | ICD-10-CM | POA: Diagnosis not present

## 2022-09-01 DIAGNOSIS — Z2911 Encounter for prophylactic immunotherapy for respiratory syncytial virus (RSV): Secondary | ICD-10-CM | POA: Diagnosis not present

## 2022-09-01 DIAGNOSIS — E669 Obesity, unspecified: Secondary | ICD-10-CM | POA: Diagnosis not present

## 2022-09-03 ENCOUNTER — Ambulatory Visit (INDEPENDENT_AMBULATORY_CARE_PROVIDER_SITE_OTHER): Payer: BC Managed Care – PPO | Admitting: Family Medicine

## 2022-09-03 VITALS — BP 126/80 | HR 67 | Temp 98.0°F | Wt 209.0 lb

## 2022-09-03 DIAGNOSIS — S39012D Strain of muscle, fascia and tendon of lower back, subsequent encounter: Secondary | ICD-10-CM | POA: Diagnosis not present

## 2022-09-03 MED ORDER — METHYLPREDNISOLONE 4 MG PO TBPK: ORAL_TABLET | ORAL | 0 refills | Status: AC

## 2022-09-03 MED ORDER — METHOCARBAMOL 750 MG PO TABS: 750.0000 mg | ORAL_TABLET | Freq: Four times a day (QID) | ORAL | 0 refills | Status: AC | PRN

## 2022-09-03 NOTE — Progress Notes (Signed)
   Subjective:    Patient ID: Alison Matthews, female    DOB: July 14, 1956, 66 y.o.   MRN: [id number]  HPI Here to check her lower back after a fall at home 2 weeks ago. She was trying to ,move a heavy table top and it caused her to lose her balance. As she fell she twisted her body and landed on her right lower back. Since then she has had pain in the right lower back and right buttock. No pain doen the legs. She has been applying heat and taking Tylenol and Ibuprofen with little relief.    Review of Systems  Constitutional: Negative.   Respiratory: Negative.    Cardiovascular: Negative.   Musculoskeletal:  Positive for back pain.       Objective:   Physical Exam Constitutional:      Appearance: Normal appearance.     Comments: She walks easily   Cardiovascular:     Rate and Rhythm: Normal rate and regular rhythm.     Pulses: Normal pulses.     Heart sounds: Normal heart sounds.  Pulmonary:     Effort: Pulmonary effort is normal.     Breath sounds: Normal breath sounds.  Musculoskeletal:     Comments: She is tender over the right lower back and the right side of her sacrum. The spine has full ROM but she has pain on rotation  Neurological:     Mental Status: She is alert.           Assessment & Plan:  Lumbar strain. She can apply heat as needed. Treat with a Medrol dose pack and Robaxin as needed. Recheck as needed. Alysia Penna, MD

## 2022-09-18 DIAGNOSIS — M545 Low back pain, unspecified: Secondary | ICD-10-CM | POA: Diagnosis not present

## 2022-09-18 DIAGNOSIS — M25521 Pain in right elbow: Secondary | ICD-10-CM | POA: Diagnosis not present

## 2022-09-19 DIAGNOSIS — M6281 Muscle weakness (generalized): Secondary | ICD-10-CM | POA: Diagnosis not present

## 2022-09-19 DIAGNOSIS — S29012D Strain of muscle and tendon of back wall of thorax, subsequent encounter: Secondary | ICD-10-CM | POA: Diagnosis not present

## 2022-09-30 DIAGNOSIS — M6281 Muscle weakness (generalized): Secondary | ICD-10-CM | POA: Diagnosis not present

## 2022-09-30 DIAGNOSIS — S29012D Strain of muscle and tendon of back wall of thorax, subsequent encounter: Secondary | ICD-10-CM | POA: Diagnosis not present

## 2022-10-07 DIAGNOSIS — S29012D Strain of muscle and tendon of back wall of thorax, subsequent encounter: Secondary | ICD-10-CM | POA: Diagnosis not present

## 2022-10-07 DIAGNOSIS — M6281 Muscle weakness (generalized): Secondary | ICD-10-CM | POA: Diagnosis not present

## 2022-10-07 DIAGNOSIS — M545 Low back pain, unspecified: Secondary | ICD-10-CM | POA: Diagnosis not present

## 2022-10-16 DIAGNOSIS — M545 Low back pain, unspecified: Secondary | ICD-10-CM | POA: Diagnosis not present

## 2023-03-03 ENCOUNTER — Encounter (INDEPENDENT_AMBULATORY_CARE_PROVIDER_SITE_OTHER): Payer: BC Managed Care – PPO | Admitting: Ophthalmology

## 2023-03-19 DIAGNOSIS — H35373 Puckering of macula, bilateral: Secondary | ICD-10-CM | POA: Diagnosis not present

## 2023-03-19 DIAGNOSIS — H2511 Age-related nuclear cataract, right eye: Secondary | ICD-10-CM | POA: Diagnosis not present

## 2023-06-30 DIAGNOSIS — Z85828 Personal history of other malignant neoplasm of skin: Secondary | ICD-10-CM | POA: Diagnosis not present

## 2023-06-30 DIAGNOSIS — D225 Melanocytic nevi of trunk: Secondary | ICD-10-CM | POA: Diagnosis not present

## 2023-06-30 DIAGNOSIS — D2262 Melanocytic nevi of left upper limb, including shoulder: Secondary | ICD-10-CM | POA: Diagnosis not present

## 2023-06-30 DIAGNOSIS — D2261 Melanocytic nevi of right upper limb, including shoulder: Secondary | ICD-10-CM | POA: Diagnosis not present

## 2023-08-05 DIAGNOSIS — Z23 Encounter for immunization: Secondary | ICD-10-CM | POA: Diagnosis not present

## 2023-08-05 DIAGNOSIS — R931 Abnormal findings on diagnostic imaging of heart and coronary circulation: Secondary | ICD-10-CM | POA: Diagnosis not present

## 2023-08-05 DIAGNOSIS — Z0000 Encounter for general adult medical examination without abnormal findings: Secondary | ICD-10-CM | POA: Diagnosis not present

## 2023-08-05 DIAGNOSIS — E785 Hyperlipidemia, unspecified: Secondary | ICD-10-CM | POA: Diagnosis not present

## 2023-08-21 ENCOUNTER — Other Ambulatory Visit: Payer: Self-pay | Admitting: Internal Medicine

## 2023-08-21 DIAGNOSIS — Z1231 Encounter for screening mammogram for malignant neoplasm of breast: Secondary | ICD-10-CM

## 2023-08-26 ENCOUNTER — Ambulatory Visit: Payer: BC Managed Care – PPO

## 2023-09-24 ENCOUNTER — Ambulatory Visit: Payer: BC Managed Care – PPO

## 2023-10-16 ENCOUNTER — Ambulatory Visit: Payer: BC Managed Care – PPO

## 2023-10-20 ENCOUNTER — Ambulatory Visit
Admission: RE | Admit: 2023-10-20 | Discharge: 2023-10-20 | Disposition: A | Discharge status: Home / Self Care | Payer: BC Managed Care – PPO | Source: Ambulatory Visit | Attending: Internal Medicine | Admitting: Internal Medicine

## 2023-10-20 DIAGNOSIS — Z1231 Encounter for screening mammogram for malignant neoplasm of breast: Secondary | ICD-10-CM
